# Patient Record
Sex: Female | Born: 1950
Health system: Southern US, Community
[De-identification: ages and names within clinical notes are randomized; demographics above are authoritative.]

## PROBLEM LIST (undated history)

## (undated) DIAGNOSIS — H269 Unspecified cataract: Secondary | ICD-10-CM

## (undated) DIAGNOSIS — F419 Anxiety disorder, unspecified: Secondary | ICD-10-CM

## (undated) DIAGNOSIS — K219 Gastro-esophageal reflux disease without esophagitis: Secondary | ICD-10-CM

## (undated) DIAGNOSIS — F329 Major depressive disorder, single episode, unspecified: Secondary | ICD-10-CM

## (undated) DIAGNOSIS — E079 Disorder of thyroid, unspecified: Secondary | ICD-10-CM

## (undated) DIAGNOSIS — K5790 Diverticulosis of intestine, part unspecified, without perforation or abscess without bleeding: Secondary | ICD-10-CM

## (undated) DIAGNOSIS — H04129 Dry eye syndrome of unspecified lacrimal gland: Secondary | ICD-10-CM

## (undated) DIAGNOSIS — D099 Carcinoma in situ, unspecified: Secondary | ICD-10-CM

## (undated) DIAGNOSIS — E042 Nontoxic multinodular goiter: Secondary | ICD-10-CM

## (undated) DIAGNOSIS — I8390 Asymptomatic varicose veins of unspecified lower extremity: Secondary | ICD-10-CM

## (undated) DIAGNOSIS — E039 Hypothyroidism, unspecified: Secondary | ICD-10-CM

## (undated) DIAGNOSIS — I10 Essential (primary) hypertension: Secondary | ICD-10-CM

## (undated) DIAGNOSIS — M1990 Unspecified osteoarthritis, unspecified site: Secondary | ICD-10-CM

## (undated) DIAGNOSIS — E785 Hyperlipidemia, unspecified: Secondary | ICD-10-CM

## (undated) DIAGNOSIS — Z86010 Personal history of colonic polyps: Secondary | ICD-10-CM

## (undated) DIAGNOSIS — D369 Benign neoplasm, unspecified site: Secondary | ICD-10-CM

## (undated) DIAGNOSIS — Z9289 Personal history of other medical treatment: Secondary | ICD-10-CM

## (undated) DIAGNOSIS — G4733 Obstructive sleep apnea (adult) (pediatric): Secondary | ICD-10-CM

## (undated) DIAGNOSIS — F32A Depression, unspecified: Secondary | ICD-10-CM

## (undated) HISTORY — DX: Hyperlipidemia, unspecified: E78.5

## (undated) HISTORY — PX: BREAST EXCISIONAL BIOPSY: SUR124

## (undated) HISTORY — DX: Asymptomatic varicose veins of unspecified lower extremity: I83.90

## (undated) HISTORY — PX: SEPTOPLASTY: SUR1290

## (undated) HISTORY — DX: Disorder of thyroid, unspecified: E07.9

## (undated) HISTORY — DX: Obstructive sleep apnea (adult) (pediatric): G47.33

## (undated) HISTORY — DX: Carcinoma in situ, unspecified: D09.9

## (undated) HISTORY — DX: Diverticulosis of intestine, part unspecified, without perforation or abscess without bleeding: K57.90

## (undated) HISTORY — DX: Hypothyroidism, unspecified: E03.9

## (undated) HISTORY — DX: Benign neoplasm, unspecified site: D36.9

## (undated) HISTORY — DX: Gastro-esophageal reflux disease without esophagitis: K21.9

## (undated) HISTORY — DX: Dry eye syndrome of unspecified lacrimal gland: H04.129

## (undated) HISTORY — PX: OTHER SURGICAL HISTORY: SHX169

## (undated) HISTORY — DX: Major depressive disorder, single episode, unspecified: F32.9

## (undated) HISTORY — PX: EYE SURGERY: SHX253

## (undated) HISTORY — DX: Depression, unspecified: F32.A

## (undated) HISTORY — DX: Unspecified cataract: H26.9

## (undated) HISTORY — DX: Anxiety disorder, unspecified: F41.9

## (undated) HISTORY — DX: Nontoxic multinodular goiter: E04.2

## (undated) HISTORY — DX: Personal history of colonic polyps: Z86.010

---

## 1999-08-14 ENCOUNTER — Emergency Department (HOSPITAL_COMMUNITY): Admission: EM | Admit: 1999-08-14 | Discharge: 1999-08-15 | Payer: Self-pay | Admitting: Emergency Medicine

## 2000-08-02 ENCOUNTER — Encounter: Payer: Self-pay | Admitting: Obstetrics and Gynecology

## 2000-08-02 ENCOUNTER — Ambulatory Visit (HOSPITAL_COMMUNITY): Admission: RE | Admit: 2000-08-02 | Discharge: 2000-08-02 | Payer: Self-pay | Admitting: Obstetrics and Gynecology

## 2001-10-01 HISTORY — PX: CHOLECYSTECTOMY: SHX55

## 2002-05-07 ENCOUNTER — Ambulatory Visit (HOSPITAL_COMMUNITY): Admission: RE | Admit: 2002-05-07 | Discharge: 2002-05-07 | Payer: Self-pay | Admitting: Obstetrics and Gynecology

## 2002-05-07 ENCOUNTER — Encounter: Payer: Self-pay | Admitting: Obstetrics and Gynecology

## 2002-08-12 ENCOUNTER — Ambulatory Visit (HOSPITAL_COMMUNITY): Admission: RE | Admit: 2002-08-12 | Discharge: 2002-08-12 | Payer: Self-pay | Admitting: Gastroenterology

## 2002-08-12 ENCOUNTER — Encounter (INDEPENDENT_AMBULATORY_CARE_PROVIDER_SITE_OTHER): Payer: Self-pay | Admitting: Specialist

## 2002-09-10 ENCOUNTER — Encounter: Payer: Self-pay | Admitting: Gastroenterology

## 2002-09-10 ENCOUNTER — Ambulatory Visit (HOSPITAL_COMMUNITY): Admission: RE | Admit: 2002-09-10 | Discharge: 2002-09-10 | Payer: Self-pay | Admitting: Gastroenterology

## 2002-10-09 ENCOUNTER — Encounter (INDEPENDENT_AMBULATORY_CARE_PROVIDER_SITE_OTHER): Payer: Self-pay | Admitting: *Deleted

## 2002-10-09 ENCOUNTER — Ambulatory Visit (HOSPITAL_COMMUNITY): Admission: RE | Admit: 2002-10-09 | Discharge: 2002-10-10 | Payer: Self-pay | Admitting: Surgery

## 2002-10-09 ENCOUNTER — Encounter: Payer: Self-pay | Admitting: Surgery

## 2003-04-30 ENCOUNTER — Ambulatory Visit (HOSPITAL_BASED_OUTPATIENT_CLINIC_OR_DEPARTMENT_OTHER): Admission: RE | Admit: 2003-04-30 | Discharge: 2003-04-30 | Payer: Self-pay | Admitting: Family Medicine

## 2003-05-10 ENCOUNTER — Encounter: Payer: Self-pay | Admitting: Obstetrics and Gynecology

## 2003-05-10 ENCOUNTER — Ambulatory Visit (HOSPITAL_COMMUNITY): Admission: RE | Admit: 2003-05-10 | Discharge: 2003-05-10 | Payer: Self-pay | Admitting: Obstetrics and Gynecology

## 2004-11-16 ENCOUNTER — Ambulatory Visit (HOSPITAL_COMMUNITY): Admission: RE | Admit: 2004-11-16 | Discharge: 2004-11-16 | Payer: Self-pay | Admitting: Obstetrics and Gynecology

## 2005-03-01 ENCOUNTER — Ambulatory Visit (HOSPITAL_COMMUNITY): Admission: RE | Admit: 2005-03-01 | Discharge: 2005-03-01 | Payer: Self-pay | Admitting: Family Medicine

## 2005-12-26 ENCOUNTER — Ambulatory Visit (HOSPITAL_COMMUNITY): Admission: RE | Admit: 2005-12-26 | Discharge: 2005-12-26 | Payer: Self-pay | Admitting: Obstetrics and Gynecology

## 2006-02-13 ENCOUNTER — Ambulatory Visit (HOSPITAL_COMMUNITY): Payer: Self-pay | Admitting: *Deleted

## 2006-04-09 ENCOUNTER — Ambulatory Visit (HOSPITAL_COMMUNITY): Payer: Self-pay | Admitting: *Deleted

## 2006-08-20 ENCOUNTER — Ambulatory Visit (HOSPITAL_COMMUNITY): Payer: Self-pay | Admitting: *Deleted

## 2006-11-26 ENCOUNTER — Ambulatory Visit (HOSPITAL_COMMUNITY): Payer: Self-pay | Admitting: Psychiatry

## 2007-01-21 ENCOUNTER — Ambulatory Visit (HOSPITAL_COMMUNITY): Admission: RE | Admit: 2007-01-21 | Discharge: 2007-01-21 | Payer: Self-pay | Admitting: Obstetrics and Gynecology

## 2007-02-25 ENCOUNTER — Ambulatory Visit (HOSPITAL_COMMUNITY): Payer: Self-pay | Admitting: *Deleted

## 2007-12-25 ENCOUNTER — Ambulatory Visit (HOSPITAL_COMMUNITY): Payer: Self-pay | Admitting: *Deleted

## 2008-02-24 ENCOUNTER — Ambulatory Visit (HOSPITAL_COMMUNITY): Payer: Self-pay | Admitting: *Deleted

## 2008-03-23 ENCOUNTER — Ambulatory Visit (HOSPITAL_COMMUNITY): Payer: Self-pay | Admitting: *Deleted

## 2008-06-02 ENCOUNTER — Other Ambulatory Visit: Admission: RE | Admit: 2008-06-02 | Discharge: 2008-06-02 | Payer: Self-pay | Admitting: Family Medicine

## 2008-07-09 ENCOUNTER — Ambulatory Visit (HOSPITAL_COMMUNITY): Admission: RE | Admit: 2008-07-09 | Discharge: 2008-07-09 | Payer: Self-pay | Admitting: Family Medicine

## 2008-07-20 ENCOUNTER — Encounter: Admission: RE | Admit: 2008-07-20 | Discharge: 2008-07-20 | Payer: Self-pay | Admitting: Gastroenterology

## 2008-08-05 ENCOUNTER — Ambulatory Visit (HOSPITAL_COMMUNITY): Payer: Self-pay | Admitting: *Deleted

## 2008-10-01 DIAGNOSIS — Z9289 Personal history of other medical treatment: Secondary | ICD-10-CM

## 2008-10-01 HISTORY — DX: Personal history of other medical treatment: Z92.89

## 2009-08-23 ENCOUNTER — Ambulatory Visit (HOSPITAL_COMMUNITY): Admission: RE | Admit: 2009-08-23 | Discharge: 2009-08-23 | Payer: Self-pay | Admitting: Family Medicine

## 2010-03-14 ENCOUNTER — Encounter: Admission: RE | Admit: 2010-03-14 | Discharge: 2010-03-14 | Payer: Self-pay | Admitting: Family Medicine

## 2010-03-15 ENCOUNTER — Encounter: Admission: RE | Admit: 2010-03-15 | Discharge: 2010-03-15 | Payer: Self-pay | Admitting: Family Medicine

## 2010-03-20 ENCOUNTER — Encounter: Admission: RE | Admit: 2010-03-20 | Discharge: 2010-03-20 | Payer: Self-pay | Admitting: Family Medicine

## 2010-10-22 ENCOUNTER — Encounter: Payer: Self-pay | Admitting: Family Medicine

## 2010-10-23 ENCOUNTER — Encounter: Payer: Self-pay | Admitting: Family Medicine

## 2010-10-30 ENCOUNTER — Ambulatory Visit (HOSPITAL_COMMUNITY)
Admission: RE | Admit: 2010-10-30 | Discharge: 2010-10-30 | Payer: Self-pay | Source: Home / Self Care | Attending: Family Medicine | Admitting: Family Medicine

## 2010-11-24 ENCOUNTER — Other Ambulatory Visit: Payer: Self-pay | Admitting: Internal Medicine

## 2010-11-24 DIAGNOSIS — E042 Nontoxic multinodular goiter: Secondary | ICD-10-CM

## 2010-12-11 ENCOUNTER — Ambulatory Visit
Admission: RE | Admit: 2010-12-11 | Discharge: 2010-12-11 | Disposition: A | Discharge status: Home / Self Care | Payer: Medicare Other | Source: Ambulatory Visit | Attending: Internal Medicine | Admitting: Internal Medicine

## 2010-12-11 DIAGNOSIS — E042 Nontoxic multinodular goiter: Secondary | ICD-10-CM

## 2011-02-16 NOTE — Op Note (Signed)
NAME:  ROLLA, Olivia [name]                          ACCOUNT NO.:  [id number]   MEDICAL RECORD NO.:  [id number]                   PATIENT TYPE:  AMB   LOCATION:  ENDO                                 FACILITY:  [facility]   PHYSICIAN:  Anselmo Rod, M.D.               DATE OF BIRTH:  04-18-1951   DATE OF PROCEDURE:  08/12/2002  DATE OF DISCHARGE:                                 OPERATIVE REPORT   PROCEDURE PERFORMED:  Esophagogastroduodenoscopy with biopsies.   ENDOSCOPIST:  Anselmo Rod, M.D.   INSTRUMENT USED:  Olympus video pan endoscope.   INDICATIONS FOR PROCEDURE:  The patient is a 60 year old white female with a  history of abdominal pain, severe reflux and Guaiac positive stools, rule  out peptic ulcer disease, esophagitis, gastritis.   PREPROCEDURE PREPARATION:  Informed consent was obtained from the patient.  The patient was  fasted for eight hours prior to  the procedure. The pre  procedure physical, the patient had stable vital signs, neck was supple,  chest clear to auscultation, the lungs were clear and the abdomen was soft  with normal bowel sounds.   DESCRIPTION OF PROCEDURE:  The patient was placed in the left lateral  decubitus position and sedated with 80 mg of Demerol and 10 mg  of Versed  intravenously. Once the patient was adequately sedated and  maintained on  low flow oxygen and continuous cardiac monitoring, the Olympus video  panendoscope was advanced through the mouth, teeth, over the tongue,  into  the esophagus, and under direct vision, the entire esophagus appeared normal  with no evidence of rings, sphincters, masses, esophagitis or Barrett's  mucosa.   The scope was then advanced into the stomach. Three gastric polyps were seen  in the proximal portion of the stomach. These were biopsied for pathology.  There was moderate diffuse gastritis noted in the gastric folds and the  proximal half of the stomach. The rest of the stomach, including the mid  body and the antrum appeared normal, and so did the proximal small bowel  including the  duodenal bulb.   IMPRESSION:  1. Normal appearing esophagus and proximal small bowel.  2. Three gastric polyps biopsied for pathology to rule out H. pylori.  3. Diffuse gastritis in the proximal half of the stomach.   RECOMMENDATIONS:  1. Continue double dose Nexium.  2.     Await pathology results.  3. Treat with antibiotics if H. pylori present.  4. Outpatient followup  in the next 7 to 10 days for further     recommendations.                                                Anselmo Rod, M.D.    JNM/MEDQ  D:  08/12/2002  T:  08/12/2002  Job:  [id number]   cc:   Dario Guardian, M.D.

## 2011-02-16 NOTE — Op Note (Signed)
Olivia [name]                          ACCOUNT NO.:  [id number]   MEDICAL RECORD NO.:  [id number]                   PATIENT TYPE:  OIB   LOCATION:  2550                                 FACILITY:  [facility]   PHYSICIAN:  Olivia [name], M.D.              DATE OF BIRTH:  05-17-1951   DATE OF PROCEDURE:  10/09/2002  DATE OF DISCHARGE:                                 OPERATIVE REPORT   PREOPERATIVE DIAGNOSIS:  Biliary dyskinesia.   POSTOPERATIVE DIAGNOSIS:  Biliary dyskinesia.   PROCEDURE:  Laparoscopic cholecystectomy with intraoperative cholangiogram.   SURGEON:  Olivia [name], M.D.   ASSISTANT:  Olivia [name]. Olivia [name], M.D.   ANESTHESIA:  General endotracheal anesthesia.   ESTIMATED BLOOD LOSS:  Minimal.   FINDINGS:  The patient was found to have a normal cholangiogram.   PROCEDURE IN DETAIL:  The patient was brought to the operating room and  identified as Olivia [name].  She was placed supine on the operating table  and general anesthesia was induced.  Her abdomen was then prepped and draped  in the usual sterile fashion.  Using a #15 blade, a small vertical incision  was made below the umbilicus.  The incision was carried down through the  fascia which was then opened with a scalpel.  Hemostat was then used to pass  through the peritoneal cavity.  Next, a 0 Vicryl  pursestring suture was  placed around the fascial opening.  The Hasson port was passed through the  opening and insufflation of the abdomen was begun.  Next, a 12-mm port was  placed in the patient's epigastrium and 2 5-mm ports were placed in the  patient's right flank under direct vision.  The gallbladder was then  identified and retracted above the liver bed.  Dissection was then carried  out in the base of the gallbladder.  The cystic artery was found to be  anterior and was clipped twice proximally and once distally and transected  with scissors.  The cystic artery was then identified and clipped once  distally.  It was then partly transected with scissors.  An angiocatheter  was inserted in the right upper quadrant under direct vision.  The  cholangiocatheter was then inserted through the Angiocath and placed into  the cystic duct.  A cholangiogram was then performed under direct  fluoroscopy.  The entire biliary system and common bile duct appeared normal  as well as the duodenum.  At this point, the cholangiocatheter was removed.  The cystic dict was then clipped 3 times proximally and transected with  scissors.  Another bridging vessel was then clipped in the gallbladder fossa  with surgical clips.  This was felt to be in the posterior branch of the  cystic artery.  The gallbladder was then easily dissected free from the  liver bed with the electrocautery.  Once the gallbladder was freed from the  liver bed, the liver  bed was again examined and hemostasis was achieved.  The gallbladder was then removed through the incision at the umbilicus.  The  0 Vicryl was then tied in place, closing the fascial defect.  All ports were  then removed under direct vision and then the abdomen was deflated.  All  incision sites were infiltrated with 0.25% Marcaine and closed with 4-0  Vicryl subcuticular sutures.  Steri-Strips, gauze, and tape were then  applied.  The patient tolerated the procedure well.   All sponge, needle, and instrument counts were correct at the end of the  procedure.  The patient was then extubated in the operating room and taken  in stable condition to the recovery room.                                               Olivia [name], M.D.    DB/MEDQ  D:  10/09/2002  T:  10/09/2002  Job:  [id number]   cc:   Olivia [name], M.D.  [address]  [place]  [place] [address]  Fax: [phone]

## 2011-07-11 ENCOUNTER — Other Ambulatory Visit: Payer: Self-pay | Admitting: Dermatology

## 2011-10-05 DIAGNOSIS — S239XXA Sprain of unspecified parts of thorax, initial encounter: Secondary | ICD-10-CM | POA: Diagnosis not present

## 2011-10-05 DIAGNOSIS — S335XXA Sprain of ligaments of lumbar spine, initial encounter: Secondary | ICD-10-CM | POA: Diagnosis not present

## 2011-10-05 DIAGNOSIS — M9981 Other biomechanical lesions of cervical region: Secondary | ICD-10-CM | POA: Diagnosis not present

## 2011-10-05 DIAGNOSIS — M999 Biomechanical lesion, unspecified: Secondary | ICD-10-CM | POA: Diagnosis not present

## 2011-10-05 DIAGNOSIS — S139XXA Sprain of joints and ligaments of unspecified parts of neck, initial encounter: Secondary | ICD-10-CM | POA: Diagnosis not present

## 2011-10-08 DIAGNOSIS — S335XXA Sprain of ligaments of lumbar spine, initial encounter: Secondary | ICD-10-CM | POA: Diagnosis not present

## 2011-10-08 DIAGNOSIS — S139XXA Sprain of joints and ligaments of unspecified parts of neck, initial encounter: Secondary | ICD-10-CM | POA: Diagnosis not present

## 2011-10-08 DIAGNOSIS — M999 Biomechanical lesion, unspecified: Secondary | ICD-10-CM | POA: Diagnosis not present

## 2011-10-08 DIAGNOSIS — M9981 Other biomechanical lesions of cervical region: Secondary | ICD-10-CM | POA: Diagnosis not present

## 2011-10-08 DIAGNOSIS — S239XXA Sprain of unspecified parts of thorax, initial encounter: Secondary | ICD-10-CM | POA: Diagnosis not present

## 2011-10-12 DIAGNOSIS — S239XXA Sprain of unspecified parts of thorax, initial encounter: Secondary | ICD-10-CM | POA: Diagnosis not present

## 2011-10-12 DIAGNOSIS — S335XXA Sprain of ligaments of lumbar spine, initial encounter: Secondary | ICD-10-CM | POA: Diagnosis not present

## 2011-10-12 DIAGNOSIS — M999 Biomechanical lesion, unspecified: Secondary | ICD-10-CM | POA: Diagnosis not present

## 2011-10-12 DIAGNOSIS — S139XXA Sprain of joints and ligaments of unspecified parts of neck, initial encounter: Secondary | ICD-10-CM | POA: Diagnosis not present

## 2011-10-12 DIAGNOSIS — M9981 Other biomechanical lesions of cervical region: Secondary | ICD-10-CM | POA: Diagnosis not present

## 2011-10-18 DIAGNOSIS — S239XXA Sprain of unspecified parts of thorax, initial encounter: Secondary | ICD-10-CM | POA: Diagnosis not present

## 2011-10-18 DIAGNOSIS — S139XXA Sprain of joints and ligaments of unspecified parts of neck, initial encounter: Secondary | ICD-10-CM | POA: Diagnosis not present

## 2011-10-18 DIAGNOSIS — M9981 Other biomechanical lesions of cervical region: Secondary | ICD-10-CM | POA: Diagnosis not present

## 2011-10-18 DIAGNOSIS — M999 Biomechanical lesion, unspecified: Secondary | ICD-10-CM | POA: Diagnosis not present

## 2011-10-18 DIAGNOSIS — S335XXA Sprain of ligaments of lumbar spine, initial encounter: Secondary | ICD-10-CM | POA: Diagnosis not present

## 2011-10-19 DIAGNOSIS — F331 Major depressive disorder, recurrent, moderate: Secondary | ICD-10-CM | POA: Diagnosis not present

## 2011-10-25 DIAGNOSIS — S239XXA Sprain of unspecified parts of thorax, initial encounter: Secondary | ICD-10-CM | POA: Diagnosis not present

## 2011-10-25 DIAGNOSIS — M999 Biomechanical lesion, unspecified: Secondary | ICD-10-CM | POA: Diagnosis not present

## 2011-10-25 DIAGNOSIS — S335XXA Sprain of ligaments of lumbar spine, initial encounter: Secondary | ICD-10-CM | POA: Diagnosis not present

## 2011-10-25 DIAGNOSIS — M9981 Other biomechanical lesions of cervical region: Secondary | ICD-10-CM | POA: Diagnosis not present

## 2011-10-25 DIAGNOSIS — S139XXA Sprain of joints and ligaments of unspecified parts of neck, initial encounter: Secondary | ICD-10-CM | POA: Diagnosis not present

## 2011-11-12 ENCOUNTER — Other Ambulatory Visit: Payer: Self-pay | Admitting: Internal Medicine

## 2011-11-12 DIAGNOSIS — E042 Nontoxic multinodular goiter: Secondary | ICD-10-CM

## 2011-11-13 DIAGNOSIS — E039 Hypothyroidism, unspecified: Secondary | ICD-10-CM | POA: Diagnosis not present

## 2011-11-13 DIAGNOSIS — N951 Menopausal and female climacteric states: Secondary | ICD-10-CM | POA: Diagnosis not present

## 2011-11-14 DIAGNOSIS — E039 Hypothyroidism, unspecified: Secondary | ICD-10-CM | POA: Diagnosis not present

## 2011-11-14 DIAGNOSIS — N951 Menopausal and female climacteric states: Secondary | ICD-10-CM | POA: Diagnosis not present

## 2011-11-14 DIAGNOSIS — E042 Nontoxic multinodular goiter: Secondary | ICD-10-CM | POA: Diagnosis not present

## 2011-11-14 DIAGNOSIS — F911 Conduct disorder, childhood-onset type: Secondary | ICD-10-CM | POA: Diagnosis not present

## 2011-11-14 DIAGNOSIS — E669 Obesity, unspecified: Secondary | ICD-10-CM | POA: Diagnosis not present

## 2011-11-21 ENCOUNTER — Ambulatory Visit
Admission: RE | Admit: 2011-11-21 | Discharge: 2011-11-21 | Disposition: A | Discharge status: Home / Self Care | Payer: Medicare Other | Source: Ambulatory Visit | Attending: Internal Medicine | Admitting: Internal Medicine

## 2011-11-21 DIAGNOSIS — E042 Nontoxic multinodular goiter: Secondary | ICD-10-CM

## 2011-11-21 DIAGNOSIS — E039 Hypothyroidism, unspecified: Secondary | ICD-10-CM | POA: Diagnosis not present

## 2011-12-06 ENCOUNTER — Emergency Department (HOSPITAL_COMMUNITY)
Admission: EM | Admit: 2011-12-06 | Discharge: 2011-12-06 | Disposition: A | Discharge status: Home / Self Care | Payer: Medicare Other | Attending: Emergency Medicine | Admitting: Emergency Medicine

## 2011-12-06 ENCOUNTER — Emergency Department (HOSPITAL_COMMUNITY): Payer: Medicare Other

## 2011-12-06 ENCOUNTER — Other Ambulatory Visit: Payer: Self-pay

## 2011-12-06 ENCOUNTER — Encounter (HOSPITAL_COMMUNITY): Payer: Self-pay | Admitting: *Deleted

## 2011-12-06 DIAGNOSIS — E876 Hypokalemia: Secondary | ICD-10-CM | POA: Diagnosis not present

## 2011-12-06 DIAGNOSIS — I10 Essential (primary) hypertension: Secondary | ICD-10-CM | POA: Insufficient documentation

## 2011-12-06 DIAGNOSIS — R079 Chest pain, unspecified: Secondary | ICD-10-CM | POA: Insufficient documentation

## 2011-12-06 DIAGNOSIS — R110 Nausea: Secondary | ICD-10-CM | POA: Insufficient documentation

## 2011-12-06 DIAGNOSIS — R072 Precordial pain: Secondary | ICD-10-CM | POA: Diagnosis not present

## 2011-12-06 HISTORY — DX: Essential (primary) hypertension: I10

## 2011-12-06 LAB — BASIC METABOLIC PANEL
BUN: 10 mg/dL (ref 6–23)
CO2: 29 mEq/L (ref 19–32)
Calcium: 9.7 mg/dL (ref 8.4–10.5)
Chloride: 94 mEq/L — ABNORMAL LOW (ref 96–112)
Creatinine, Ser: 0.58 mg/dL (ref 0.50–1.10)
GFR calc Af Amer: >90 mL/min (ref >90)
GFR calc non Af Amer: >90 mL/min (ref >90)
Glucose, Bld: 88 mg/dL (ref 70–99)
Sodium: 132 mEq/L — ABNORMAL LOW (ref 135–145)

## 2011-12-06 LAB — CARDIAC PANEL(CRET KIN+CKTOT+MB+TROPI)
CK, MB: 2.5 ng/mL (ref 0.3–4.0)
Relative Index: INVALID (ref 0.0–2.5)
Total CK: 65 U/L (ref 7–177)
Troponin I: <0.3 ng/mL (ref <0.30)

## 2011-12-06 LAB — CBC
MCV: 88.5 fL (ref 78.0–100.0)
Platelets: 260 10*3/uL (ref 150–400)
RBC: 4.18 MIL/uL (ref 3.87–5.11)
RDW: 14.1 % (ref 11.5–15.5)

## 2011-12-06 MED ORDER — POTASSIUM CHLORIDE CRYS ER 20 MEQ PO TBCR
20.0000 meq | EXTENDED_RELEASE_TABLET | Freq: Once | ORAL | Status: AC
  Administered 2011-12-06: 20 meq via ORAL
  Filled 2011-12-06: qty 1

## 2011-12-06 NOTE — ED Notes (Signed)
Pt states she is under a lot of stress. 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 Pt started to have chest pain last night. Pt states pain woke up her out of her sleep. Pt states she has been nauseated but not emesis. Pt denies any sob

## 2011-12-06 NOTE — Discharge Instructions (Signed)
Chest Pain (Nonspecific) Chest pain has many causes. Your pain could be caused by something serious, such as a heart attack or a blood clot in the lungs. It could also be caused by something less serious, such as a chest bruise or a virus. Follow up with your doctor. More lab tests or other studies may be needed to find the cause of your pain. Most of the time, nonspecific chest pain will improve within 2 to 3 days of rest and mild pain medicine. HOME CARE  For chest bruises, you may put ice on the sore area for 15 to 20 minutes, 3 to 4 times a day. Do this only if it makes you or your child feel better.   Put ice in a plastic bag.   Place a towel between the skin and the bag.   Rest for the next 2 to 3 days.   Go back to work if the pain improves.   See your doctor if the pain lasts longer than 1 to 2 weeks.   Only take medicine as told by your doctor.   Quit smoking if you smoke.  GET HELP RIGHT AWAY IF:   There is more pain or pain that spreads to the arm, neck, jaw, back, or belly (abdomen).   You or your child has shortness of breath.   You or your child coughs more than usual or coughs up blood.   You or your child has very bad back or belly pain, feels sick to his or her stomach (nauseous), or throws up (vomits).   You or your child has very bad weakness.   You or your child passes out (faints).   You or your child has a temperature by mouth above 102 F (38.9 C), not controlled by medicine.  Any of these problems may be serious and may be an emergency. Do not wait to see if the problems will go away. Get medical help right away. Call your local emergency services 911 in [place] Do not drive yourself to the hospital. MAKE SURE YOU:   Understand these instructions.   Will watch this condition.   Will get help right away if you or your child is not doing well or gets worse.  Document Released: 03/05/2008 Document Revised: 09/06/2011 Document Reviewed:  03/05/2008 [employer] Patient Information 2012 [employer]. 

## 2011-12-06 NOTE — ED Provider Notes (Signed)
History     CSN: [id number]  Arrival date & time 12/06/11  1154   First MD Initiated Contact with Patient 12/06/11 1225      Chief Complaint  Patient presents with  . Chest Pain  Pt with h/o HTN, awoke with CP in middle of the night, approx 4am.  Diffuse tightness, substernal.  Non radiating. Had similar episode "a few weeks ago."  But did not seek Tx at that time.   No h/o CAD.  But has had previous cholecystectomy.   Does admit to increasing social stressors, taking care of 90yo mother w/ dementia.  States, "Im just so very tired."  (Consider location/radiation/quality/duration/timing/severity/associated sxs/prior treatment) Patient is a 61 y.o. female presenting with chest pain. The history is provided by the patient.  Chest Pain The chest pain began 6 - 12 hours ago. At its most intense, the pain is at 7/10. The pain is currently at 2/10. The quality of the pain is described as squeezing. The pain does not radiate. Primary symptoms include nausea. Pertinent negatives for primary symptoms include no fever, no shortness of breath, no palpitations and no vomiting.  Pertinent negatives for associated symptoms include no diaphoresis and no numbness. She tried aspirin for the symptoms. Risk factors include lack of exercise.  Pertinent negatives for past medical history include no CAD, no COPD, no CHF, no diabetes, no DVT and no MI.  Pertinent negatives for family medical history include: no CAD in family and no early MI in family.  Procedure history is negative for cardiac catheterization and exercise treadmill test.     Past Medical History  Diagnosis Date  . Hypertension     Past Surgical History  Procedure Date  . Cholecystectomy     No family history on file.  History  Substance Use Topics  . Smoking status: Never Smoker   . Smokeless tobacco: Not on file  . Alcohol Use: No    OB History    Grav Para Term Preterm Abortions TAB SAB Ect Mult Living                   Review of Systems  Constitutional: Negative for fever and diaphoresis.  Respiratory: Negative for shortness of breath.   Cardiovascular: Positive for chest pain. Negative for palpitations.  Gastrointestinal: Positive for nausea. Negative for vomiting.  Neurological: Negative for numbness.  All other systems reviewed and are negative.    Allergies  Penicillins  Home Medications   Current Outpatient Rx  Name Route Sig Dispense Refill  . DULOXETINE HCL 60 MG PO CPEP Oral Take 60 mg by mouth daily.      BP 146/92  Pulse 83  Temp 98 F (36.7 C)  Resp 20  Ht 5\' 6"  (1.676 m)  Wt 205 lb (92.987 kg)  BMI 33.09 kg/m2  SpO2 92%  Physical Exam  Nursing note and vitals reviewed. Constitutional: She is oriented to person, place, and time. She appears well-developed and well-nourished.  HENT:  Head: Normocephalic and atraumatic.  Eyes: Conjunctivae and EOM are normal. Pupils are equal, round, and reactive to light.  Neck: Neck supple.  Cardiovascular: Normal rate and regular rhythm.  Exam reveals no gallop and no friction rub.   No murmur heard. Pulmonary/Chest: Breath sounds normal. She has no wheezes. She has no rales. She exhibits no tenderness.  Abdominal: Soft. Bowel sounds are normal. She exhibits no distension. There is no tenderness. There is no rebound and no guarding.  Musculoskeletal: Normal range of  motion.  Neurological: She is alert and oriented to person, place, and time. No cranial nerve deficit. Coordination normal.  Skin: Skin is warm and dry. No rash noted.  Psychiatric: She has a normal mood and affect.    ED Course  Procedures (including critical care time)   Labs Reviewed  CARDIAC PANEL(CRET KIN+CKTOT+MB+TROPI)  BASIC METABOLIC PANEL  CBC   No results found.   No diagnosis found.    MDM  Pt is seen and examined;  Initial history and physical completed.  Will follow.    Date: 12/06/2011  Rate: 80  Rhythm: normal sinus rhythm  QRS Axis:  normal  Intervals: normal  ST/T Wave abnormalities: nonspecific ST/T changes  Conduction Disutrbances:Incomplete RBBB  Narrative Interpretation:   Old EKG Reviewed: none available      3:03 PM  Feels much better, slight delay of laboratory studies. Remains chest pain-free, jovial, requesting something to drink. Anticipate discharge home with outpatient cardiology referral. If lab studies are negative  Results for orders placed during the hospital encounter of 12/06/11  CBC      Component Value Range   WBC 5.3  4.0 - 10.5 (K/uL)   RBC 4.18  3.87 - 5.11 (MIL/uL)   Hemoglobin 13.0  12.0 - 15.0 (g/dL)   HCT 20.8  02.2 - 33.6 (%)   MCV 88.5  78.0 - 100.0 (fL)   MCH 31.1  26.0 - 34.0 (pg)   MCHC 35.1  30.0 - 36.0 (g/dL)   RDW 12.2  44.9 - 75.3 (%)   Platelets 260  150 - 400 (K/uL)   Dg Chest 2 View  12/06/2011  *RADIOLOGY REPORT*  Clinical Data: Chest pain  CHEST - 2 VIEW  Comparison: None.  Findings: Normal heart size.  Tortuous aorta.  Clear lungs.  No pneumothorax or pleural effusion.  Minimal T8 compression deformity has a chronic appearance.  IMPRESSION: No active cardiopulmonary disease.  Original Report Authenticated By: Donavan Burnet, M.D.   US Soft Tissue Head/neck  11/21/2011  *RADIOLOGY REPORT*  Clinical Data: Nontoxic multinodular goiter, hypothyroidism, follow- up  THYROID ULTRASOUND  Technique: Ultrasound examination of the thyroid gland and adjacent soft tissues was performed.  Comparison:  Ultrasound of the thyroid of 12/11/2010  Findings:  Right thyroid lobe:  4.3 x 1.5 x 1.2 cm.  (Previously 3.8 x 1.2 x 1.7 cm). Left thyroid lobe:  2.1 x 0.4 x 0.9 cm.  (Previously 1.8 x 0.2 x 0.6 cm). Isthmus:  2 mm in thickness.  Focal nodules:  The echogenicity of the thyroid tissue is slightly inhomogeneous.  Only small nodules are present on the right of no more than 7 mm in diameter.  No enlarging nodule is seen.  Lymphadenopathy:  None visualized.  IMPRESSION: The thyroid gland is  again noted to be small and inhomogeneous with only small nodules of no more than 7 mm in diameter in the right lobe.  Original Report Authenticated By: Juline Patch, M.D.      Results for orders placed during the hospital encounter of 12/06/11  CARDIAC PANEL(CRET KIN+CKTOT+MB+TROPI)      Component Value Range   Total CK 65  7 - 177 (U/L)   CK, MB 2.5  0.3 - 4.0 (ng/mL)   Troponin I <0.30  <0.30 (ng/mL)   Relative Index RELATIVE INDEX IS INVALID  0.0 - 2.5   BASIC METABOLIC PANEL      Component Value Range   Sodium 132 (*) 135 - 145 (mEq/L)   Potassium  3.3 (*) 3.5 - 5.1 (mEq/L)   Chloride 94 (*) 96 - 112 (mEq/L)   CO2 29  19 - 32 (mEq/L)   Glucose, Bld 88  70 - 99 (mg/dL)   BUN 10  6 - 23 (mg/dL)   Creatinine, Ser 1.61  0.50 - 1.10 (mg/dL)   Calcium 9.7  8.4 - 09.6 (mg/dL)   GFR calc non Af Amer >90  >90 (mL/min)   GFR calc Af Amer >90  >90 (mL/min)  CBC      Component Value Range   WBC 5.3  4.0 - 10.5 (K/uL)   RBC 4.18  3.87 - 5.11 (MIL/uL)   Hemoglobin 13.0  12.0 - 15.0 (g/dL)   HCT 04.5  40.9 - 81.1 (%)   MCV 88.5  78.0 - 100.0 (fL)   MCH 31.1  26.0 - 34.0 (pg)   MCHC 35.1  30.0 - 36.0 (g/dL)   RDW 91.4  78.2 - 95.6 (%)   Platelets 260  150 - 400 (K/uL)   Dg Chest 2 View  12/06/2011  *RADIOLOGY REPORT*  Clinical Data: Chest pain  CHEST - 2 VIEW  Comparison: None.  Findings: Normal heart size.  Tortuous aorta.  Clear lungs.  No pneumothorax or pleural effusion.  Minimal T8 compression deformity has a chronic appearance.  IMPRESSION: No active cardiopulmonary disease.  Original Report Authenticated By: Donavan Burnet, M.D.   US Soft Tissue Head/neck  11/21/2011  *RADIOLOGY REPORT*  Clinical Data: Nontoxic multinodular goiter, hypothyroidism, follow- up  THYROID ULTRASOUND  Technique: Ultrasound examination of the thyroid gland and adjacent soft tissues was performed.  Comparison:  Ultrasound of the thyroid of 12/11/2010  Findings:  Right thyroid lobe:  4.3 x 1.5 x 1.2 cm.   (Previously 3.8 x 1.2 x 1.7 cm). Left thyroid lobe:  2.1 x 0.4 x 0.9 cm.  (Previously 1.8 x 0.2 x 0.6 cm). Isthmus:  2 mm in thickness.  Focal nodules:  The echogenicity of the thyroid tissue is slightly inhomogeneous.  Only small nodules are present on the right of no more than 7 mm in diameter.  No enlarging nodule is seen.  Lymphadenopathy:  None visualized.  IMPRESSION: The thyroid gland is again noted to be small and inhomogeneous with only small nodules of no more than 7 mm in diameter in the right lobe.  Original Report Authenticated By: Juline Patch, M.D.     Remains stable. Slightly low potassium, will replete.  Cardiac markers are normal.  Will be stable for further outpatient followup with cardiologist. May need outpatient stress test. Patient is calm, comfortable, cooperative, and appears quite reliable. Will get referrals. Patient was also encouraged to return to the ED at anytime for any concerns or changing symptoms    Clarence Cogswell A. Patrica Duel, MD 12/06/11 1534

## 2011-12-11 DIAGNOSIS — IMO0002 Reserved for concepts with insufficient information to code with codable children: Secondary | ICD-10-CM | POA: Diagnosis not present

## 2011-12-11 DIAGNOSIS — I10 Essential (primary) hypertension: Secondary | ICD-10-CM | POA: Diagnosis not present

## 2011-12-11 DIAGNOSIS — E039 Hypothyroidism, unspecified: Secondary | ICD-10-CM | POA: Diagnosis not present

## 2011-12-11 DIAGNOSIS — R0789 Other chest pain: Secondary | ICD-10-CM | POA: Diagnosis not present

## 2011-12-11 DIAGNOSIS — M25819 Other specified joint disorders, unspecified shoulder: Secondary | ICD-10-CM | POA: Diagnosis not present

## 2011-12-13 DIAGNOSIS — K219 Gastro-esophageal reflux disease without esophagitis: Secondary | ICD-10-CM | POA: Diagnosis not present

## 2011-12-13 DIAGNOSIS — I10 Essential (primary) hypertension: Secondary | ICD-10-CM | POA: Diagnosis not present

## 2011-12-13 DIAGNOSIS — R079 Chest pain, unspecified: Secondary | ICD-10-CM | POA: Diagnosis not present

## 2011-12-17 DIAGNOSIS — I10 Essential (primary) hypertension: Secondary | ICD-10-CM | POA: Diagnosis not present

## 2011-12-17 DIAGNOSIS — R079 Chest pain, unspecified: Secondary | ICD-10-CM | POA: Diagnosis not present

## 2011-12-21 DIAGNOSIS — I10 Essential (primary) hypertension: Secondary | ICD-10-CM | POA: Diagnosis not present

## 2011-12-21 DIAGNOSIS — R079 Chest pain, unspecified: Secondary | ICD-10-CM | POA: Diagnosis not present

## 2012-01-10 ENCOUNTER — Other Ambulatory Visit (HOSPITAL_COMMUNITY): Payer: Self-pay | Admitting: Family Medicine

## 2012-01-10 DIAGNOSIS — Z1231 Encounter for screening mammogram for malignant neoplasm of breast: Secondary | ICD-10-CM

## 2012-01-15 DIAGNOSIS — M25519 Pain in unspecified shoulder: Secondary | ICD-10-CM | POA: Diagnosis not present

## 2012-01-15 DIAGNOSIS — I10 Essential (primary) hypertension: Secondary | ICD-10-CM | POA: Diagnosis not present

## 2012-01-17 DIAGNOSIS — F331 Major depressive disorder, recurrent, moderate: Secondary | ICD-10-CM | POA: Diagnosis not present

## 2012-01-18 DIAGNOSIS — R079 Chest pain, unspecified: Secondary | ICD-10-CM | POA: Diagnosis not present

## 2012-01-18 DIAGNOSIS — I10 Essential (primary) hypertension: Secondary | ICD-10-CM | POA: Diagnosis not present

## 2012-02-01 DIAGNOSIS — L820 Inflamed seborrheic keratosis: Secondary | ICD-10-CM | POA: Diagnosis not present

## 2012-02-06 ENCOUNTER — Ambulatory Visit (HOSPITAL_COMMUNITY)
Admission: RE | Admit: 2012-02-06 | Discharge: 2012-02-06 | Disposition: A | Discharge status: Home / Self Care | Payer: Medicare Other | Source: Ambulatory Visit | Attending: Family Medicine | Admitting: Family Medicine

## 2012-02-06 DIAGNOSIS — Z1231 Encounter for screening mammogram for malignant neoplasm of breast: Secondary | ICD-10-CM | POA: Diagnosis not present

## 2012-02-12 DIAGNOSIS — F331 Major depressive disorder, recurrent, moderate: Secondary | ICD-10-CM | POA: Diagnosis not present

## 2012-03-27 DIAGNOSIS — L821 Other seborrheic keratosis: Secondary | ICD-10-CM | POA: Diagnosis not present

## 2012-03-27 DIAGNOSIS — D239 Other benign neoplasm of skin, unspecified: Secondary | ICD-10-CM | POA: Diagnosis not present

## 2012-03-27 DIAGNOSIS — L570 Actinic keratosis: Secondary | ICD-10-CM | POA: Diagnosis not present

## 2012-03-27 DIAGNOSIS — L820 Inflamed seborrheic keratosis: Secondary | ICD-10-CM | POA: Diagnosis not present

## 2012-04-07 DIAGNOSIS — Z961 Presence of intraocular lens: Secondary | ICD-10-CM | POA: Diagnosis not present

## 2012-05-01 DIAGNOSIS — F331 Major depressive disorder, recurrent, moderate: Secondary | ICD-10-CM | POA: Diagnosis not present

## 2012-07-14 DIAGNOSIS — F331 Major depressive disorder, recurrent, moderate: Secondary | ICD-10-CM | POA: Diagnosis not present

## 2012-07-16 DIAGNOSIS — E039 Hypothyroidism, unspecified: Secondary | ICD-10-CM | POA: Diagnosis not present

## 2012-07-16 DIAGNOSIS — L259 Unspecified contact dermatitis, unspecified cause: Secondary | ICD-10-CM | POA: Diagnosis not present

## 2012-07-16 DIAGNOSIS — F329 Major depressive disorder, single episode, unspecified: Secondary | ICD-10-CM | POA: Diagnosis not present

## 2012-07-16 DIAGNOSIS — I10 Essential (primary) hypertension: Secondary | ICD-10-CM | POA: Diagnosis not present

## 2012-10-02 DIAGNOSIS — F331 Major depressive disorder, recurrent, moderate: Secondary | ICD-10-CM | POA: Diagnosis not present

## 2012-11-06 ENCOUNTER — Other Ambulatory Visit: Payer: Self-pay | Admitting: Internal Medicine

## 2012-11-06 DIAGNOSIS — E049 Nontoxic goiter, unspecified: Secondary | ICD-10-CM

## 2012-11-17 ENCOUNTER — Other Ambulatory Visit: Payer: Medicare Other

## 2012-11-19 ENCOUNTER — Other Ambulatory Visit: Payer: Self-pay | Admitting: Family Medicine

## 2012-11-19 ENCOUNTER — Other Ambulatory Visit (HOSPITAL_COMMUNITY)
Admission: RE | Admit: 2012-11-19 | Discharge: 2012-11-19 | Disposition: A | Discharge status: Home / Self Care | Payer: Medicare Other | Source: Ambulatory Visit | Attending: Family Medicine | Admitting: Family Medicine

## 2012-11-19 DIAGNOSIS — Z124 Encounter for screening for malignant neoplasm of cervix: Secondary | ICD-10-CM | POA: Diagnosis not present

## 2012-11-19 DIAGNOSIS — R5381 Other malaise: Secondary | ICD-10-CM | POA: Diagnosis not present

## 2012-11-19 DIAGNOSIS — R5383 Other fatigue: Secondary | ICD-10-CM | POA: Diagnosis not present

## 2012-11-19 DIAGNOSIS — I10 Essential (primary) hypertension: Secondary | ICD-10-CM | POA: Diagnosis not present

## 2012-11-19 DIAGNOSIS — E538 Deficiency of other specified B group vitamins: Secondary | ICD-10-CM | POA: Diagnosis not present

## 2012-11-19 DIAGNOSIS — F329 Major depressive disorder, single episode, unspecified: Secondary | ICD-10-CM | POA: Diagnosis not present

## 2012-11-19 DIAGNOSIS — Z0000 Encounter for general adult medical examination without abnormal findings: Secondary | ICD-10-CM | POA: Diagnosis not present

## 2012-11-19 DIAGNOSIS — E785 Hyperlipidemia, unspecified: Secondary | ICD-10-CM | POA: Diagnosis not present

## 2012-11-19 DIAGNOSIS — E039 Hypothyroidism, unspecified: Secondary | ICD-10-CM | POA: Diagnosis not present

## 2012-11-19 DIAGNOSIS — Z1331 Encounter for screening for depression: Secondary | ICD-10-CM | POA: Diagnosis not present

## 2012-11-20 ENCOUNTER — Ambulatory Visit
Admission: RE | Admit: 2012-11-20 | Discharge: 2012-11-20 | Disposition: A | Discharge status: Home / Self Care | Payer: Medicare Other | Source: Ambulatory Visit | Attending: Internal Medicine | Admitting: Internal Medicine

## 2012-11-20 DIAGNOSIS — E049 Nontoxic goiter, unspecified: Secondary | ICD-10-CM

## 2012-11-20 DIAGNOSIS — E042 Nontoxic multinodular goiter: Secondary | ICD-10-CM | POA: Diagnosis not present

## 2012-11-21 DIAGNOSIS — F331 Major depressive disorder, recurrent, moderate: Secondary | ICD-10-CM | POA: Diagnosis not present

## 2012-11-28 DIAGNOSIS — R5381 Other malaise: Secondary | ICD-10-CM | POA: Diagnosis not present

## 2012-11-28 DIAGNOSIS — R5383 Other fatigue: Secondary | ICD-10-CM | POA: Diagnosis not present

## 2012-11-28 DIAGNOSIS — E042 Nontoxic multinodular goiter: Secondary | ICD-10-CM | POA: Diagnosis not present

## 2012-11-28 DIAGNOSIS — E039 Hypothyroidism, unspecified: Secondary | ICD-10-CM | POA: Diagnosis not present

## 2012-12-03 ENCOUNTER — Other Ambulatory Visit: Payer: Self-pay | Admitting: Gastroenterology

## 2012-12-03 DIAGNOSIS — Z860100 Personal history of colon polyps, unspecified: Secondary | ICD-10-CM

## 2012-12-03 DIAGNOSIS — D126 Benign neoplasm of colon, unspecified: Secondary | ICD-10-CM | POA: Diagnosis not present

## 2012-12-03 DIAGNOSIS — K6389 Other specified diseases of intestine: Secondary | ICD-10-CM | POA: Diagnosis not present

## 2012-12-03 DIAGNOSIS — K648 Other hemorrhoids: Secondary | ICD-10-CM | POA: Diagnosis not present

## 2012-12-03 DIAGNOSIS — Z85038 Personal history of other malignant neoplasm of large intestine: Secondary | ICD-10-CM | POA: Diagnosis not present

## 2012-12-03 DIAGNOSIS — K6289 Other specified diseases of anus and rectum: Secondary | ICD-10-CM | POA: Diagnosis not present

## 2012-12-03 DIAGNOSIS — Z09 Encounter for follow-up examination after completed treatment for conditions other than malignant neoplasm: Secondary | ICD-10-CM | POA: Diagnosis not present

## 2012-12-03 HISTORY — DX: Personal history of colonic polyps: Z86.010

## 2012-12-03 HISTORY — DX: Personal history of colon polyps, unspecified: Z86.0100

## 2012-12-22 DIAGNOSIS — F331 Major depressive disorder, recurrent, moderate: Secondary | ICD-10-CM | POA: Diagnosis not present

## 2012-12-31 ENCOUNTER — Encounter (INDEPENDENT_AMBULATORY_CARE_PROVIDER_SITE_OTHER): Payer: Self-pay | Admitting: General Surgery

## 2012-12-31 ENCOUNTER — Ambulatory Visit (INDEPENDENT_AMBULATORY_CARE_PROVIDER_SITE_OTHER): Payer: Medicare Other | Admitting: General Surgery

## 2012-12-31 VITALS — BP 106/76 | HR 107 | Temp 97.8°F | Resp 16 | Ht 67.0 in | Wt 215.8 lb

## 2012-12-31 DIAGNOSIS — K6289 Other specified diseases of anus and rectum: Secondary | ICD-10-CM | POA: Diagnosis not present

## 2012-12-31 NOTE — Patient Instructions (Signed)
Fiber Chart  You should 25-30g of fiber per day and drinking 8 glasses of water to help your bowels move regularly.  In the chart below you can look up how much fiber you are getting in an average day.  If you are not getting enough fiber, you should add a fiber supplement to your diet.  Examples of this include Metamucil, FiberCon and Citrucel.  These can be purchased at your local grocery store or pharmacy.      http://www.[employer].edu/offices/health/nutritioncoach/Ato[employer]/handouts/Fiber.pdf  

## 2012-12-31 NOTE — Progress Notes (Signed)
Chief Complaint  Patient presents with  . New Evaluation    eval anal nodule  . Rectal Problems    HISTORY: Olivia [name] is a 62 y.o. female who presents to the office with ana anal mass seen on colonoscopy.  She denies any pain or bleeding.  She has a minor history of constipation.  She denies any hemorrhoidal disease symptoms.      Past Medical History  Diagnosis Date  . Hypertension       Past Surgical History  Procedure Laterality Date  . Cholecystectomy    . Fibroid adenoma  1989 and [id number]        Current Outpatient Prescriptions  Medication Sig Dispense Refill  . busPIRone (BUSPAR) 15 MG tablet       . calcium-vitamin D (OSCAL WITH D) 500-200 MG-UNIT per tablet Take 1 tablet by mouth daily.      . clonazePAM (KLONOPIN) 1 MG tablet Take 1 mg by mouth 3 (three) times daily as needed. anxiety      . DULoxetine (CYMBALTA) 60 MG capsule Take 60 mg by mouth daily.      Marland Kitchen esomeprazole (NEXIUM) 40 MG capsule Take 40 mg by mouth daily before breakfast.      . liothyronine (CYTOMEL) 25 MCG tablet       . lisinopril-hydrochlorothiazide (PRINZIDE,ZESTORETIC) 20-25 MG per tablet       . methocarbamol (ROBAXIN) 500 MG tablet Take 500 mg by mouth 4 (four) times daily as needed. spasms      . mirtazapine (REMERON) 30 MG tablet Take 45 mg by mouth at bedtime.      . Multiple Vitamin (MULITIVITAMIN WITH MINERALS) TABS Take 1 tablet by mouth daily.      . vitamin C (ASCORBIC ACID) 500 MG tablet Take 500 mg by mouth daily.      Marland Kitchen ibuprofen (ADVIL,MOTRIN) 200 MG tablet Take 400 mg by mouth every 6 (six) hours as needed. pain       No current facility-administered medications for this visit.      Allergies  Allergen Reactions  . Penicillins Other (See Comments)    Unknown childhood allergy      Family History  Problem Relation Age of Onset  . Dementia Mother     History   Social History  . Marital Status: Divorced    Spouse Name: N/A    Number of Children: N/A  . Years of  Education: N/A   Social History Main Topics  . Smoking status: Never Smoker   . Smokeless tobacco: None  . Alcohol Use: No  . Drug Use: No  . Sexually Active: No   Other Topics Concern  . None   Social History Narrative  . None      REVIEW OF SYSTEMS - PERTINENT POSITIVES ONLY: Review of Systems - General ROS: negative for - chills, fever or weight loss Hematological and Lymphatic ROS: negative for - bleeding problems, blood clots or bruising Respiratory ROS: no cough, shortness of breath, or wheezing Cardiovascular ROS: no chest pain or dyspnea on exertion Gastrointestinal ROS: no abdominal pain, change in bowel habits, or black or bloody stools Genito-Urinary ROS: no dysuria, trouble voiding, or hematuria  EXAM: Filed Vitals:   12/31/12 1326  BP: 106/76  Pulse: 107  Temp: 97.8 F (36.6 C)  Resp: 16    General appearance: alert and cooperative Resp: clear to auscultation bilaterally Cardio: regular rate and rhythm GI: soft, non-tender; bowel sounds normal; no masses,  no organomegaly  Procedure: Anoscopy Surgeon: Maisie Fus Diagnosis: anal mass  Assistant: Christella Scheuermann After the risks and benefits were explained, verbal consent was obtained for above procedure  Anesthesia: none Findings: anterior canal hypertrophied anal papilla, no fissures, fistula or inflamed hemorrhoids present. Mild internal hemorrhoids    ASSESSMENT AND PLAN: Olivia [name] is a 62 y.o. F who was found to have an anal canal mass on colonoscopy.  On exam this appears to be a hypertrophied anal papilla.  It is not causing her any problems at this time.  We discussed that these are usually due to anal canal irritation.  I recommended that she start a high fiber diet to help prevent any more of these problems in the future.  She will return to me if she develops any anal symptoms, and I will see her back PRN.    Vanita Panda, MD Colon and Rectal Surgery / General Surgery [facility]      Visit Diagnoses: 1. Hypertrophied anal papilla     Primary Care Physician: Allean Found, MD

## 2013-01-08 ENCOUNTER — Encounter (INDEPENDENT_AMBULATORY_CARE_PROVIDER_SITE_OTHER): Payer: Self-pay

## 2013-01-22 DIAGNOSIS — F331 Major depressive disorder, recurrent, moderate: Secondary | ICD-10-CM | POA: Diagnosis not present

## 2013-02-13 DIAGNOSIS — N390 Urinary tract infection, site not specified: Secondary | ICD-10-CM | POA: Diagnosis not present

## 2013-02-26 DIAGNOSIS — F331 Major depressive disorder, recurrent, moderate: Secondary | ICD-10-CM | POA: Diagnosis not present

## 2013-02-27 ENCOUNTER — Other Ambulatory Visit (HOSPITAL_COMMUNITY): Payer: Self-pay | Admitting: Family Medicine

## 2013-02-27 DIAGNOSIS — Z1231 Encounter for screening mammogram for malignant neoplasm of breast: Secondary | ICD-10-CM

## 2013-03-03 DIAGNOSIS — Z961 Presence of intraocular lens: Secondary | ICD-10-CM | POA: Diagnosis not present

## 2013-03-03 DIAGNOSIS — H57059 Tonic pupil, unspecified eye: Secondary | ICD-10-CM | POA: Diagnosis not present

## 2013-03-03 DIAGNOSIS — H04129 Dry eye syndrome of unspecified lacrimal gland: Secondary | ICD-10-CM | POA: Diagnosis not present

## 2013-03-05 ENCOUNTER — Ambulatory Visit (HOSPITAL_COMMUNITY): Payer: Medicare Other

## 2013-03-23 DIAGNOSIS — F331 Major depressive disorder, recurrent, moderate: Secondary | ICD-10-CM | POA: Diagnosis not present

## 2013-04-01 DIAGNOSIS — L821 Other seborrheic keratosis: Secondary | ICD-10-CM | POA: Diagnosis not present

## 2013-04-01 DIAGNOSIS — L819 Disorder of pigmentation, unspecified: Secondary | ICD-10-CM | POA: Diagnosis not present

## 2013-04-01 DIAGNOSIS — D1801 Hemangioma of skin and subcutaneous tissue: Secondary | ICD-10-CM | POA: Diagnosis not present

## 2013-04-01 DIAGNOSIS — I789 Disease of capillaries, unspecified: Secondary | ICD-10-CM | POA: Diagnosis not present

## 2013-04-01 DIAGNOSIS — D239 Other benign neoplasm of skin, unspecified: Secondary | ICD-10-CM | POA: Diagnosis not present

## 2013-04-01 DIAGNOSIS — L820 Inflamed seborrheic keratosis: Secondary | ICD-10-CM | POA: Diagnosis not present

## 2013-04-07 DIAGNOSIS — F331 Major depressive disorder, recurrent, moderate: Secondary | ICD-10-CM | POA: Diagnosis not present

## 2013-04-21 DIAGNOSIS — I10 Essential (primary) hypertension: Secondary | ICD-10-CM | POA: Diagnosis not present

## 2013-05-18 DIAGNOSIS — F331 Major depressive disorder, recurrent, moderate: Secondary | ICD-10-CM | POA: Diagnosis not present

## 2013-06-16 DIAGNOSIS — F331 Major depressive disorder, recurrent, moderate: Secondary | ICD-10-CM | POA: Diagnosis not present

## 2013-06-19 ENCOUNTER — Ambulatory Visit (HOSPITAL_COMMUNITY)
Admission: RE | Admit: 2013-06-19 | Discharge: 2013-06-19 | Disposition: A | Discharge status: Home / Self Care | Payer: Medicare Other | Source: Ambulatory Visit | Attending: Family Medicine | Admitting: Family Medicine

## 2013-06-19 DIAGNOSIS — Z1231 Encounter for screening mammogram for malignant neoplasm of breast: Secondary | ICD-10-CM | POA: Insufficient documentation

## 2013-07-24 DIAGNOSIS — L259 Unspecified contact dermatitis, unspecified cause: Secondary | ICD-10-CM | POA: Diagnosis not present

## 2013-09-08 DIAGNOSIS — F331 Major depressive disorder, recurrent, moderate: Secondary | ICD-10-CM | POA: Diagnosis not present

## 2013-10-01 DIAGNOSIS — D099 Carcinoma in situ, unspecified: Secondary | ICD-10-CM

## 2013-10-01 HISTORY — DX: Carcinoma in situ, unspecified: D09.9

## 2013-10-05 DIAGNOSIS — E042 Nontoxic multinodular goiter: Secondary | ICD-10-CM | POA: Diagnosis not present

## 2013-10-13 DIAGNOSIS — M79609 Pain in unspecified limb: Secondary | ICD-10-CM | POA: Diagnosis not present

## 2013-10-13 DIAGNOSIS — R002 Palpitations: Secondary | ICD-10-CM | POA: Diagnosis not present

## 2013-10-13 DIAGNOSIS — M545 Low back pain, unspecified: Secondary | ICD-10-CM | POA: Diagnosis not present

## 2013-10-13 DIAGNOSIS — I10 Essential (primary) hypertension: Secondary | ICD-10-CM | POA: Diagnosis not present

## 2013-10-14 ENCOUNTER — Ambulatory Visit
Admission: RE | Admit: 2013-10-14 | Discharge: 2013-10-14 | Disposition: A | Discharge status: Home / Self Care | Payer: Medicare Other | Source: Ambulatory Visit | Attending: Family Medicine | Admitting: Family Medicine

## 2013-10-14 ENCOUNTER — Other Ambulatory Visit: Payer: Self-pay | Admitting: Family Medicine

## 2013-10-14 DIAGNOSIS — IMO0002 Reserved for concepts with insufficient information to code with codable children: Secondary | ICD-10-CM | POA: Diagnosis not present

## 2013-10-14 DIAGNOSIS — M79609 Pain in unspecified limb: Secondary | ICD-10-CM

## 2013-10-14 DIAGNOSIS — M1710 Unilateral primary osteoarthritis, unspecified knee: Secondary | ICD-10-CM | POA: Diagnosis not present

## 2013-10-27 DIAGNOSIS — J069 Acute upper respiratory infection, unspecified: Secondary | ICD-10-CM | POA: Diagnosis not present

## 2013-10-27 DIAGNOSIS — H6690 Otitis media, unspecified, unspecified ear: Secondary | ICD-10-CM | POA: Diagnosis not present

## 2013-11-10 DIAGNOSIS — F331 Major depressive disorder, recurrent, moderate: Secondary | ICD-10-CM | POA: Diagnosis not present

## 2013-11-12 DIAGNOSIS — R000 Tachycardia, unspecified: Secondary | ICD-10-CM | POA: Diagnosis not present

## 2013-11-18 DIAGNOSIS — J069 Acute upper respiratory infection, unspecified: Secondary | ICD-10-CM | POA: Diagnosis not present

## 2013-11-18 DIAGNOSIS — K140 Glossitis: Secondary | ICD-10-CM | POA: Diagnosis not present

## 2013-11-18 DIAGNOSIS — R059 Cough, unspecified: Secondary | ICD-10-CM | POA: Diagnosis not present

## 2013-11-18 DIAGNOSIS — R05 Cough: Secondary | ICD-10-CM | POA: Diagnosis not present

## 2014-01-05 DIAGNOSIS — F331 Major depressive disorder, recurrent, moderate: Secondary | ICD-10-CM | POA: Diagnosis not present

## 2014-01-12 DIAGNOSIS — R110 Nausea: Secondary | ICD-10-CM | POA: Diagnosis not present

## 2014-01-12 DIAGNOSIS — I10 Essential (primary) hypertension: Secondary | ICD-10-CM | POA: Diagnosis not present

## 2014-03-03 ENCOUNTER — Other Ambulatory Visit: Payer: Self-pay | Admitting: Internal Medicine

## 2014-03-03 DIAGNOSIS — E042 Nontoxic multinodular goiter: Secondary | ICD-10-CM

## 2014-03-03 DIAGNOSIS — E039 Hypothyroidism, unspecified: Secondary | ICD-10-CM | POA: Diagnosis not present

## 2014-03-05 ENCOUNTER — Other Ambulatory Visit: Payer: Medicare Other

## 2014-03-12 DIAGNOSIS — H04129 Dry eye syndrome of unspecified lacrimal gland: Secondary | ICD-10-CM | POA: Diagnosis not present

## 2014-04-27 DIAGNOSIS — M545 Low back pain, unspecified: Secondary | ICD-10-CM | POA: Diagnosis not present

## 2014-05-03 DIAGNOSIS — F331 Major depressive disorder, recurrent, moderate: Secondary | ICD-10-CM | POA: Diagnosis not present

## 2014-05-05 ENCOUNTER — Other Ambulatory Visit: Payer: Self-pay | Admitting: Dermatology

## 2014-05-05 DIAGNOSIS — D1801 Hemangioma of skin and subcutaneous tissue: Secondary | ICD-10-CM | POA: Diagnosis not present

## 2014-05-05 DIAGNOSIS — L819 Disorder of pigmentation, unspecified: Secondary | ICD-10-CM | POA: Diagnosis not present

## 2014-05-05 DIAGNOSIS — L570 Actinic keratosis: Secondary | ICD-10-CM | POA: Diagnosis not present

## 2014-05-05 DIAGNOSIS — D0439 Carcinoma in situ of skin of other parts of face: Secondary | ICD-10-CM | POA: Diagnosis not present

## 2014-05-05 DIAGNOSIS — L919 Hypertrophic disorder of the skin, unspecified: Secondary | ICD-10-CM | POA: Diagnosis not present

## 2014-05-05 DIAGNOSIS — L821 Other seborrheic keratosis: Secondary | ICD-10-CM | POA: Diagnosis not present

## 2014-05-05 DIAGNOSIS — I781 Nevus, non-neoplastic: Secondary | ICD-10-CM | POA: Diagnosis not present

## 2014-05-05 DIAGNOSIS — D485 Neoplasm of uncertain behavior of skin: Secondary | ICD-10-CM | POA: Diagnosis not present

## 2014-05-05 DIAGNOSIS — D0430 Carcinoma in situ of skin of unspecified part of face: Secondary | ICD-10-CM | POA: Diagnosis not present

## 2014-05-05 DIAGNOSIS — L820 Inflamed seborrheic keratosis: Secondary | ICD-10-CM | POA: Diagnosis not present

## 2014-05-05 DIAGNOSIS — L909 Atrophic disorder of skin, unspecified: Secondary | ICD-10-CM | POA: Diagnosis not present

## 2014-05-10 DIAGNOSIS — I803 Phlebitis and thrombophlebitis of lower extremities, unspecified: Secondary | ICD-10-CM | POA: Diagnosis not present

## 2014-05-21 ENCOUNTER — Other Ambulatory Visit: Payer: Self-pay | Admitting: *Deleted

## 2014-05-21 DIAGNOSIS — I83893 Varicose veins of bilateral lower extremities with other complications: Secondary | ICD-10-CM

## 2014-05-21 DIAGNOSIS — M79661 Pain in right lower leg: Secondary | ICD-10-CM

## 2014-05-26 DIAGNOSIS — D0439 Carcinoma in situ of skin of other parts of face: Secondary | ICD-10-CM | POA: Diagnosis not present

## 2014-05-26 DIAGNOSIS — D0430 Carcinoma in situ of skin of unspecified part of face: Secondary | ICD-10-CM | POA: Diagnosis not present

## 2014-06-15 ENCOUNTER — Encounter: Payer: Self-pay | Admitting: Vascular Surgery

## 2014-06-16 ENCOUNTER — Encounter: Payer: Self-pay | Admitting: Vascular Surgery

## 2014-06-16 ENCOUNTER — Ambulatory Visit (INDEPENDENT_AMBULATORY_CARE_PROVIDER_SITE_OTHER): Payer: Medicare Other | Admitting: Vascular Surgery

## 2014-06-16 ENCOUNTER — Ambulatory Visit (HOSPITAL_COMMUNITY)
Admission: RE | Admit: 2014-06-16 | Discharge: 2014-06-16 | Disposition: A | Discharge status: Home / Self Care | Payer: Medicare Other | Source: Ambulatory Visit | Attending: Vascular Surgery | Admitting: Vascular Surgery

## 2014-06-16 VITALS — BP 112/84 | HR 79 | Resp 16 | Ht 66.5 in | Wt 237.0 lb

## 2014-06-16 DIAGNOSIS — I83893 Varicose veins of bilateral lower extremities with other complications: Secondary | ICD-10-CM | POA: Insufficient documentation

## 2014-06-16 DIAGNOSIS — M7989 Other specified soft tissue disorders: Secondary | ICD-10-CM | POA: Diagnosis not present

## 2014-06-16 DIAGNOSIS — M79661 Pain in right lower leg: Secondary | ICD-10-CM

## 2014-06-16 DIAGNOSIS — M79609 Pain in unspecified limb: Secondary | ICD-10-CM | POA: Insufficient documentation

## 2014-06-16 DIAGNOSIS — M79604 Pain in right leg: Secondary | ICD-10-CM

## 2014-06-16 NOTE — Progress Notes (Signed)
Patient ID: Olivia [name], female   DOB: [birth year], 63 y.o.   MRN: [id number]  Reason for Consult: VARICOSE VEINS   Referred by Reginia Naas, *  Subjective:     HPI:  Olivia [name] is a 63 y.o. female who states that she's had some pain in her right leg for over a year and a half. The pain is aggravated by being on her feet and relieved somewhat with elevation. I do not get any clear-cut history of claudication or rest pain. She is unaware of any history of DVT or phlebitis. She has not worn compression stockings.  I have reviewed her records from Dr. Jossie Ng office. She previously had some phlebitis which was treated with anti-inflammatories and warm compresses.  She does have a history of benign essential hypertension which is been under good control. She also has a thyroid nodule and a history of hyperlipidemia and hypothyroidism.  Past Medical History  Diagnosis Date  . Hypertension   . Thyroid disease     Nodules   Family History  Problem Relation Age of Onset  . Dementia Mother   . Hypertension Father    Past Surgical History  Procedure Laterality Date  . Fibroid adenoma  1989 and [id number]  . Cholecystectomy  2003    Gall Bladder  . Eye surgery Right     Cataract  . Eye surgery Left     Cataract    Short Social History:  History  Substance Use Topics  . Smoking status: Never Smoker   . Smokeless tobacco: Never Used  . Alcohol Use: No    Allergies  Allergen Reactions  . Penicillins Other (See Comments)    Unknown childhood allergy    Current Outpatient Prescriptions  Medication Sig Dispense Refill  . busPIRone (BUSPAR) 15 MG tablet       . calcium-vitamin D (OSCAL WITH D) 500-200 MG-UNIT per tablet Take 1 tablet by mouth daily.      . clonazePAM (KLONOPIN) 1 MG tablet Take 1 mg by mouth 3 (three) times daily as needed. anxiety      . DULoxetine (CYMBALTA) 60 MG capsule Take 60 mg by mouth daily.      Marland Kitchen esomeprazole (NEXIUM) 40 MG capsule  Take 40 mg by mouth daily before breakfast.      . lisinopril-hydrochlorothiazide (PRINZIDE,ZESTORETIC) 20-25 MG per tablet       . methocarbamol (ROBAXIN) 500 MG tablet Take 500 mg by mouth 4 (four) times daily as needed. spasms      . mirtazapine (REMERON) 30 MG tablet Take 45 mg by mouth at bedtime.      . Multiple Vitamin (MULITIVITAMIN WITH MINERALS) TABS Take 1 tablet by mouth daily.      . vitamin C (ASCORBIC ACID) 500 MG tablet Take 500 mg by mouth daily.      Marland Kitchen ibuprofen (ADVIL,MOTRIN) 200 MG tablet Take 400 mg by mouth every 6 (six) hours as needed. pain      . liothyronine (CYTOMEL) 25 MCG tablet        No current facility-administered medications for this visit.   Review of Systems  Constitutional: Negative for chills and fever.  Eyes: Negative for loss of vision.  Respiratory: Negative for cough and wheezing.  Cardiovascular: Positive for leg swelling. Negative for chest pain, chest tightness, claudication, dyspnea with exertion, orthopnea and palpitations.  GI: Negative for blood in stool and vomiting.  GU: Negative for dysuria and hematuria.  Musculoskeletal: Negative for  leg pain, joint pain and myalgias.  Skin: Negative for rash and wound.  Neurological: Negative for dizziness and speech difficulty.  Hematologic: Negative for bruises/bleeds easily. Psychiatric: Negative for depressed mood.       Objective:  Objective  Filed Vitals:   06/16/14 1027  BP: 112/84  Pulse: 79  Resp: 16  Height: 5' 6.5" (1.689 m)  Weight: 237 lb (107.502 kg)  SpO2: 93%   Body mass index is 37.68 kg/(m^2).  Physical Exam  Constitutional: She is oriented to person, place, and time. She appears well-developed and well-nourished.  HENT:  Head: Normocephalic and atraumatic.  Neck: Neck supple. No JVD present. No thyromegaly present.  Cardiovascular: Normal rate, regular rhythm, normal heart sounds and normal pulses.  Exam reveals no friction rub.   No murmur heard. Pulses:       Posterior tibial pulses are 2+ on the right side, and 2+ on the left side.  Pulmonary/Chest: Breath sounds normal. She has no wheezes. She has no rales.  Abdominal: Soft. Bowel sounds are normal. There is no tenderness.  Musculoskeletal: Normal range of motion. She exhibits no edema.  Lymphadenopathy:    She has no cervical adenopathy.  Neurological: She is alert and oriented to person, place, and time. She has normal strength. No sensory deficit.  Skin: No lesion and no rash noted.  She has some small spider veins bilaterally. I do not appreciate any significant phlebitis in the right leg.  Psychiatric: She has a normal mood and affect.   Data: I reviewed her labs from Dr. Jossie Ng office which show a normal creatinine are normal hemoglobin to  I have independently interpreted her venous duplex scan today. There is no evidence of DVT in the right lower extremity. There is no evidence of superficial thrombophlebitis. She has no reflux in the deep veins on the right. She does have reflux in the greater saphenous vein on the right in the thigh. The short saphenous vein could not be visualized.      Assessment/Plan:    Varicose veins of lower extremities with other complications This patient has significant reflux in the right greater saphenous vein. I think that this may likely explain her symptoms in the right leg. We have discussed the importance of intermittent leg elevation the proper positioning for this. I have written her a prescription for a thigh high compression stockings with a gradient of 20-30 mm of mercury. I've encouraged her to exercise as much as possible. I have encouraged her to avoid prolonged sitting and standing. We have discussed the potential use of water aerobics. We will have her return in 3 months. She is continuing to have symptoms, she could be considered for laser ablation of the right greater saphenous vein.      Angelia Mould MD Vascular and Vein  [facility]

## 2014-06-16 NOTE — Assessment & Plan Note (Signed)
This patient has significant reflux in the right greater saphenous vein. I think that this may likely explain her symptoms in the right leg. We have discussed the importance of intermittent leg elevation the proper positioning for this. I have written her a prescription for a thigh high compression stockings with a gradient of 20-30 mm of mercury. I've encouraged her to exercise as much as possible. I have encouraged her to avoid prolonged sitting and standing. We have discussed the potential use of water aerobics. We will have her return in 3 months. She is continuing to have symptoms, she could be considered for laser ablation of the right greater saphenous vein.

## 2014-06-28 ENCOUNTER — Other Ambulatory Visit (HOSPITAL_COMMUNITY): Payer: Self-pay | Admitting: Family Medicine

## 2014-06-28 DIAGNOSIS — Z1231 Encounter for screening mammogram for malignant neoplasm of breast: Secondary | ICD-10-CM

## 2014-07-02 DIAGNOSIS — M5432 Sciatica, left side: Secondary | ICD-10-CM | POA: Diagnosis not present

## 2014-07-02 DIAGNOSIS — M9903 Segmental and somatic dysfunction of lumbar region: Secondary | ICD-10-CM | POA: Diagnosis not present

## 2014-07-02 DIAGNOSIS — M9905 Segmental and somatic dysfunction of pelvic region: Secondary | ICD-10-CM | POA: Diagnosis not present

## 2014-07-02 DIAGNOSIS — M5137 Other intervertebral disc degeneration, lumbosacral region: Secondary | ICD-10-CM | POA: Diagnosis not present

## 2014-07-05 DIAGNOSIS — M9905 Segmental and somatic dysfunction of pelvic region: Secondary | ICD-10-CM | POA: Diagnosis not present

## 2014-07-05 DIAGNOSIS — M5137 Other intervertebral disc degeneration, lumbosacral region: Secondary | ICD-10-CM | POA: Diagnosis not present

## 2014-07-05 DIAGNOSIS — M5432 Sciatica, left side: Secondary | ICD-10-CM | POA: Diagnosis not present

## 2014-07-05 DIAGNOSIS — M9903 Segmental and somatic dysfunction of lumbar region: Secondary | ICD-10-CM | POA: Diagnosis not present

## 2014-07-06 ENCOUNTER — Ambulatory Visit (HOSPITAL_COMMUNITY)
Admission: RE | Admit: 2014-07-06 | Discharge: 2014-07-06 | Disposition: A | Discharge status: Home / Self Care | Payer: Medicare Other | Source: Ambulatory Visit | Attending: Family Medicine | Admitting: Family Medicine

## 2014-07-06 DIAGNOSIS — Z1231 Encounter for screening mammogram for malignant neoplasm of breast: Secondary | ICD-10-CM | POA: Diagnosis not present

## 2014-07-09 DIAGNOSIS — M5137 Other intervertebral disc degeneration, lumbosacral region: Secondary | ICD-10-CM | POA: Diagnosis not present

## 2014-07-09 DIAGNOSIS — M9903 Segmental and somatic dysfunction of lumbar region: Secondary | ICD-10-CM | POA: Diagnosis not present

## 2014-07-09 DIAGNOSIS — M5432 Sciatica, left side: Secondary | ICD-10-CM | POA: Diagnosis not present

## 2014-07-09 DIAGNOSIS — M9905 Segmental and somatic dysfunction of pelvic region: Secondary | ICD-10-CM | POA: Diagnosis not present

## 2014-07-12 DIAGNOSIS — M9905 Segmental and somatic dysfunction of pelvic region: Secondary | ICD-10-CM | POA: Diagnosis not present

## 2014-07-12 DIAGNOSIS — M5137 Other intervertebral disc degeneration, lumbosacral region: Secondary | ICD-10-CM | POA: Diagnosis not present

## 2014-07-12 DIAGNOSIS — M5432 Sciatica, left side: Secondary | ICD-10-CM | POA: Diagnosis not present

## 2014-07-12 DIAGNOSIS — M9903 Segmental and somatic dysfunction of lumbar region: Secondary | ICD-10-CM | POA: Diagnosis not present

## 2014-07-19 DIAGNOSIS — M5432 Sciatica, left side: Secondary | ICD-10-CM | POA: Diagnosis not present

## 2014-07-19 DIAGNOSIS — M5137 Other intervertebral disc degeneration, lumbosacral region: Secondary | ICD-10-CM | POA: Diagnosis not present

## 2014-07-19 DIAGNOSIS — M9903 Segmental and somatic dysfunction of lumbar region: Secondary | ICD-10-CM | POA: Diagnosis not present

## 2014-07-19 DIAGNOSIS — M9905 Segmental and somatic dysfunction of pelvic region: Secondary | ICD-10-CM | POA: Diagnosis not present

## 2014-07-21 DIAGNOSIS — S29012A Strain of muscle and tendon of back wall of thorax, initial encounter: Secondary | ICD-10-CM | POA: Diagnosis not present

## 2014-07-21 DIAGNOSIS — M5432 Sciatica, left side: Secondary | ICD-10-CM | POA: Diagnosis not present

## 2014-07-21 DIAGNOSIS — M47812 Spondylosis without myelopathy or radiculopathy, cervical region: Secondary | ICD-10-CM | POA: Diagnosis not present

## 2014-07-21 DIAGNOSIS — M9903 Segmental and somatic dysfunction of lumbar region: Secondary | ICD-10-CM | POA: Diagnosis not present

## 2014-07-21 DIAGNOSIS — M9901 Segmental and somatic dysfunction of cervical region: Secondary | ICD-10-CM | POA: Diagnosis not present

## 2014-07-21 DIAGNOSIS — M9902 Segmental and somatic dysfunction of thoracic region: Secondary | ICD-10-CM | POA: Diagnosis not present

## 2014-07-23 DIAGNOSIS — M9902 Segmental and somatic dysfunction of thoracic region: Secondary | ICD-10-CM | POA: Diagnosis not present

## 2014-07-23 DIAGNOSIS — M9901 Segmental and somatic dysfunction of cervical region: Secondary | ICD-10-CM | POA: Diagnosis not present

## 2014-07-23 DIAGNOSIS — S29012A Strain of muscle and tendon of back wall of thorax, initial encounter: Secondary | ICD-10-CM | POA: Diagnosis not present

## 2014-07-23 DIAGNOSIS — M47812 Spondylosis without myelopathy or radiculopathy, cervical region: Secondary | ICD-10-CM | POA: Diagnosis not present

## 2014-07-23 DIAGNOSIS — M9903 Segmental and somatic dysfunction of lumbar region: Secondary | ICD-10-CM | POA: Diagnosis not present

## 2014-07-23 DIAGNOSIS — M5432 Sciatica, left side: Secondary | ICD-10-CM | POA: Diagnosis not present

## 2014-07-26 DIAGNOSIS — S29012A Strain of muscle and tendon of back wall of thorax, initial encounter: Secondary | ICD-10-CM | POA: Diagnosis not present

## 2014-07-26 DIAGNOSIS — M47812 Spondylosis without myelopathy or radiculopathy, cervical region: Secondary | ICD-10-CM | POA: Diagnosis not present

## 2014-07-26 DIAGNOSIS — M9901 Segmental and somatic dysfunction of cervical region: Secondary | ICD-10-CM | POA: Diagnosis not present

## 2014-07-26 DIAGNOSIS — F331 Major depressive disorder, recurrent, moderate: Secondary | ICD-10-CM | POA: Diagnosis not present

## 2014-07-26 DIAGNOSIS — M5432 Sciatica, left side: Secondary | ICD-10-CM | POA: Diagnosis not present

## 2014-07-26 DIAGNOSIS — M9903 Segmental and somatic dysfunction of lumbar region: Secondary | ICD-10-CM | POA: Diagnosis not present

## 2014-07-26 DIAGNOSIS — M9902 Segmental and somatic dysfunction of thoracic region: Secondary | ICD-10-CM | POA: Diagnosis not present

## 2014-07-28 DIAGNOSIS — M47812 Spondylosis without myelopathy or radiculopathy, cervical region: Secondary | ICD-10-CM | POA: Diagnosis not present

## 2014-07-28 DIAGNOSIS — M5432 Sciatica, left side: Secondary | ICD-10-CM | POA: Diagnosis not present

## 2014-07-28 DIAGNOSIS — S29012A Strain of muscle and tendon of back wall of thorax, initial encounter: Secondary | ICD-10-CM | POA: Diagnosis not present

## 2014-07-28 DIAGNOSIS — M9903 Segmental and somatic dysfunction of lumbar region: Secondary | ICD-10-CM | POA: Diagnosis not present

## 2014-07-28 DIAGNOSIS — M9902 Segmental and somatic dysfunction of thoracic region: Secondary | ICD-10-CM | POA: Diagnosis not present

## 2014-07-28 DIAGNOSIS — M9901 Segmental and somatic dysfunction of cervical region: Secondary | ICD-10-CM | POA: Diagnosis not present

## 2014-08-03 DIAGNOSIS — S29012A Strain of muscle and tendon of back wall of thorax, initial encounter: Secondary | ICD-10-CM | POA: Diagnosis not present

## 2014-08-03 DIAGNOSIS — M9903 Segmental and somatic dysfunction of lumbar region: Secondary | ICD-10-CM | POA: Diagnosis not present

## 2014-08-03 DIAGNOSIS — M9901 Segmental and somatic dysfunction of cervical region: Secondary | ICD-10-CM | POA: Diagnosis not present

## 2014-08-03 DIAGNOSIS — M47812 Spondylosis without myelopathy or radiculopathy, cervical region: Secondary | ICD-10-CM | POA: Diagnosis not present

## 2014-08-03 DIAGNOSIS — M9902 Segmental and somatic dysfunction of thoracic region: Secondary | ICD-10-CM | POA: Diagnosis not present

## 2014-08-03 DIAGNOSIS — M5432 Sciatica, left side: Secondary | ICD-10-CM | POA: Diagnosis not present

## 2014-08-05 DIAGNOSIS — M9902 Segmental and somatic dysfunction of thoracic region: Secondary | ICD-10-CM | POA: Diagnosis not present

## 2014-08-05 DIAGNOSIS — S29012A Strain of muscle and tendon of back wall of thorax, initial encounter: Secondary | ICD-10-CM | POA: Diagnosis not present

## 2014-08-05 DIAGNOSIS — M5432 Sciatica, left side: Secondary | ICD-10-CM | POA: Diagnosis not present

## 2014-08-05 DIAGNOSIS — M9901 Segmental and somatic dysfunction of cervical region: Secondary | ICD-10-CM | POA: Diagnosis not present

## 2014-08-05 DIAGNOSIS — M9903 Segmental and somatic dysfunction of lumbar region: Secondary | ICD-10-CM | POA: Diagnosis not present

## 2014-08-05 DIAGNOSIS — M47812 Spondylosis without myelopathy or radiculopathy, cervical region: Secondary | ICD-10-CM | POA: Diagnosis not present

## 2014-08-12 DIAGNOSIS — M9901 Segmental and somatic dysfunction of cervical region: Secondary | ICD-10-CM | POA: Diagnosis not present

## 2014-08-12 DIAGNOSIS — S29012A Strain of muscle and tendon of back wall of thorax, initial encounter: Secondary | ICD-10-CM | POA: Diagnosis not present

## 2014-08-12 DIAGNOSIS — M5432 Sciatica, left side: Secondary | ICD-10-CM | POA: Diagnosis not present

## 2014-08-12 DIAGNOSIS — M9903 Segmental and somatic dysfunction of lumbar region: Secondary | ICD-10-CM | POA: Diagnosis not present

## 2014-08-12 DIAGNOSIS — M47812 Spondylosis without myelopathy or radiculopathy, cervical region: Secondary | ICD-10-CM | POA: Diagnosis not present

## 2014-08-12 DIAGNOSIS — M9902 Segmental and somatic dysfunction of thoracic region: Secondary | ICD-10-CM | POA: Diagnosis not present

## 2014-08-17 DIAGNOSIS — S29012A Strain of muscle and tendon of back wall of thorax, initial encounter: Secondary | ICD-10-CM | POA: Diagnosis not present

## 2014-08-17 DIAGNOSIS — M9901 Segmental and somatic dysfunction of cervical region: Secondary | ICD-10-CM | POA: Diagnosis not present

## 2014-08-17 DIAGNOSIS — S39012A Strain of muscle, fascia and tendon of lower back, initial encounter: Secondary | ICD-10-CM | POA: Diagnosis not present

## 2014-08-17 DIAGNOSIS — M9903 Segmental and somatic dysfunction of lumbar region: Secondary | ICD-10-CM | POA: Diagnosis not present

## 2014-08-17 DIAGNOSIS — M47812 Spondylosis without myelopathy or radiculopathy, cervical region: Secondary | ICD-10-CM | POA: Diagnosis not present

## 2014-08-17 DIAGNOSIS — M9902 Segmental and somatic dysfunction of thoracic region: Secondary | ICD-10-CM | POA: Diagnosis not present

## 2014-08-18 DIAGNOSIS — M542 Cervicalgia: Secondary | ICD-10-CM | POA: Diagnosis not present

## 2014-08-23 DIAGNOSIS — M9903 Segmental and somatic dysfunction of lumbar region: Secondary | ICD-10-CM | POA: Diagnosis not present

## 2014-08-23 DIAGNOSIS — M9902 Segmental and somatic dysfunction of thoracic region: Secondary | ICD-10-CM | POA: Diagnosis not present

## 2014-08-23 DIAGNOSIS — M47812 Spondylosis without myelopathy or radiculopathy, cervical region: Secondary | ICD-10-CM | POA: Diagnosis not present

## 2014-08-23 DIAGNOSIS — S39012A Strain of muscle, fascia and tendon of lower back, initial encounter: Secondary | ICD-10-CM | POA: Diagnosis not present

## 2014-08-23 DIAGNOSIS — S29012A Strain of muscle and tendon of back wall of thorax, initial encounter: Secondary | ICD-10-CM | POA: Diagnosis not present

## 2014-08-23 DIAGNOSIS — M9901 Segmental and somatic dysfunction of cervical region: Secondary | ICD-10-CM | POA: Diagnosis not present

## 2014-08-25 DIAGNOSIS — S29012A Strain of muscle and tendon of back wall of thorax, initial encounter: Secondary | ICD-10-CM | POA: Diagnosis not present

## 2014-08-25 DIAGNOSIS — M9901 Segmental and somatic dysfunction of cervical region: Secondary | ICD-10-CM | POA: Diagnosis not present

## 2014-08-25 DIAGNOSIS — S39012A Strain of muscle, fascia and tendon of lower back, initial encounter: Secondary | ICD-10-CM | POA: Diagnosis not present

## 2014-08-25 DIAGNOSIS — M47812 Spondylosis without myelopathy or radiculopathy, cervical region: Secondary | ICD-10-CM | POA: Diagnosis not present

## 2014-08-25 DIAGNOSIS — M9902 Segmental and somatic dysfunction of thoracic region: Secondary | ICD-10-CM | POA: Diagnosis not present

## 2014-08-25 DIAGNOSIS — M9903 Segmental and somatic dysfunction of lumbar region: Secondary | ICD-10-CM | POA: Diagnosis not present

## 2014-08-30 DIAGNOSIS — M9902 Segmental and somatic dysfunction of thoracic region: Secondary | ICD-10-CM | POA: Diagnosis not present

## 2014-08-30 DIAGNOSIS — S39012A Strain of muscle, fascia and tendon of lower back, initial encounter: Secondary | ICD-10-CM | POA: Diagnosis not present

## 2014-08-30 DIAGNOSIS — S29012A Strain of muscle and tendon of back wall of thorax, initial encounter: Secondary | ICD-10-CM | POA: Diagnosis not present

## 2014-08-30 DIAGNOSIS — M47812 Spondylosis without myelopathy or radiculopathy, cervical region: Secondary | ICD-10-CM | POA: Diagnosis not present

## 2014-08-30 DIAGNOSIS — M9901 Segmental and somatic dysfunction of cervical region: Secondary | ICD-10-CM | POA: Diagnosis not present

## 2014-08-30 DIAGNOSIS — M9903 Segmental and somatic dysfunction of lumbar region: Secondary | ICD-10-CM | POA: Diagnosis not present

## 2014-09-03 DIAGNOSIS — S29012A Strain of muscle and tendon of back wall of thorax, initial encounter: Secondary | ICD-10-CM | POA: Diagnosis not present

## 2014-09-03 DIAGNOSIS — M9901 Segmental and somatic dysfunction of cervical region: Secondary | ICD-10-CM | POA: Diagnosis not present

## 2014-09-03 DIAGNOSIS — M47812 Spondylosis without myelopathy or radiculopathy, cervical region: Secondary | ICD-10-CM | POA: Diagnosis not present

## 2014-09-03 DIAGNOSIS — M9903 Segmental and somatic dysfunction of lumbar region: Secondary | ICD-10-CM | POA: Diagnosis not present

## 2014-09-03 DIAGNOSIS — S39012A Strain of muscle, fascia and tendon of lower back, initial encounter: Secondary | ICD-10-CM | POA: Diagnosis not present

## 2014-09-03 DIAGNOSIS — M9902 Segmental and somatic dysfunction of thoracic region: Secondary | ICD-10-CM | POA: Diagnosis not present

## 2014-09-09 DIAGNOSIS — M9902 Segmental and somatic dysfunction of thoracic region: Secondary | ICD-10-CM | POA: Diagnosis not present

## 2014-09-09 DIAGNOSIS — S29012A Strain of muscle and tendon of back wall of thorax, initial encounter: Secondary | ICD-10-CM | POA: Diagnosis not present

## 2014-09-09 DIAGNOSIS — M47812 Spondylosis without myelopathy or radiculopathy, cervical region: Secondary | ICD-10-CM | POA: Diagnosis not present

## 2014-09-09 DIAGNOSIS — M542 Cervicalgia: Secondary | ICD-10-CM | POA: Diagnosis not present

## 2014-09-09 DIAGNOSIS — S39012A Strain of muscle, fascia and tendon of lower back, initial encounter: Secondary | ICD-10-CM | POA: Diagnosis not present

## 2014-09-09 DIAGNOSIS — M9903 Segmental and somatic dysfunction of lumbar region: Secondary | ICD-10-CM | POA: Diagnosis not present

## 2014-09-09 DIAGNOSIS — M9901 Segmental and somatic dysfunction of cervical region: Secondary | ICD-10-CM | POA: Diagnosis not present

## 2014-09-13 DIAGNOSIS — M47812 Spondylosis without myelopathy or radiculopathy, cervical region: Secondary | ICD-10-CM | POA: Diagnosis not present

## 2014-09-13 DIAGNOSIS — M9903 Segmental and somatic dysfunction of lumbar region: Secondary | ICD-10-CM | POA: Diagnosis not present

## 2014-09-13 DIAGNOSIS — S29012A Strain of muscle and tendon of back wall of thorax, initial encounter: Secondary | ICD-10-CM | POA: Diagnosis not present

## 2014-09-13 DIAGNOSIS — M9902 Segmental and somatic dysfunction of thoracic region: Secondary | ICD-10-CM | POA: Diagnosis not present

## 2014-09-13 DIAGNOSIS — S39012A Strain of muscle, fascia and tendon of lower back, initial encounter: Secondary | ICD-10-CM | POA: Diagnosis not present

## 2014-09-13 DIAGNOSIS — M9901 Segmental and somatic dysfunction of cervical region: Secondary | ICD-10-CM | POA: Diagnosis not present

## 2014-09-14 ENCOUNTER — Ambulatory Visit: Payer: Medicare Other | Admitting: Vascular Surgery

## 2014-09-15 DIAGNOSIS — M542 Cervicalgia: Secondary | ICD-10-CM | POA: Diagnosis not present

## 2014-09-16 DIAGNOSIS — M542 Cervicalgia: Secondary | ICD-10-CM | POA: Diagnosis not present

## 2014-09-20 ENCOUNTER — Encounter: Payer: Self-pay | Admitting: Vascular Surgery

## 2014-09-20 DIAGNOSIS — S29012A Strain of muscle and tendon of back wall of thorax, initial encounter: Secondary | ICD-10-CM | POA: Diagnosis not present

## 2014-09-20 DIAGNOSIS — M9902 Segmental and somatic dysfunction of thoracic region: Secondary | ICD-10-CM | POA: Diagnosis not present

## 2014-09-20 DIAGNOSIS — M47812 Spondylosis without myelopathy or radiculopathy, cervical region: Secondary | ICD-10-CM | POA: Diagnosis not present

## 2014-09-20 DIAGNOSIS — S39012A Strain of muscle, fascia and tendon of lower back, initial encounter: Secondary | ICD-10-CM | POA: Diagnosis not present

## 2014-09-20 DIAGNOSIS — M9903 Segmental and somatic dysfunction of lumbar region: Secondary | ICD-10-CM | POA: Diagnosis not present

## 2014-09-20 DIAGNOSIS — M9901 Segmental and somatic dysfunction of cervical region: Secondary | ICD-10-CM | POA: Diagnosis not present

## 2014-09-21 ENCOUNTER — Other Ambulatory Visit: Payer: Self-pay | Admitting: *Deleted

## 2014-09-21 ENCOUNTER — Ambulatory Visit (INDEPENDENT_AMBULATORY_CARE_PROVIDER_SITE_OTHER): Payer: Medicare Other | Admitting: Vascular Surgery

## 2014-09-21 ENCOUNTER — Encounter: Payer: Self-pay | Admitting: Vascular Surgery

## 2014-09-21 VITALS — BP 105/67 | HR 77 | Resp 16 | Ht 66.5 in | Wt 200.0 lb

## 2014-09-21 DIAGNOSIS — I83891 Varicose veins of right lower extremities with other complications: Secondary | ICD-10-CM | POA: Diagnosis not present

## 2014-09-21 DIAGNOSIS — M542 Cervicalgia: Secondary | ICD-10-CM | POA: Diagnosis not present

## 2014-09-21 DIAGNOSIS — F331 Major depressive disorder, recurrent, moderate: Secondary | ICD-10-CM | POA: Diagnosis not present

## 2014-09-21 DIAGNOSIS — I83899 Varicose veins of unspecified lower extremities with other complications: Secondary | ICD-10-CM | POA: Insufficient documentation

## 2014-09-21 NOTE — Progress Notes (Signed)
Subjective:     Patient ID: Olivia [name], female   DOB: 1950/11/30, 63 y.o.   MRN: [id number]  HPI this 63 year old female returns having been evaluated by Dr. Doren Custard 3 months ago for pain and swelling in the right leg. She was found to have gross reflux in the right great saphenous vein. She has tried long-leg elastic compression stockings as well as elevation when she is able to and ibuprofen with no success. She continues to complain of swelling in the lower third of the leg as well as discomfort in the pretibial region below the knee. She has no history of DVT thrombophlebitis or bleeding. Her symptoms are affecting her daily living.  Past Medical History  Diagnosis Date  . Hypertension   . Thyroid disease     Nodules    History  Substance Use Topics  . Smoking status: Never Smoker   . Smokeless tobacco: Never Used  . Alcohol Use: No    Family History  Problem Relation Age of Onset  . Dementia Mother   . Hypertension Father     Allergies  Allergen Reactions  . Penicillins Other (See Comments)    Unknown childhood allergy    Current outpatient prescriptions: busPIRone (BUSPAR) 15 MG tablet, , Disp: , Rfl: ;  calcium-vitamin D (OSCAL WITH D) 500-200 MG-UNIT per tablet, Take 1 tablet by mouth daily., Disp: , Rfl: ;  clonazePAM (KLONOPIN) 1 MG tablet, Take 1 mg by mouth 3 (three) times daily as needed. anxiety, Disp: , Rfl: ;  DULoxetine (CYMBALTA) 60 MG capsule, Take 60 mg by mouth daily., Disp: , Rfl:  esomeprazole (NEXIUM) 40 MG capsule, Take 40 mg by mouth daily before breakfast., Disp: , Rfl: ;  ibuprofen (ADVIL,MOTRIN) 200 MG tablet, Take 400 mg by mouth every 6 (six) hours as needed. pain, Disp: , Rfl: ;  liothyronine (CYTOMEL) 25 MCG tablet, , Disp: , Rfl: ;  lisinopril-hydrochlorothiazide (PRINZIDE,ZESTORETIC) 20-25 MG per tablet, , Disp: , Rfl:  methocarbamol (ROBAXIN) 500 MG tablet, Take 500 mg by mouth 4 (four) times daily as needed. spasms, Disp: , Rfl: ;  mirtazapine  (REMERON) 30 MG tablet, Take 45 mg by mouth at bedtime., Disp: , Rfl: ;  Multiple Vitamin (MULITIVITAMIN WITH MINERALS) TABS, Take 1 tablet by mouth daily., Disp: , Rfl: ;  vitamin C (ASCORBIC ACID) 500 MG tablet, Take 500 mg by mouth daily., Disp: , Rfl:   BP 105/67 mmHg  Pulse 77  Resp 16  Ht 5' 6.5" (1.689 m)  Wt 200 lb (90.719 kg)  BMI 31.80 kg/m2  Body mass index is 31.8 kg/(m^2).           Review of Systems denies chest pain, dyspnea on exertion, PND, orthopnea, hemoptysis.     Objective:   Physical Exam BP 105/67 mmHg  Pulse 77  Resp 16  Ht 5' 6.5" (1.689 m)  Wt 200 lb (90.719 kg)  BMI 31.80 kg/m2  Gen. well-developed well-nourished female in no apparent stress alert and oriented 3 Lungs no rhonchi or wheezing Right leg with 1+ edema and scattered spider veins. No hyperpigmentation or active ulceration is noted. 3+ dorsalis pedis pulse palpable.  Today I reviewed her venous duplex exam which was performed in September 2015 and performed an independent bedside sono site exam. She does have gross reflux which I demonstrated today in the great saphenous vein from the knee proximally.     Assessment:     Right leg pain and edema with gross reflux right  great saphenous vein . Symptoms are affecting patient's daily living and are resistant to conservative measures including long-leg elastic compression stockings 20-30 millimeter gradient, elevation, and ibuprofen    Plan:     Plan laser ablation right great saphenous vein to hopefully improve edema and relieve patient's pain I discussed with her the fact that pain in the leg can have multiple etiologies and did not promise complete relief of pain with this procedure and she understands that

## 2014-09-22 DIAGNOSIS — M542 Cervicalgia: Secondary | ICD-10-CM | POA: Diagnosis not present

## 2014-09-27 ENCOUNTER — Other Ambulatory Visit: Payer: Self-pay | Admitting: *Deleted

## 2014-09-27 DIAGNOSIS — I83891 Varicose veins of right lower extremities with other complications: Secondary | ICD-10-CM

## 2014-10-05 DIAGNOSIS — M542 Cervicalgia: Secondary | ICD-10-CM | POA: Diagnosis not present

## 2014-10-06 DIAGNOSIS — M9903 Segmental and somatic dysfunction of lumbar region: Secondary | ICD-10-CM | POA: Diagnosis not present

## 2014-10-06 DIAGNOSIS — M47812 Spondylosis without myelopathy or radiculopathy, cervical region: Secondary | ICD-10-CM | POA: Diagnosis not present

## 2014-10-06 DIAGNOSIS — M9901 Segmental and somatic dysfunction of cervical region: Secondary | ICD-10-CM | POA: Diagnosis not present

## 2014-10-06 DIAGNOSIS — M9902 Segmental and somatic dysfunction of thoracic region: Secondary | ICD-10-CM | POA: Diagnosis not present

## 2014-10-06 DIAGNOSIS — S39012A Strain of muscle, fascia and tendon of lower back, initial encounter: Secondary | ICD-10-CM | POA: Diagnosis not present

## 2014-10-06 DIAGNOSIS — S29012A Strain of muscle and tendon of back wall of thorax, initial encounter: Secondary | ICD-10-CM | POA: Diagnosis not present

## 2014-10-07 DIAGNOSIS — M542 Cervicalgia: Secondary | ICD-10-CM | POA: Diagnosis not present

## 2014-10-08 DIAGNOSIS — M9901 Segmental and somatic dysfunction of cervical region: Secondary | ICD-10-CM | POA: Diagnosis not present

## 2014-10-08 DIAGNOSIS — S29012A Strain of muscle and tendon of back wall of thorax, initial encounter: Secondary | ICD-10-CM | POA: Diagnosis not present

## 2014-10-08 DIAGNOSIS — M9902 Segmental and somatic dysfunction of thoracic region: Secondary | ICD-10-CM | POA: Diagnosis not present

## 2014-10-08 DIAGNOSIS — S39012A Strain of muscle, fascia and tendon of lower back, initial encounter: Secondary | ICD-10-CM | POA: Diagnosis not present

## 2014-10-08 DIAGNOSIS — M47812 Spondylosis without myelopathy or radiculopathy, cervical region: Secondary | ICD-10-CM | POA: Diagnosis not present

## 2014-10-08 DIAGNOSIS — M9903 Segmental and somatic dysfunction of lumbar region: Secondary | ICD-10-CM | POA: Diagnosis not present

## 2014-10-11 ENCOUNTER — Other Ambulatory Visit: Payer: Medicare Other | Admitting: Vascular Surgery

## 2014-10-11 DIAGNOSIS — S29012A Strain of muscle and tendon of back wall of thorax, initial encounter: Secondary | ICD-10-CM | POA: Diagnosis not present

## 2014-10-11 DIAGNOSIS — S39012A Strain of muscle, fascia and tendon of lower back, initial encounter: Secondary | ICD-10-CM | POA: Diagnosis not present

## 2014-10-11 DIAGNOSIS — M47812 Spondylosis without myelopathy or radiculopathy, cervical region: Secondary | ICD-10-CM | POA: Diagnosis not present

## 2014-10-11 DIAGNOSIS — M9902 Segmental and somatic dysfunction of thoracic region: Secondary | ICD-10-CM | POA: Diagnosis not present

## 2014-10-11 DIAGNOSIS — M9901 Segmental and somatic dysfunction of cervical region: Secondary | ICD-10-CM | POA: Diagnosis not present

## 2014-10-11 DIAGNOSIS — M9903 Segmental and somatic dysfunction of lumbar region: Secondary | ICD-10-CM | POA: Diagnosis not present

## 2014-10-12 DIAGNOSIS — M542 Cervicalgia: Secondary | ICD-10-CM | POA: Diagnosis not present

## 2014-10-18 ENCOUNTER — Encounter (HOSPITAL_COMMUNITY): Payer: Medicare Other

## 2014-10-18 ENCOUNTER — Ambulatory Visit: Payer: Medicare Other | Admitting: Vascular Surgery

## 2014-10-19 ENCOUNTER — Other Ambulatory Visit: Payer: Self-pay | Admitting: Internal Medicine

## 2014-10-19 DIAGNOSIS — E042 Nontoxic multinodular goiter: Secondary | ICD-10-CM

## 2014-10-20 DIAGNOSIS — M542 Cervicalgia: Secondary | ICD-10-CM | POA: Diagnosis not present

## 2014-10-26 DIAGNOSIS — M542 Cervicalgia: Secondary | ICD-10-CM | POA: Diagnosis not present

## 2014-10-26 DIAGNOSIS — S29012A Strain of muscle and tendon of back wall of thorax, initial encounter: Secondary | ICD-10-CM | POA: Diagnosis not present

## 2014-10-26 DIAGNOSIS — M47812 Spondylosis without myelopathy or radiculopathy, cervical region: Secondary | ICD-10-CM | POA: Diagnosis not present

## 2014-10-26 DIAGNOSIS — S39012A Strain of muscle, fascia and tendon of lower back, initial encounter: Secondary | ICD-10-CM | POA: Diagnosis not present

## 2014-10-26 DIAGNOSIS — M9903 Segmental and somatic dysfunction of lumbar region: Secondary | ICD-10-CM | POA: Diagnosis not present

## 2014-10-26 DIAGNOSIS — M9902 Segmental and somatic dysfunction of thoracic region: Secondary | ICD-10-CM | POA: Diagnosis not present

## 2014-10-26 DIAGNOSIS — M9901 Segmental and somatic dysfunction of cervical region: Secondary | ICD-10-CM | POA: Diagnosis not present

## 2014-10-28 DIAGNOSIS — M542 Cervicalgia: Secondary | ICD-10-CM | POA: Diagnosis not present

## 2014-11-01 DIAGNOSIS — S39012A Strain of muscle, fascia and tendon of lower back, initial encounter: Secondary | ICD-10-CM | POA: Diagnosis not present

## 2014-11-01 DIAGNOSIS — M9901 Segmental and somatic dysfunction of cervical region: Secondary | ICD-10-CM | POA: Diagnosis not present

## 2014-11-01 DIAGNOSIS — M47812 Spondylosis without myelopathy or radiculopathy, cervical region: Secondary | ICD-10-CM | POA: Diagnosis not present

## 2014-11-01 DIAGNOSIS — M9902 Segmental and somatic dysfunction of thoracic region: Secondary | ICD-10-CM | POA: Diagnosis not present

## 2014-11-01 DIAGNOSIS — S29012A Strain of muscle and tendon of back wall of thorax, initial encounter: Secondary | ICD-10-CM | POA: Diagnosis not present

## 2014-11-01 DIAGNOSIS — M9903 Segmental and somatic dysfunction of lumbar region: Secondary | ICD-10-CM | POA: Diagnosis not present

## 2014-11-05 ENCOUNTER — Encounter: Payer: Self-pay | Admitting: Vascular Surgery

## 2014-11-08 ENCOUNTER — Ambulatory Visit (INDEPENDENT_AMBULATORY_CARE_PROVIDER_SITE_OTHER): Payer: Medicare Other | Admitting: Vascular Surgery

## 2014-11-08 ENCOUNTER — Encounter: Payer: Self-pay | Admitting: Vascular Surgery

## 2014-11-08 VITALS — BP 103/68 | HR 77 | Resp 16 | Ht 67.0 in | Wt 210.0 lb

## 2014-11-08 DIAGNOSIS — I83891 Varicose veins of right lower extremities with other complications: Secondary | ICD-10-CM

## 2014-11-08 NOTE — Progress Notes (Signed)
Subjective:     Patient ID: Olivia [name], female   DOB: 08-10-1951, 64 y.o.   MRN: [id number]  HPI this 64 year old female had laser ablation of the right great saphenous vein from the distal thigh to near the saphenofemoral junction performed under local tumescent anesthesia or venous hypertension with gross reflux and chronic edema and pain. A total of 1780 J of energy was utilized. She tolerated the procedure well. Review of Systems     Objective:   Physical Exam BP 103/68 mmHg  Pulse 77  Resp 16  Ht 5\' 7"  (1.702 m)  Wt 210 lb (95.255 kg)  BMI 32.88 kg/m2       Assessment:     Well-tolerated laser ablation right great saphenous vein performed under local tumescent anesthesia    Plan:     Return next week for venous duplex exam to confirm closure right great saphenous vein and this will complete patient's treatment regimen

## 2014-11-08 NOTE — Progress Notes (Signed)
     Laser Ablation Procedure    Date: 11/08/2014   Olivia [name] DOB:1951-03-11  Consent signed: Yes    Surgeon:  Dr. Sherren Mocha Early  Procedure: Laser Ablation: right Greater Saphenous Vein  BP 103/68 mmHg  Pulse 77  Resp 16  Ht 5\' 7"  (1.702 m)  Wt 210 lb (95.255 kg)  BMI 32.88 kg/m2  Tumescent Anesthesia: 400 cc 0.9% NaCl with 50 cc Lidocaine HCL with 1% Epi and 15 cc 8.4% NaHCO3  Local Anesthesia: 6 cc Lidocaine HCL and NaHCO3 (ratio 2:1)  Pulsed mode: 15 watts, 500 ms delay, 1.0 duration Total energy: 1783, total pulses: 120, total time: 1:59    Patient tolerated procedure well  Notes:   Description of Procedure:  After marking the course of the secondary varicosities, the patient was placed on the operating table in the supine position, and the right leg was prepped and draped in sterile fashion.   Local anesthetic was administered and under ultrasound guidance the saphenous vein was accessed with a micro needle and guide wire; then the mirco puncture sheath was placed.  A guide wire was inserted saphenofemoral junction , followed by a 5 french sheath.  The position of the sheath and then the laser fiber below the junction was confirmed using the ultrasound.  Tumescent anesthesia was administered along the course of the saphenous vein using ultrasound guidance. The patient was placed in Trendelenburg position and protective laser glasses were placed on patient and staff, and the laser was fired at 15 watts continuous mode advancing 1-34mm/second for a total of 1783 joules.     Steri strips were applied to the stab wounds and ABD pads and thigh high compression stockings were applied.  Ace wrap bandages were applied over the phlebectomy sites and at the top of the saphenofemoral junction. Blood loss was less than 15 cc.  The patient ambulated out of the operating room having tolerated the procedure well.

## 2014-11-09 ENCOUNTER — Telehealth: Payer: Self-pay | Admitting: *Deleted

## 2014-11-09 DIAGNOSIS — M542 Cervicalgia: Secondary | ICD-10-CM | POA: Diagnosis not present

## 2014-11-09 NOTE — Telephone Encounter (Signed)
Pt had an ok night but the stocking is rolling down and bothering her. She will stop by the office later for an new ace wrap so she can re-wrap her leg. Following all instructions.

## 2014-11-11 DIAGNOSIS — M542 Cervicalgia: Secondary | ICD-10-CM | POA: Diagnosis not present

## 2014-11-16 ENCOUNTER — Encounter: Payer: Self-pay | Admitting: Vascular Surgery

## 2014-11-16 DIAGNOSIS — M545 Low back pain: Secondary | ICD-10-CM | POA: Diagnosis not present

## 2014-11-16 DIAGNOSIS — M542 Cervicalgia: Secondary | ICD-10-CM | POA: Diagnosis not present

## 2014-11-17 ENCOUNTER — Encounter: Payer: Self-pay | Admitting: Vascular Surgery

## 2014-11-17 DIAGNOSIS — M9903 Segmental and somatic dysfunction of lumbar region: Secondary | ICD-10-CM | POA: Diagnosis not present

## 2014-11-17 DIAGNOSIS — S29012A Strain of muscle and tendon of back wall of thorax, initial encounter: Secondary | ICD-10-CM | POA: Diagnosis not present

## 2014-11-17 DIAGNOSIS — M9902 Segmental and somatic dysfunction of thoracic region: Secondary | ICD-10-CM | POA: Diagnosis not present

## 2014-11-17 DIAGNOSIS — S39012A Strain of muscle, fascia and tendon of lower back, initial encounter: Secondary | ICD-10-CM | POA: Diagnosis not present

## 2014-11-17 DIAGNOSIS — M47812 Spondylosis without myelopathy or radiculopathy, cervical region: Secondary | ICD-10-CM | POA: Diagnosis not present

## 2014-11-17 DIAGNOSIS — M9901 Segmental and somatic dysfunction of cervical region: Secondary | ICD-10-CM | POA: Diagnosis not present

## 2014-11-18 ENCOUNTER — Encounter: Payer: Self-pay | Admitting: Vascular Surgery

## 2014-11-18 ENCOUNTER — Ambulatory Visit (INDEPENDENT_AMBULATORY_CARE_PROVIDER_SITE_OTHER): Payer: Medicare Other | Admitting: Vascular Surgery

## 2014-11-18 ENCOUNTER — Ambulatory Visit (HOSPITAL_COMMUNITY)
Admission: RE | Admit: 2014-11-18 | Discharge: 2014-11-18 | Disposition: A | Discharge status: Home / Self Care | Payer: Medicare Other | Source: Ambulatory Visit | Attending: Vascular Surgery | Admitting: Vascular Surgery

## 2014-11-18 VITALS — BP 127/84 | HR 79 | Resp 16

## 2014-11-18 DIAGNOSIS — I83891 Varicose veins of right lower extremities with other complications: Secondary | ICD-10-CM | POA: Diagnosis not present

## 2014-11-18 NOTE — Progress Notes (Signed)
Here today for one-week follow-up after laser ablation of her right great saphenous vein with Dr. Kellie Simmering on 11/08/2014. She has done extremely well. She has been compliant with her compression garment. She does report the typical soreness over the ablation site throughout her right thigh.  Past Medical History  Diagnosis Date  . Hypertension   . Thyroid disease     Nodules  . Varicose veins     History  Substance Use Topics  . Smoking status: Never Smoker   . Smokeless tobacco: Never Used  . Alcohol Use: No    Family History  Problem Relation Age of Onset  . Dementia Mother   . Hypertension Father     Allergies  Allergen Reactions  . Penicillins Other (See Comments)    Unknown childhood allergy     Current outpatient prescriptions:  .  busPIRone (BUSPAR) 15 MG tablet, , Disp: , Rfl:  .  calcium-vitamin D (OSCAL WITH D) 500-200 MG-UNIT per tablet, Take 1 tablet by mouth daily., Disp: , Rfl:  .  clonazePAM (KLONOPIN) 1 MG tablet, Take 1 mg by mouth 3 (three) times daily as needed. anxiety, Disp: , Rfl:  .  DULoxetine (CYMBALTA) 60 MG capsule, Take 60 mg by mouth daily., Disp: , Rfl:  .  esomeprazole (NEXIUM) 40 MG capsule, Take 40 mg by mouth daily before breakfast., Disp: , Rfl:  .  ibuprofen (ADVIL,MOTRIN) 200 MG tablet, Take 400 mg by mouth every 6 (six) hours as needed. pain, Disp: , Rfl:  .  liothyronine (CYTOMEL) 25 MCG tablet, , Disp: , Rfl:  .  lisinopril-hydrochlorothiazide (PRINZIDE,ZESTORETIC) 20-25 MG per tablet, , Disp: , Rfl:  .  methocarbamol (ROBAXIN) 500 MG tablet, Take 500 mg by mouth 4 (four) times daily as needed. spasms, Disp: , Rfl:  .  mirtazapine (REMERON) 30 MG tablet, Take 45 mg by mouth at bedtime., Disp: , Rfl:  .  Multiple Vitamin (MULITIVITAMIN WITH MINERALS) TABS, Take 1 tablet by mouth daily., Disp: , Rfl:  .  vitamin C (ASCORBIC ACID) 500 MG tablet, Take 500 mg by mouth daily., Disp: , Rfl:   BP 127/84 mmHg  Pulse 79  Resp 16  There is no  weight on file to calculate BMI.       On physical exam: She does have very mild bruising throughout the medial right thigh. She does have some thickening palpable at the ablation site. No lower extremity swelling.  She underwent venous duplex of her right leg today and I discussed this with patient. This shows successful ablation of her great saphenous vein throughout its treatment course up to 2.5 cm from the saphenofemoral junction. She has no evidence of DVT  Impression and plan: Successful laser ablation of right great saphenous vein. She will wear her compression for total of 2 weeks following the procedure. She will continue her usual activity and will notify should she develop any difficulty in the future

## 2014-11-19 ENCOUNTER — Ambulatory Visit
Admission: RE | Admit: 2014-11-19 | Discharge: 2014-11-19 | Disposition: A | Discharge status: Home / Self Care | Payer: Medicare Other | Source: Ambulatory Visit | Attending: Internal Medicine | Admitting: Internal Medicine

## 2014-11-19 DIAGNOSIS — E042 Nontoxic multinodular goiter: Secondary | ICD-10-CM

## 2014-11-23 DIAGNOSIS — M545 Low back pain: Secondary | ICD-10-CM | POA: Diagnosis not present

## 2014-11-25 DIAGNOSIS — L570 Actinic keratosis: Secondary | ICD-10-CM | POA: Diagnosis not present

## 2014-11-25 DIAGNOSIS — Z85828 Personal history of other malignant neoplasm of skin: Secondary | ICD-10-CM | POA: Diagnosis not present

## 2014-11-25 DIAGNOSIS — L820 Inflamed seborrheic keratosis: Secondary | ICD-10-CM | POA: Diagnosis not present

## 2014-11-26 DIAGNOSIS — M545 Low back pain: Secondary | ICD-10-CM | POA: Diagnosis not present

## 2014-12-02 DIAGNOSIS — M545 Low back pain: Secondary | ICD-10-CM | POA: Diagnosis not present

## 2014-12-02 DIAGNOSIS — M461 Sacroiliitis, not elsewhere classified: Secondary | ICD-10-CM | POA: Diagnosis not present

## 2014-12-07 DIAGNOSIS — S29012A Strain of muscle and tendon of back wall of thorax, initial encounter: Secondary | ICD-10-CM | POA: Diagnosis not present

## 2014-12-07 DIAGNOSIS — M9903 Segmental and somatic dysfunction of lumbar region: Secondary | ICD-10-CM | POA: Diagnosis not present

## 2014-12-07 DIAGNOSIS — M47812 Spondylosis without myelopathy or radiculopathy, cervical region: Secondary | ICD-10-CM | POA: Diagnosis not present

## 2014-12-07 DIAGNOSIS — M9901 Segmental and somatic dysfunction of cervical region: Secondary | ICD-10-CM | POA: Diagnosis not present

## 2014-12-07 DIAGNOSIS — S39012A Strain of muscle, fascia and tendon of lower back, initial encounter: Secondary | ICD-10-CM | POA: Diagnosis not present

## 2014-12-07 DIAGNOSIS — M9902 Segmental and somatic dysfunction of thoracic region: Secondary | ICD-10-CM | POA: Diagnosis not present

## 2014-12-08 DIAGNOSIS — M461 Sacroiliitis, not elsewhere classified: Secondary | ICD-10-CM | POA: Diagnosis not present

## 2014-12-08 DIAGNOSIS — M545 Low back pain: Secondary | ICD-10-CM | POA: Diagnosis not present

## 2014-12-28 DIAGNOSIS — M545 Low back pain: Secondary | ICD-10-CM | POA: Diagnosis not present

## 2015-01-03 DIAGNOSIS — M461 Sacroiliitis, not elsewhere classified: Secondary | ICD-10-CM | POA: Diagnosis not present

## 2015-01-04 DIAGNOSIS — M9902 Segmental and somatic dysfunction of thoracic region: Secondary | ICD-10-CM | POA: Diagnosis not present

## 2015-01-04 DIAGNOSIS — M47812 Spondylosis without myelopathy or radiculopathy, cervical region: Secondary | ICD-10-CM | POA: Diagnosis not present

## 2015-01-04 DIAGNOSIS — M9903 Segmental and somatic dysfunction of lumbar region: Secondary | ICD-10-CM | POA: Diagnosis not present

## 2015-01-04 DIAGNOSIS — S39012A Strain of muscle, fascia and tendon of lower back, initial encounter: Secondary | ICD-10-CM | POA: Diagnosis not present

## 2015-01-04 DIAGNOSIS — M9901 Segmental and somatic dysfunction of cervical region: Secondary | ICD-10-CM | POA: Diagnosis not present

## 2015-01-04 DIAGNOSIS — S29012A Strain of muscle and tendon of back wall of thorax, initial encounter: Secondary | ICD-10-CM | POA: Diagnosis not present

## 2015-01-13 DIAGNOSIS — R300 Dysuria: Secondary | ICD-10-CM | POA: Diagnosis not present

## 2015-01-13 DIAGNOSIS — B029 Zoster without complications: Secondary | ICD-10-CM | POA: Diagnosis not present

## 2015-01-27 DIAGNOSIS — L820 Inflamed seborrheic keratosis: Secondary | ICD-10-CM | POA: Diagnosis not present

## 2015-01-31 DIAGNOSIS — F331 Major depressive disorder, recurrent, moderate: Secondary | ICD-10-CM | POA: Diagnosis not present

## 2015-02-04 ENCOUNTER — Encounter (HOSPITAL_COMMUNITY): Payer: Self-pay | Admitting: Nurse Practitioner

## 2015-02-04 ENCOUNTER — Emergency Department (HOSPITAL_COMMUNITY)
Admission: EM | Admit: 2015-02-04 | Discharge: 2015-02-04 | Disposition: A | Discharge status: Home / Self Care | Payer: Medicare Other | Attending: Emergency Medicine | Admitting: Emergency Medicine

## 2015-02-04 ENCOUNTER — Emergency Department (HOSPITAL_COMMUNITY): Payer: Medicare Other

## 2015-02-04 DIAGNOSIS — Z880 Allergy status to penicillin: Secondary | ICD-10-CM | POA: Insufficient documentation

## 2015-02-04 DIAGNOSIS — R12 Heartburn: Secondary | ICD-10-CM | POA: Insufficient documentation

## 2015-02-04 DIAGNOSIS — Z79899 Other long term (current) drug therapy: Secondary | ICD-10-CM | POA: Insufficient documentation

## 2015-02-04 DIAGNOSIS — E079 Disorder of thyroid, unspecified: Secondary | ICD-10-CM | POA: Insufficient documentation

## 2015-02-04 DIAGNOSIS — R072 Precordial pain: Secondary | ICD-10-CM | POA: Diagnosis not present

## 2015-02-04 DIAGNOSIS — R079 Chest pain, unspecified: Secondary | ICD-10-CM | POA: Diagnosis not present

## 2015-02-04 DIAGNOSIS — F419 Anxiety disorder, unspecified: Secondary | ICD-10-CM | POA: Insufficient documentation

## 2015-02-04 DIAGNOSIS — I10 Essential (primary) hypertension: Secondary | ICD-10-CM | POA: Diagnosis not present

## 2015-02-04 LAB — CBC
HEMATOCRIT: 37.9 % (ref 36.0–46.0)
Hemoglobin: 12.8 g/dL (ref 12.0–15.0)
MCH: 30 pg (ref 26.0–34.0)
MCHC: 33.8 g/dL (ref 30.0–36.0)
MCV: 89 fL (ref 78.0–100.0)
Platelets: 339 10*3/uL (ref 150–400)
RBC: 4.26 MIL/uL (ref 3.87–5.11)
RDW: 14.4 % (ref 11.5–15.5)
WBC: 8.1 10*3/uL (ref 4.0–10.5)

## 2015-02-04 LAB — BASIC METABOLIC PANEL
Anion gap: 12 (ref 5–15)
BUN: 14 mg/dL (ref 6–20)
CALCIUM: 9.6 mg/dL (ref 8.9–10.3)
CO2: 25 mmol/L (ref 22–32)
Chloride: 96 mmol/L — ABNORMAL LOW (ref 101–111)
Creatinine, Ser: 0.79 mg/dL (ref 0.44–1.00)
GFR calc Af Amer: >60 mL/min (ref >60)
Glucose, Bld: 86 mg/dL (ref 70–99)
POTASSIUM: 4 mmol/L (ref 3.5–5.1)
SODIUM: 133 mmol/L — AB (ref 135–145)

## 2015-02-04 LAB — I-STAT TROPONIN, ED: Troponin i, poc: 0 ng/mL (ref 0.00–0.08)

## 2015-02-04 MED ORDER — ASPIRIN 81 MG PO CHEW
324.0000 mg | CHEWABLE_TABLET | Freq: Once | ORAL | Status: AC
  Administered 2015-02-04: 324 mg via ORAL
  Filled 2015-02-04: qty 4

## 2015-02-04 NOTE — Discharge Instructions (Signed)
Your caregiver has diagnosed you as having chest pain that is not specific for one problem, but does not require admission.  You are at low risk for an acute heart condition or other serious illness. Chest pain comes from many different causes.  °SEEK IMMEDIATE MEDICAL ATTENTION IF: °You have severe chest pain, especially if the pain is crushing or pressure-like and spreads to the arms, back, neck, or jaw, or if you have sweating, nausea (feeling sick to your stomach), or shortness of breath. THIS IS AN EMERGENCY. Don't wait to see if the pain will go away. Get medical help at once. Call 911 or 0 (operator). DO NOT drive yourself to the hospital.  °Your chest pain gets worse and does not go away with rest.  °You have an attack of chest pain lasting longer than usual, despite rest and treatment with the medications your caregiver has prescribed.  °You wake from sleep with chest pain or shortness of breath.  °You feel dizzy or faint.  °You have chest pain not typical of your usual pain for which you originally saw your caregiver. °Chest Pain (Nonspecific) °It is often hard to give a specific diagnosis for the cause of chest pain. There is always a chance that your pain could be related to something serious, such as a heart attack or a blood clot in the lungs. You need to follow up with your health care provider for further evaluation. °CAUSES  °· Heartburn. °· Pneumonia or bronchitis. °· Anxiety or stress. °· Inflammation around your heart (pericarditis) or lung (pleuritis or pleurisy). °· A blood clot in the lung. °· A collapsed lung (pneumothorax). It can develop suddenly on its own (spontaneous pneumothorax) or from trauma to the chest. °· Shingles infection (herpes zoster virus). °The chest wall is composed of bones, muscles, and cartilage. Any of these can be the source of the pain. °· The bones can be bruised by injury. °· The muscles or cartilage can be strained by coughing or overwork. °· The cartilage can be  affected by inflammation and become sore (costochondritis). °DIAGNOSIS  °Lab tests or other studies may be needed to find the cause of your pain. Your health care provider may have you take a test called an ambulatory electrocardiogram (ECG). An ECG records your heartbeat patterns over a 24-hour period. You may also have other tests, such as: °· Transthoracic echocardiogram (TTE). During echocardiography, sound waves are used to evaluate how blood flows through your heart. °· Transesophageal echocardiogram (TEE). °· Cardiac monitoring. This allows your health care provider to monitor your heart rate and rhythm in real time. °· Holter monitor. This is a portable device that records your heartbeat and can help diagnose heart arrhythmias. It allows your health care provider to track your heart activity for several days, if needed. °· Stress tests by exercise or by giving medicine that makes the heart beat faster. °TREATMENT  °· Treatment depends on what may be causing your chest pain. Treatment may include: °¨ Acid blockers for heartburn. °¨ Anti-inflammatory medicine. °¨ Pain medicine for inflammatory conditions. °¨ Antibiotics if an infection is present. °· You may be advised to change lifestyle habits. This includes stopping smoking and avoiding alcohol, caffeine, and chocolate. °· You may be advised to keep your head raised (elevated) when sleeping. This reduces the chance of acid going backward from your stomach into your esophagus. °Most of the time, nonspecific chest pain will improve within 2-3 days with rest and mild pain medicine.  °HOME CARE INSTRUCTIONS  °·   If antibiotics were prescribed, take them as directed. Finish them even if you start to feel better. °· For the next few days, avoid physical activities that bring on chest pain. Continue physical activities as directed. °· Do not use any tobacco products, including cigarettes, chewing tobacco, or electronic cigarettes. °· Avoid drinking alcohol. °· Only  take medicine as directed by your health care provider. °· Follow your health care provider's suggestions for further testing if your chest pain does not go away. °· Keep any follow-up appointments you made. If you do not go to an appointment, you could develop lasting (chronic) problems with pain. If there is any problem keeping an appointment, call to reschedule. °SEEK MEDICAL CARE IF:  °· Your chest pain does not go away, even after treatment. °· You have a rash with blisters on your chest. °· You have a fever. °SEEK IMMEDIATE MEDICAL CARE IF:  °· You have increased chest pain or pain that spreads to your arm, neck, jaw, back, or abdomen. °· You have shortness of breath. °· You have an increasing cough, or you cough up blood. °· You have severe back or abdominal pain. °· You feel nauseous or vomit. °· You have severe weakness. °· You faint. °· You have chills. °This is an emergency. Do not wait to see if the pain will go away. Get medical help at once. Call your local emergency services (911 in [place] Do not drive yourself to the hospital. °MAKE SURE YOU:  °· Understand these instructions. °· Will watch your condition. °· Will get help right away if you are not doing well or get worse. °Document Released: 06/27/2005 Document Revised: 09/22/2013 Document Reviewed: 04/22/2008 °[employer]® Patient Information ©2015 [employer]. This information is not intended to replace advice given to you by your health care provider. Make sure you discuss any questions you have with your health care provider. ° °

## 2015-02-04 NOTE — ED Notes (Addendum)
She c/o chest tightness radiating into back while at rest about 1700 today. She took 2 baby aspirin and pain got worse, began to radiate into jaw and so she took 3 more baby aspirin. The pain is better now. She noticed the pain was worse after eating. Denies SOB. She is A&Ox4, resp e/u.

## 2015-02-04 NOTE — ED Provider Notes (Signed)
CSN: [id number]     Arrival date & time 02/04/15  1638 History   First MD Initiated Contact with Patient 02/04/15 1719     Chief Complaint  Patient presents with  . Chest Pain     (Consider location/radiation/quality/duration/timing/severity/associated sxs/prior Treatment) Patient is a 64 y.o. female presenting with chest pain. The history is provided by the patient and medical records.  Chest Pain Pain location:  Substernal area Pain quality: aching   Pain radiates to:  L jaw Pain radiates to the back: no   Pain severity:  Mild Onset quality:  Sudden Duration:  3 minutes Timing:  Intermittent Progression:  Waxing and waning Chronicity:  Recurrent Context: at rest   Context: not breathing, no drug use, not eating, no intercourse, not lifting, no movement, not raising an arm, no stress and no trauma   Context comment:  Usually occurs at night. Relieved by: asa, water. Worsened by:  Nothing tried Associated symptoms: anxiety and heartburn   Associated symptoms: no abdominal pain, no AICD problem, no altered mental status, no anorexia, no back pain, no claudication, no cough, no diaphoresis, no dizziness, no dysphagia, no fatigue, no fever, no headache, no lower extremity edema, no nausea, no near-syncope, no numbness, no orthopnea, no palpitations, no PND, no shortness of breath, no syncope, not vomiting and no weakness   Risk factors: obesity   Risk factors: no coronary artery disease, no diabetes mellitus, no hypertension, no immobilization and no prior DVT/PE     Past Medical History  Diagnosis Date  . Hypertension   . Thyroid disease     Nodules  . Varicose veins    Past Surgical History  Procedure Laterality Date  . Fibroid adenoma  1989 and [id number]  . Cholecystectomy  2003    Gall Bladder  . Eye surgery Right     Cataract  . Eye surgery Left     Cataract   Family History  Problem Relation Age of Onset  . Dementia Mother   . Hypertension Father    History   Substance Use Topics  . Smoking status: Never Smoker   . Smokeless tobacco: Never Used  . Alcohol Use: No   OB History    No data available     Review of Systems  Constitutional: Negative for fever, diaphoresis and fatigue.  HENT: Negative for trouble swallowing.   Respiratory: Negative for cough and shortness of breath.   Cardiovascular: Positive for chest pain. Negative for palpitations, orthopnea, claudication, syncope, PND and near-syncope.  Gastrointestinal: Positive for heartburn. Negative for nausea, vomiting, abdominal pain and anorexia.  Musculoskeletal: Negative for back pain.  Neurological: Negative for dizziness, weakness, numbness and headaches.  Ten systems reviewed and are negative for acute change, except as noted in the HPI.     Allergies  Penicillins  Home Medications   Prior to Admission medications   Medication Sig Start Date End Date Taking? Authorizing Provider  busPIRone (BUSPAR) 15 MG tablet Take 15 mg by mouth 2 (two) times daily.  12/22/12  Yes Historical Provider, MD  calcium-vitamin D (OSCAL WITH D) 500-200 MG-UNIT per tablet Take 1 tablet by mouth daily.   Yes Historical Provider, MD  clonazePAM (KLONOPIN) 1 MG tablet Take 1 mg by mouth 3 (three) times daily as needed. anxiety   Yes Historical Provider, MD  DULoxetine (CYMBALTA) 60 MG capsule Take 60 mg by mouth daily.   Yes Historical Provider, MD  esomeprazole (NEXIUM) 40 MG capsule Take 40 mg by mouth daily  before breakfast.   Yes Historical Provider, MD  ibuprofen (ADVIL,MOTRIN) 200 MG tablet Take 400 mg by mouth every 6 (six) hours as needed. pain   Yes Historical Provider, MD  liothyronine (CYTOMEL) 25 MCG tablet Take 25 mcg by mouth daily.  12/29/12  Yes Historical Provider, MD  lisinopril-hydrochlorothiazide (PRINZIDE,ZESTORETIC) 20-25 MG per tablet Take 1 tablet by mouth daily.  11/24/12  Yes Historical Provider, MD  methocarbamol (ROBAXIN) 500 MG tablet Take 500 mg by mouth 4 (four) times  daily as needed. spasms   Yes Historical Provider, MD  mirtazapine (REMERON) 30 MG tablet Take 45 mg by mouth at bedtime.   Yes Historical Provider, MD  Multiple Vitamin (MULITIVITAMIN WITH MINERALS) TABS Take 1 tablet by mouth daily.   Yes Historical Provider, MD  vitamin C (ASCORBIC ACID) 500 MG tablet Take 500 mg by mouth daily.   Yes Historical Provider, MD   BP 99/62 mmHg  Pulse 72  Temp(Src) 98.3 F (36.8 C) (Oral)  Resp 18  Ht 5\' 7"  (1.702 m)  Wt 230 lb (104.327 kg)  BMI 36.01 kg/m2  SpO2 97% Physical Exam  Constitutional: She is oriented to person, place, and time. She appears well-developed and well-nourished. No distress.  HENT:  Head: Normocephalic and atraumatic.  Eyes: Conjunctivae are normal. No scleral icterus.  Neck: Normal range of motion.  Cardiovascular: Normal rate, regular rhythm and normal heart sounds.  Exam reveals no gallop and no friction rub.   No murmur heard. Pulmonary/Chest: Effort normal and breath sounds normal. No respiratory distress.  Abdominal: Soft. Bowel sounds are normal. She exhibits no distension and no mass. There is no tenderness. There is no guarding.  Neurological: She is alert and oriented to person, place, and time.  Skin: Skin is warm and dry. She is not diaphoretic.  Nursing note and vitals reviewed.   ED Course  Procedures (including critical care time) Labs Review Labs Reviewed  BASIC METABOLIC PANEL - Abnormal; Notable for the following:    Sodium 133 (*)    Chloride 96 (*)    All other components within normal limits  CBC  URINALYSIS, ROUTINE W REFLEX MICROSCOPIC  I-STAT TROPOININ, ED  I-STAT TROPOININ, ED    Imaging Review Dg Chest 2 View  02/04/2015   CLINICAL DATA:  Chest pain with left jaw pain that started last night. Hx of HTN.  EXAM: CHEST  2 VIEW  COMPARISON:  12/06/2011  FINDINGS: Cardiac silhouette is normal in size. Aorta is mildly uncoiled. No mediastinal or hilar masses or evidence of adenopathy.  Lungs are  clear.  No pleural effusion or pneumothorax.  Bony thorax is demineralized but intact.  IMPRESSION: No active cardiopulmonary disease.   Electronically Signed   By: Lajean Manes M.D.   On: 02/04/2015 17:19     EKG Interpretation None      MDM   Final diagnoses:  Chest pain with low risk for cardiac etiology    Patient EKG unchanged form previous.  Her  Chest pain occurs mostly at night while she is lying down. She has a strong history of reflux and is supposed chest pain occurs mostly at night. She has no CP at this time. She is supposed to be taking Nexium BID but has only been taking it in the mornings.  Cardiac RF include age and obesity. Labs are reassuring. Her HEART score is 2.  She had a cardiac stress test in 2013 that was negative. She does not wish to stay for a  delta troponin. I feel she is low risk for MACE.  Patient is to be discharged with recommendation to follow up with PCP in regards to today's hospital visit. Chest pain is not likely of cardiac or pulmonary etiology d/t presentation, Wells low risk , VSS, no tracheal deviation, no JVD or new murmur, RRR, breath sounds equal bilaterally, EKG without acute abnormalities, negative troponin, and negative CXR. Pt has been advised start a PPI and return to the ED is CP becomes exertional, associated with diaphoresis or nausea, radiates to left jaw/arm, worsens or becomes concerning in any way. Pt appears reliable for follow up and is agreeable to discharge.   Case has been discussed with and seen by Dr. Reather Converse who agrees with the above plan to discharge.      Margarita Mail, PA-C 02/04/15 2025  Elnora Morrison, MD 02/05/15 [phone]

## 2015-02-14 DIAGNOSIS — M461 Sacroiliitis, not elsewhere classified: Secondary | ICD-10-CM | POA: Diagnosis not present

## 2015-03-03 DIAGNOSIS — E039 Hypothyroidism, unspecified: Secondary | ICD-10-CM | POA: Diagnosis not present

## 2015-03-16 DIAGNOSIS — M9903 Segmental and somatic dysfunction of lumbar region: Secondary | ICD-10-CM | POA: Diagnosis not present

## 2015-03-16 DIAGNOSIS — S29012A Strain of muscle and tendon of back wall of thorax, initial encounter: Secondary | ICD-10-CM | POA: Diagnosis not present

## 2015-03-16 DIAGNOSIS — M9901 Segmental and somatic dysfunction of cervical region: Secondary | ICD-10-CM | POA: Diagnosis not present

## 2015-03-16 DIAGNOSIS — S39012A Strain of muscle, fascia and tendon of lower back, initial encounter: Secondary | ICD-10-CM | POA: Diagnosis not present

## 2015-03-16 DIAGNOSIS — M47812 Spondylosis without myelopathy or radiculopathy, cervical region: Secondary | ICD-10-CM | POA: Diagnosis not present

## 2015-03-16 DIAGNOSIS — M9902 Segmental and somatic dysfunction of thoracic region: Secondary | ICD-10-CM | POA: Diagnosis not present

## 2015-03-18 DIAGNOSIS — M545 Low back pain: Secondary | ICD-10-CM | POA: Diagnosis not present

## 2015-03-21 DIAGNOSIS — S29012A Strain of muscle and tendon of back wall of thorax, initial encounter: Secondary | ICD-10-CM | POA: Diagnosis not present

## 2015-03-21 DIAGNOSIS — S39012A Strain of muscle, fascia and tendon of lower back, initial encounter: Secondary | ICD-10-CM | POA: Diagnosis not present

## 2015-03-21 DIAGNOSIS — M47812 Spondylosis without myelopathy or radiculopathy, cervical region: Secondary | ICD-10-CM | POA: Diagnosis not present

## 2015-03-21 DIAGNOSIS — M9902 Segmental and somatic dysfunction of thoracic region: Secondary | ICD-10-CM | POA: Diagnosis not present

## 2015-03-21 DIAGNOSIS — M9903 Segmental and somatic dysfunction of lumbar region: Secondary | ICD-10-CM | POA: Diagnosis not present

## 2015-03-21 DIAGNOSIS — M9901 Segmental and somatic dysfunction of cervical region: Secondary | ICD-10-CM | POA: Diagnosis not present

## 2015-03-24 DIAGNOSIS — S39012A Strain of muscle, fascia and tendon of lower back, initial encounter: Secondary | ICD-10-CM | POA: Diagnosis not present

## 2015-03-24 DIAGNOSIS — M47812 Spondylosis without myelopathy or radiculopathy, cervical region: Secondary | ICD-10-CM | POA: Diagnosis not present

## 2015-03-24 DIAGNOSIS — M9902 Segmental and somatic dysfunction of thoracic region: Secondary | ICD-10-CM | POA: Diagnosis not present

## 2015-03-24 DIAGNOSIS — M9901 Segmental and somatic dysfunction of cervical region: Secondary | ICD-10-CM | POA: Diagnosis not present

## 2015-03-24 DIAGNOSIS — M9903 Segmental and somatic dysfunction of lumbar region: Secondary | ICD-10-CM | POA: Diagnosis not present

## 2015-03-24 DIAGNOSIS — S29012A Strain of muscle and tendon of back wall of thorax, initial encounter: Secondary | ICD-10-CM | POA: Diagnosis not present

## 2015-04-01 DIAGNOSIS — M545 Low back pain: Secondary | ICD-10-CM | POA: Diagnosis not present

## 2015-04-08 DIAGNOSIS — M545 Low back pain: Secondary | ICD-10-CM | POA: Diagnosis not present

## 2015-05-03 DIAGNOSIS — M9903 Segmental and somatic dysfunction of lumbar region: Secondary | ICD-10-CM | POA: Diagnosis not present

## 2015-05-03 DIAGNOSIS — M9901 Segmental and somatic dysfunction of cervical region: Secondary | ICD-10-CM | POA: Diagnosis not present

## 2015-05-03 DIAGNOSIS — S39012A Strain of muscle, fascia and tendon of lower back, initial encounter: Secondary | ICD-10-CM | POA: Diagnosis not present

## 2015-05-03 DIAGNOSIS — M9902 Segmental and somatic dysfunction of thoracic region: Secondary | ICD-10-CM | POA: Diagnosis not present

## 2015-05-03 DIAGNOSIS — M47812 Spondylosis without myelopathy or radiculopathy, cervical region: Secondary | ICD-10-CM | POA: Diagnosis not present

## 2015-05-03 DIAGNOSIS — S29012A Strain of muscle and tendon of back wall of thorax, initial encounter: Secondary | ICD-10-CM | POA: Diagnosis not present

## 2015-05-05 DIAGNOSIS — M9901 Segmental and somatic dysfunction of cervical region: Secondary | ICD-10-CM | POA: Diagnosis not present

## 2015-05-05 DIAGNOSIS — M9902 Segmental and somatic dysfunction of thoracic region: Secondary | ICD-10-CM | POA: Diagnosis not present

## 2015-05-05 DIAGNOSIS — M47812 Spondylosis without myelopathy or radiculopathy, cervical region: Secondary | ICD-10-CM | POA: Diagnosis not present

## 2015-05-05 DIAGNOSIS — F331 Major depressive disorder, recurrent, moderate: Secondary | ICD-10-CM | POA: Diagnosis not present

## 2015-05-05 DIAGNOSIS — S29012A Strain of muscle and tendon of back wall of thorax, initial encounter: Secondary | ICD-10-CM | POA: Diagnosis not present

## 2015-05-05 DIAGNOSIS — M9903 Segmental and somatic dysfunction of lumbar region: Secondary | ICD-10-CM | POA: Diagnosis not present

## 2015-05-05 DIAGNOSIS — S39012A Strain of muscle, fascia and tendon of lower back, initial encounter: Secondary | ICD-10-CM | POA: Diagnosis not present

## 2015-05-10 DIAGNOSIS — M9901 Segmental and somatic dysfunction of cervical region: Secondary | ICD-10-CM | POA: Diagnosis not present

## 2015-05-10 DIAGNOSIS — S39012A Strain of muscle, fascia and tendon of lower back, initial encounter: Secondary | ICD-10-CM | POA: Diagnosis not present

## 2015-05-10 DIAGNOSIS — M9902 Segmental and somatic dysfunction of thoracic region: Secondary | ICD-10-CM | POA: Diagnosis not present

## 2015-05-10 DIAGNOSIS — M9903 Segmental and somatic dysfunction of lumbar region: Secondary | ICD-10-CM | POA: Diagnosis not present

## 2015-05-10 DIAGNOSIS — M47812 Spondylosis without myelopathy or radiculopathy, cervical region: Secondary | ICD-10-CM | POA: Diagnosis not present

## 2015-05-10 DIAGNOSIS — S29012A Strain of muscle and tendon of back wall of thorax, initial encounter: Secondary | ICD-10-CM | POA: Diagnosis not present

## 2015-05-12 DIAGNOSIS — M47812 Spondylosis without myelopathy or radiculopathy, cervical region: Secondary | ICD-10-CM | POA: Diagnosis not present

## 2015-05-12 DIAGNOSIS — S29012A Strain of muscle and tendon of back wall of thorax, initial encounter: Secondary | ICD-10-CM | POA: Diagnosis not present

## 2015-05-12 DIAGNOSIS — S39012A Strain of muscle, fascia and tendon of lower back, initial encounter: Secondary | ICD-10-CM | POA: Diagnosis not present

## 2015-05-12 DIAGNOSIS — M9902 Segmental and somatic dysfunction of thoracic region: Secondary | ICD-10-CM | POA: Diagnosis not present

## 2015-05-12 DIAGNOSIS — M9901 Segmental and somatic dysfunction of cervical region: Secondary | ICD-10-CM | POA: Diagnosis not present

## 2015-05-12 DIAGNOSIS — M9903 Segmental and somatic dysfunction of lumbar region: Secondary | ICD-10-CM | POA: Diagnosis not present

## 2015-05-18 DIAGNOSIS — S39012A Strain of muscle, fascia and tendon of lower back, initial encounter: Secondary | ICD-10-CM | POA: Diagnosis not present

## 2015-05-18 DIAGNOSIS — M9903 Segmental and somatic dysfunction of lumbar region: Secondary | ICD-10-CM | POA: Diagnosis not present

## 2015-05-18 DIAGNOSIS — S29012A Strain of muscle and tendon of back wall of thorax, initial encounter: Secondary | ICD-10-CM | POA: Diagnosis not present

## 2015-05-18 DIAGNOSIS — M9902 Segmental and somatic dysfunction of thoracic region: Secondary | ICD-10-CM | POA: Diagnosis not present

## 2015-05-18 DIAGNOSIS — M9901 Segmental and somatic dysfunction of cervical region: Secondary | ICD-10-CM | POA: Diagnosis not present

## 2015-05-18 DIAGNOSIS — M47812 Spondylosis without myelopathy or radiculopathy, cervical region: Secondary | ICD-10-CM | POA: Diagnosis not present

## 2015-05-31 DIAGNOSIS — I10 Essential (primary) hypertension: Secondary | ICD-10-CM | POA: Diagnosis not present

## 2015-05-31 DIAGNOSIS — R5383 Other fatigue: Secondary | ICD-10-CM | POA: Diagnosis not present

## 2015-05-31 DIAGNOSIS — E042 Nontoxic multinodular goiter: Secondary | ICD-10-CM | POA: Diagnosis not present

## 2015-05-31 DIAGNOSIS — K219 Gastro-esophageal reflux disease without esophagitis: Secondary | ICD-10-CM | POA: Diagnosis not present

## 2015-05-31 DIAGNOSIS — B372 Candidiasis of skin and nail: Secondary | ICD-10-CM | POA: Diagnosis not present

## 2015-05-31 DIAGNOSIS — F329 Major depressive disorder, single episode, unspecified: Secondary | ICD-10-CM | POA: Diagnosis not present

## 2015-05-31 DIAGNOSIS — F419 Anxiety disorder, unspecified: Secondary | ICD-10-CM | POA: Diagnosis not present

## 2015-05-31 DIAGNOSIS — E039 Hypothyroidism, unspecified: Secondary | ICD-10-CM | POA: Diagnosis not present

## 2015-06-02 DIAGNOSIS — S39012A Strain of muscle, fascia and tendon of lower back, initial encounter: Secondary | ICD-10-CM | POA: Diagnosis not present

## 2015-06-02 DIAGNOSIS — M9902 Segmental and somatic dysfunction of thoracic region: Secondary | ICD-10-CM | POA: Diagnosis not present

## 2015-06-02 DIAGNOSIS — S29012A Strain of muscle and tendon of back wall of thorax, initial encounter: Secondary | ICD-10-CM | POA: Diagnosis not present

## 2015-06-02 DIAGNOSIS — M9901 Segmental and somatic dysfunction of cervical region: Secondary | ICD-10-CM | POA: Diagnosis not present

## 2015-06-02 DIAGNOSIS — M47812 Spondylosis without myelopathy or radiculopathy, cervical region: Secondary | ICD-10-CM | POA: Diagnosis not present

## 2015-06-02 DIAGNOSIS — M9903 Segmental and somatic dysfunction of lumbar region: Secondary | ICD-10-CM | POA: Diagnosis not present

## 2015-06-07 DIAGNOSIS — R110 Nausea: Secondary | ICD-10-CM | POA: Diagnosis not present

## 2015-06-07 DIAGNOSIS — S39012A Strain of muscle, fascia and tendon of lower back, initial encounter: Secondary | ICD-10-CM | POA: Diagnosis not present

## 2015-06-07 DIAGNOSIS — M47812 Spondylosis without myelopathy or radiculopathy, cervical region: Secondary | ICD-10-CM | POA: Diagnosis not present

## 2015-06-07 DIAGNOSIS — S29012A Strain of muscle and tendon of back wall of thorax, initial encounter: Secondary | ICD-10-CM | POA: Diagnosis not present

## 2015-06-07 DIAGNOSIS — K219 Gastro-esophageal reflux disease without esophagitis: Secondary | ICD-10-CM | POA: Diagnosis not present

## 2015-06-07 DIAGNOSIS — M9903 Segmental and somatic dysfunction of lumbar region: Secondary | ICD-10-CM | POA: Diagnosis not present

## 2015-06-07 DIAGNOSIS — R1013 Epigastric pain: Secondary | ICD-10-CM | POA: Diagnosis not present

## 2015-06-07 DIAGNOSIS — M9902 Segmental and somatic dysfunction of thoracic region: Secondary | ICD-10-CM | POA: Diagnosis not present

## 2015-06-07 DIAGNOSIS — K5900 Constipation, unspecified: Secondary | ICD-10-CM | POA: Diagnosis not present

## 2015-06-07 DIAGNOSIS — M9901 Segmental and somatic dysfunction of cervical region: Secondary | ICD-10-CM | POA: Diagnosis not present

## 2015-06-23 DIAGNOSIS — S39012A Strain of muscle, fascia and tendon of lower back, initial encounter: Secondary | ICD-10-CM | POA: Diagnosis not present

## 2015-06-23 DIAGNOSIS — M9903 Segmental and somatic dysfunction of lumbar region: Secondary | ICD-10-CM | POA: Diagnosis not present

## 2015-06-23 DIAGNOSIS — M47812 Spondylosis without myelopathy or radiculopathy, cervical region: Secondary | ICD-10-CM | POA: Diagnosis not present

## 2015-06-23 DIAGNOSIS — M9901 Segmental and somatic dysfunction of cervical region: Secondary | ICD-10-CM | POA: Diagnosis not present

## 2015-06-23 DIAGNOSIS — M9902 Segmental and somatic dysfunction of thoracic region: Secondary | ICD-10-CM | POA: Diagnosis not present

## 2015-06-23 DIAGNOSIS — S29012A Strain of muscle and tendon of back wall of thorax, initial encounter: Secondary | ICD-10-CM | POA: Diagnosis not present

## 2015-06-30 DIAGNOSIS — S29012A Strain of muscle and tendon of back wall of thorax, initial encounter: Secondary | ICD-10-CM | POA: Diagnosis not present

## 2015-06-30 DIAGNOSIS — M47812 Spondylosis without myelopathy or radiculopathy, cervical region: Secondary | ICD-10-CM | POA: Diagnosis not present

## 2015-06-30 DIAGNOSIS — M9902 Segmental and somatic dysfunction of thoracic region: Secondary | ICD-10-CM | POA: Diagnosis not present

## 2015-06-30 DIAGNOSIS — M9903 Segmental and somatic dysfunction of lumbar region: Secondary | ICD-10-CM | POA: Diagnosis not present

## 2015-06-30 DIAGNOSIS — S39012A Strain of muscle, fascia and tendon of lower back, initial encounter: Secondary | ICD-10-CM | POA: Diagnosis not present

## 2015-06-30 DIAGNOSIS — M9901 Segmental and somatic dysfunction of cervical region: Secondary | ICD-10-CM | POA: Diagnosis not present

## 2015-07-06 ENCOUNTER — Other Ambulatory Visit: Payer: Self-pay | Admitting: Gastroenterology

## 2015-07-06 DIAGNOSIS — K2900 Acute gastritis without bleeding: Secondary | ICD-10-CM | POA: Diagnosis not present

## 2015-07-06 DIAGNOSIS — R1110 Vomiting, unspecified: Secondary | ICD-10-CM | POA: Diagnosis not present

## 2015-07-06 DIAGNOSIS — K3189 Other diseases of stomach and duodenum: Secondary | ICD-10-CM | POA: Diagnosis not present

## 2015-07-06 DIAGNOSIS — R12 Heartburn: Secondary | ICD-10-CM | POA: Diagnosis not present

## 2015-07-06 DIAGNOSIS — K219 Gastro-esophageal reflux disease without esophagitis: Secondary | ICD-10-CM | POA: Diagnosis not present

## 2015-07-14 DIAGNOSIS — S29012A Strain of muscle and tendon of back wall of thorax, initial encounter: Secondary | ICD-10-CM | POA: Diagnosis not present

## 2015-07-14 DIAGNOSIS — M9903 Segmental and somatic dysfunction of lumbar region: Secondary | ICD-10-CM | POA: Diagnosis not present

## 2015-07-14 DIAGNOSIS — S39012A Strain of muscle, fascia and tendon of lower back, initial encounter: Secondary | ICD-10-CM | POA: Diagnosis not present

## 2015-07-14 DIAGNOSIS — M9902 Segmental and somatic dysfunction of thoracic region: Secondary | ICD-10-CM | POA: Diagnosis not present

## 2015-07-14 DIAGNOSIS — M47812 Spondylosis without myelopathy or radiculopathy, cervical region: Secondary | ICD-10-CM | POA: Diagnosis not present

## 2015-07-14 DIAGNOSIS — M9901 Segmental and somatic dysfunction of cervical region: Secondary | ICD-10-CM | POA: Diagnosis not present

## 2015-07-20 DIAGNOSIS — M9902 Segmental and somatic dysfunction of thoracic region: Secondary | ICD-10-CM | POA: Diagnosis not present

## 2015-07-20 DIAGNOSIS — S29012A Strain of muscle and tendon of back wall of thorax, initial encounter: Secondary | ICD-10-CM | POA: Diagnosis not present

## 2015-07-20 DIAGNOSIS — M47812 Spondylosis without myelopathy or radiculopathy, cervical region: Secondary | ICD-10-CM | POA: Diagnosis not present

## 2015-07-20 DIAGNOSIS — S39012A Strain of muscle, fascia and tendon of lower back, initial encounter: Secondary | ICD-10-CM | POA: Diagnosis not present

## 2015-07-20 DIAGNOSIS — M9903 Segmental and somatic dysfunction of lumbar region: Secondary | ICD-10-CM | POA: Diagnosis not present

## 2015-07-20 DIAGNOSIS — M9901 Segmental and somatic dysfunction of cervical region: Secondary | ICD-10-CM | POA: Diagnosis not present

## 2015-07-27 DIAGNOSIS — M9902 Segmental and somatic dysfunction of thoracic region: Secondary | ICD-10-CM | POA: Diagnosis not present

## 2015-07-27 DIAGNOSIS — S39012A Strain of muscle, fascia and tendon of lower back, initial encounter: Secondary | ICD-10-CM | POA: Diagnosis not present

## 2015-07-27 DIAGNOSIS — M9903 Segmental and somatic dysfunction of lumbar region: Secondary | ICD-10-CM | POA: Diagnosis not present

## 2015-07-27 DIAGNOSIS — M47812 Spondylosis without myelopathy or radiculopathy, cervical region: Secondary | ICD-10-CM | POA: Diagnosis not present

## 2015-07-27 DIAGNOSIS — M9901 Segmental and somatic dysfunction of cervical region: Secondary | ICD-10-CM | POA: Diagnosis not present

## 2015-07-27 DIAGNOSIS — S29012A Strain of muscle and tendon of back wall of thorax, initial encounter: Secondary | ICD-10-CM | POA: Diagnosis not present

## 2015-08-05 DIAGNOSIS — S29012A Strain of muscle and tendon of back wall of thorax, initial encounter: Secondary | ICD-10-CM | POA: Diagnosis not present

## 2015-08-05 DIAGNOSIS — M9903 Segmental and somatic dysfunction of lumbar region: Secondary | ICD-10-CM | POA: Diagnosis not present

## 2015-08-05 DIAGNOSIS — S39012A Strain of muscle, fascia and tendon of lower back, initial encounter: Secondary | ICD-10-CM | POA: Diagnosis not present

## 2015-08-05 DIAGNOSIS — M9902 Segmental and somatic dysfunction of thoracic region: Secondary | ICD-10-CM | POA: Diagnosis not present

## 2015-08-05 DIAGNOSIS — M9901 Segmental and somatic dysfunction of cervical region: Secondary | ICD-10-CM | POA: Diagnosis not present

## 2015-08-05 DIAGNOSIS — M47812 Spondylosis without myelopathy or radiculopathy, cervical region: Secondary | ICD-10-CM | POA: Diagnosis not present

## 2015-08-10 DIAGNOSIS — L918 Other hypertrophic disorders of the skin: Secondary | ICD-10-CM | POA: Diagnosis not present

## 2015-08-10 DIAGNOSIS — Z85828 Personal history of other malignant neoplasm of skin: Secondary | ICD-10-CM | POA: Diagnosis not present

## 2015-08-10 DIAGNOSIS — L821 Other seborrheic keratosis: Secondary | ICD-10-CM | POA: Diagnosis not present

## 2015-08-10 DIAGNOSIS — L820 Inflamed seborrheic keratosis: Secondary | ICD-10-CM | POA: Diagnosis not present

## 2015-08-10 DIAGNOSIS — L720 Epidermal cyst: Secondary | ICD-10-CM | POA: Diagnosis not present

## 2015-08-10 DIAGNOSIS — L814 Other melanin hyperpigmentation: Secondary | ICD-10-CM | POA: Diagnosis not present

## 2015-08-10 DIAGNOSIS — D1801 Hemangioma of skin and subcutaneous tissue: Secondary | ICD-10-CM | POA: Diagnosis not present

## 2015-08-10 DIAGNOSIS — L570 Actinic keratosis: Secondary | ICD-10-CM | POA: Diagnosis not present

## 2015-08-10 DIAGNOSIS — L812 Freckles: Secondary | ICD-10-CM | POA: Diagnosis not present

## 2015-08-17 DIAGNOSIS — E039 Hypothyroidism, unspecified: Secondary | ICD-10-CM | POA: Diagnosis not present

## 2015-08-17 DIAGNOSIS — I10 Essential (primary) hypertension: Secondary | ICD-10-CM | POA: Diagnosis not present

## 2015-08-18 DIAGNOSIS — F331 Major depressive disorder, recurrent, moderate: Secondary | ICD-10-CM | POA: Diagnosis not present

## 2015-08-19 DIAGNOSIS — S29012A Strain of muscle and tendon of back wall of thorax, initial encounter: Secondary | ICD-10-CM | POA: Diagnosis not present

## 2015-08-19 DIAGNOSIS — M9903 Segmental and somatic dysfunction of lumbar region: Secondary | ICD-10-CM | POA: Diagnosis not present

## 2015-08-19 DIAGNOSIS — M9901 Segmental and somatic dysfunction of cervical region: Secondary | ICD-10-CM | POA: Diagnosis not present

## 2015-08-19 DIAGNOSIS — M9902 Segmental and somatic dysfunction of thoracic region: Secondary | ICD-10-CM | POA: Diagnosis not present

## 2015-08-19 DIAGNOSIS — S39012A Strain of muscle, fascia and tendon of lower back, initial encounter: Secondary | ICD-10-CM | POA: Diagnosis not present

## 2015-08-19 DIAGNOSIS — M47812 Spondylosis without myelopathy or radiculopathy, cervical region: Secondary | ICD-10-CM | POA: Diagnosis not present

## 2015-08-22 DIAGNOSIS — R0683 Snoring: Secondary | ICD-10-CM | POA: Diagnosis not present

## 2015-08-22 DIAGNOSIS — R400 Somnolence: Secondary | ICD-10-CM | POA: Diagnosis not present

## 2015-09-01 DIAGNOSIS — M9903 Segmental and somatic dysfunction of lumbar region: Secondary | ICD-10-CM | POA: Diagnosis not present

## 2015-09-01 DIAGNOSIS — M9902 Segmental and somatic dysfunction of thoracic region: Secondary | ICD-10-CM | POA: Diagnosis not present

## 2015-09-01 DIAGNOSIS — M9901 Segmental and somatic dysfunction of cervical region: Secondary | ICD-10-CM | POA: Diagnosis not present

## 2015-09-01 DIAGNOSIS — M47812 Spondylosis without myelopathy or radiculopathy, cervical region: Secondary | ICD-10-CM | POA: Diagnosis not present

## 2015-09-01 DIAGNOSIS — S29012A Strain of muscle and tendon of back wall of thorax, initial encounter: Secondary | ICD-10-CM | POA: Diagnosis not present

## 2015-09-01 DIAGNOSIS — S39012A Strain of muscle, fascia and tendon of lower back, initial encounter: Secondary | ICD-10-CM | POA: Diagnosis not present

## 2015-09-06 DIAGNOSIS — H04123 Dry eye syndrome of bilateral lacrimal glands: Secondary | ICD-10-CM | POA: Diagnosis not present

## 2015-09-14 DIAGNOSIS — S29012A Strain of muscle and tendon of back wall of thorax, initial encounter: Secondary | ICD-10-CM | POA: Diagnosis not present

## 2015-09-14 DIAGNOSIS — M9902 Segmental and somatic dysfunction of thoracic region: Secondary | ICD-10-CM | POA: Diagnosis not present

## 2015-09-14 DIAGNOSIS — M47812 Spondylosis without myelopathy or radiculopathy, cervical region: Secondary | ICD-10-CM | POA: Diagnosis not present

## 2015-09-14 DIAGNOSIS — M9903 Segmental and somatic dysfunction of lumbar region: Secondary | ICD-10-CM | POA: Diagnosis not present

## 2015-09-14 DIAGNOSIS — S39012A Strain of muscle, fascia and tendon of lower back, initial encounter: Secondary | ICD-10-CM | POA: Diagnosis not present

## 2015-09-14 DIAGNOSIS — M9901 Segmental and somatic dysfunction of cervical region: Secondary | ICD-10-CM | POA: Diagnosis not present

## 2015-09-22 DIAGNOSIS — M9901 Segmental and somatic dysfunction of cervical region: Secondary | ICD-10-CM | POA: Diagnosis not present

## 2015-09-22 DIAGNOSIS — S39012A Strain of muscle, fascia and tendon of lower back, initial encounter: Secondary | ICD-10-CM | POA: Diagnosis not present

## 2015-09-22 DIAGNOSIS — S29012A Strain of muscle and tendon of back wall of thorax, initial encounter: Secondary | ICD-10-CM | POA: Diagnosis not present

## 2015-09-22 DIAGNOSIS — M9902 Segmental and somatic dysfunction of thoracic region: Secondary | ICD-10-CM | POA: Diagnosis not present

## 2015-09-22 DIAGNOSIS — M47812 Spondylosis without myelopathy or radiculopathy, cervical region: Secondary | ICD-10-CM | POA: Diagnosis not present

## 2015-09-22 DIAGNOSIS — M9903 Segmental and somatic dysfunction of lumbar region: Secondary | ICD-10-CM | POA: Diagnosis not present

## 2015-10-02 ENCOUNTER — Ambulatory Visit (HOSPITAL_BASED_OUTPATIENT_CLINIC_OR_DEPARTMENT_OTHER): Payer: Medicare Other

## 2015-10-06 ENCOUNTER — Other Ambulatory Visit: Payer: Self-pay

## 2015-10-06 DIAGNOSIS — Z1231 Encounter for screening mammogram for malignant neoplasm of breast: Secondary | ICD-10-CM

## 2015-10-19 DIAGNOSIS — S29012A Strain of muscle and tendon of back wall of thorax, initial encounter: Secondary | ICD-10-CM | POA: Diagnosis not present

## 2015-10-19 DIAGNOSIS — M9902 Segmental and somatic dysfunction of thoracic region: Secondary | ICD-10-CM | POA: Diagnosis not present

## 2015-10-19 DIAGNOSIS — M9901 Segmental and somatic dysfunction of cervical region: Secondary | ICD-10-CM | POA: Diagnosis not present

## 2015-10-19 DIAGNOSIS — M47812 Spondylosis without myelopathy or radiculopathy, cervical region: Secondary | ICD-10-CM | POA: Diagnosis not present

## 2015-10-19 DIAGNOSIS — M9903 Segmental and somatic dysfunction of lumbar region: Secondary | ICD-10-CM | POA: Diagnosis not present

## 2015-10-19 DIAGNOSIS — S39012A Strain of muscle, fascia and tendon of lower back, initial encounter: Secondary | ICD-10-CM | POA: Diagnosis not present

## 2015-10-25 ENCOUNTER — Encounter (HOSPITAL_BASED_OUTPATIENT_CLINIC_OR_DEPARTMENT_OTHER): Payer: Medicare Other

## 2015-10-25 DIAGNOSIS — H04123 Dry eye syndrome of bilateral lacrimal glands: Secondary | ICD-10-CM | POA: Diagnosis not present

## 2015-10-26 DIAGNOSIS — H04123 Dry eye syndrome of bilateral lacrimal glands: Secondary | ICD-10-CM | POA: Diagnosis not present

## 2015-10-27 DIAGNOSIS — M9901 Segmental and somatic dysfunction of cervical region: Secondary | ICD-10-CM | POA: Diagnosis not present

## 2015-10-27 DIAGNOSIS — M9902 Segmental and somatic dysfunction of thoracic region: Secondary | ICD-10-CM | POA: Diagnosis not present

## 2015-10-27 DIAGNOSIS — M9903 Segmental and somatic dysfunction of lumbar region: Secondary | ICD-10-CM | POA: Diagnosis not present

## 2015-10-27 DIAGNOSIS — M47812 Spondylosis without myelopathy or radiculopathy, cervical region: Secondary | ICD-10-CM | POA: Diagnosis not present

## 2015-10-27 DIAGNOSIS — S39012A Strain of muscle, fascia and tendon of lower back, initial encounter: Secondary | ICD-10-CM | POA: Diagnosis not present

## 2015-10-27 DIAGNOSIS — S29012A Strain of muscle and tendon of back wall of thorax, initial encounter: Secondary | ICD-10-CM | POA: Diagnosis not present

## 2015-11-02 ENCOUNTER — Ambulatory Visit: Payer: Medicare Other

## 2015-11-03 DIAGNOSIS — M9901 Segmental and somatic dysfunction of cervical region: Secondary | ICD-10-CM | POA: Diagnosis not present

## 2015-11-03 DIAGNOSIS — S29012A Strain of muscle and tendon of back wall of thorax, initial encounter: Secondary | ICD-10-CM | POA: Diagnosis not present

## 2015-11-03 DIAGNOSIS — M9903 Segmental and somatic dysfunction of lumbar region: Secondary | ICD-10-CM | POA: Diagnosis not present

## 2015-11-03 DIAGNOSIS — M9902 Segmental and somatic dysfunction of thoracic region: Secondary | ICD-10-CM | POA: Diagnosis not present

## 2015-11-03 DIAGNOSIS — S39012A Strain of muscle, fascia and tendon of lower back, initial encounter: Secondary | ICD-10-CM | POA: Diagnosis not present

## 2015-11-03 DIAGNOSIS — M47812 Spondylosis without myelopathy or radiculopathy, cervical region: Secondary | ICD-10-CM | POA: Diagnosis not present

## 2015-11-15 ENCOUNTER — Other Ambulatory Visit: Payer: Self-pay | Admitting: Family Medicine

## 2015-11-15 ENCOUNTER — Other Ambulatory Visit (HOSPITAL_COMMUNITY)
Admission: RE | Admit: 2015-11-15 | Discharge: 2015-11-15 | Disposition: A | Discharge status: Home / Self Care | Payer: Medicare Other | Source: Ambulatory Visit | Attending: Family Medicine | Admitting: Family Medicine

## 2015-11-15 DIAGNOSIS — E7800 Pure hypercholesterolemia, unspecified: Secondary | ICD-10-CM | POA: Diagnosis not present

## 2015-11-15 DIAGNOSIS — Z86010 Personal history of colonic polyps: Secondary | ICD-10-CM | POA: Diagnosis not present

## 2015-11-15 DIAGNOSIS — Z1389 Encounter for screening for other disorder: Secondary | ICD-10-CM | POA: Diagnosis not present

## 2015-11-15 DIAGNOSIS — Z0000 Encounter for general adult medical examination without abnormal findings: Secondary | ICD-10-CM | POA: Diagnosis not present

## 2015-11-15 DIAGNOSIS — F329 Major depressive disorder, single episode, unspecified: Secondary | ICD-10-CM | POA: Diagnosis not present

## 2015-11-15 DIAGNOSIS — F419 Anxiety disorder, unspecified: Secondary | ICD-10-CM | POA: Diagnosis not present

## 2015-11-15 DIAGNOSIS — Z124 Encounter for screening for malignant neoplasm of cervix: Secondary | ICD-10-CM | POA: Diagnosis not present

## 2015-11-15 DIAGNOSIS — E039 Hypothyroidism, unspecified: Secondary | ICD-10-CM | POA: Diagnosis not present

## 2015-11-15 DIAGNOSIS — Z13820 Encounter for screening for osteoporosis: Secondary | ICD-10-CM | POA: Diagnosis not present

## 2015-11-15 DIAGNOSIS — K219 Gastro-esophageal reflux disease without esophagitis: Secondary | ICD-10-CM | POA: Diagnosis not present

## 2015-11-15 DIAGNOSIS — I10 Essential (primary) hypertension: Secondary | ICD-10-CM | POA: Diagnosis not present

## 2015-11-17 DIAGNOSIS — M9902 Segmental and somatic dysfunction of thoracic region: Secondary | ICD-10-CM | POA: Diagnosis not present

## 2015-11-17 DIAGNOSIS — M9901 Segmental and somatic dysfunction of cervical region: Secondary | ICD-10-CM | POA: Diagnosis not present

## 2015-11-17 DIAGNOSIS — M9903 Segmental and somatic dysfunction of lumbar region: Secondary | ICD-10-CM | POA: Diagnosis not present

## 2015-11-17 DIAGNOSIS — M47812 Spondylosis without myelopathy or radiculopathy, cervical region: Secondary | ICD-10-CM | POA: Diagnosis not present

## 2015-11-17 DIAGNOSIS — S29012A Strain of muscle and tendon of back wall of thorax, initial encounter: Secondary | ICD-10-CM | POA: Diagnosis not present

## 2015-11-17 DIAGNOSIS — S39012A Strain of muscle, fascia and tendon of lower back, initial encounter: Secondary | ICD-10-CM | POA: Diagnosis not present

## 2015-11-17 LAB — CYTOLOGY - PAP

## 2015-11-30 DIAGNOSIS — S39012A Strain of muscle, fascia and tendon of lower back, initial encounter: Secondary | ICD-10-CM | POA: Diagnosis not present

## 2015-11-30 DIAGNOSIS — M47812 Spondylosis without myelopathy or radiculopathy, cervical region: Secondary | ICD-10-CM | POA: Diagnosis not present

## 2015-11-30 DIAGNOSIS — S29012A Strain of muscle and tendon of back wall of thorax, initial encounter: Secondary | ICD-10-CM | POA: Diagnosis not present

## 2015-11-30 DIAGNOSIS — M9901 Segmental and somatic dysfunction of cervical region: Secondary | ICD-10-CM | POA: Diagnosis not present

## 2015-11-30 DIAGNOSIS — M9902 Segmental and somatic dysfunction of thoracic region: Secondary | ICD-10-CM | POA: Diagnosis not present

## 2015-11-30 DIAGNOSIS — M9903 Segmental and somatic dysfunction of lumbar region: Secondary | ICD-10-CM | POA: Diagnosis not present

## 2015-12-02 DIAGNOSIS — F331 Major depressive disorder, recurrent, moderate: Secondary | ICD-10-CM | POA: Diagnosis not present

## 2015-12-05 ENCOUNTER — Ambulatory Visit
Admission: RE | Admit: 2015-12-05 | Discharge: 2015-12-05 | Disposition: A | Discharge status: Home / Self Care | Payer: Medicare Other | Source: Ambulatory Visit

## 2015-12-05 DIAGNOSIS — Z1231 Encounter for screening mammogram for malignant neoplasm of breast: Secondary | ICD-10-CM

## 2015-12-12 ENCOUNTER — Ambulatory Visit (HOSPITAL_BASED_OUTPATIENT_CLINIC_OR_DEPARTMENT_OTHER): Payer: Medicare Other | Attending: Internal Medicine | Admitting: Radiology

## 2015-12-12 VITALS — Ht 67.0 in | Wt 228.0 lb

## 2015-12-12 DIAGNOSIS — R0683 Snoring: Secondary | ICD-10-CM | POA: Diagnosis not present

## 2015-12-12 DIAGNOSIS — R5383 Other fatigue: Secondary | ICD-10-CM

## 2015-12-12 DIAGNOSIS — G4733 Obstructive sleep apnea (adult) (pediatric): Secondary | ICD-10-CM | POA: Insufficient documentation

## 2015-12-12 DIAGNOSIS — G4710 Hypersomnia, unspecified: Secondary | ICD-10-CM | POA: Diagnosis not present

## 2015-12-12 DIAGNOSIS — Z79899 Other long term (current) drug therapy: Secondary | ICD-10-CM | POA: Diagnosis not present

## 2015-12-12 DIAGNOSIS — G4736 Sleep related hypoventilation in conditions classified elsewhere: Secondary | ICD-10-CM | POA: Insufficient documentation

## 2015-12-18 DIAGNOSIS — G4736 Sleep related hypoventilation in conditions classified elsewhere: Secondary | ICD-10-CM | POA: Diagnosis not present

## 2015-12-18 DIAGNOSIS — G4733 Obstructive sleep apnea (adult) (pediatric): Secondary | ICD-10-CM | POA: Diagnosis not present

## 2015-12-18 NOTE — Progress Notes (Signed)
   Patient Name: Olivia, [name] Date: 12/12/2015 Gender: Female D.O.B: [birth year] Age (years): 65 Referring Provider: Carlena Sax Height (inches): 80 Interpreting Physician: Baird Lyons MD, ABSM Weight (lbs): 228 RPSGT: Laren Everts BMI: 36 MRN: [id number] Neck Size: 14.00 CLINICAL INFORMATION Sleep Study Type: NPSG   Indication for sleep study: Daytime Fatigue, Snoring   Epworth Sleepiness Score:  2/24   SLEEP STUDY TECHNIQUE As per the AASM Manual for the Scoring of Sleep and Associated Events v2.3 (April 2016) with a hypopnea requiring 4% desaturations. The channels recorded and monitored were frontal, central and occipital EEG, electrooculogram (EOG), submentalis EMG (chin), nasal and oral airflow, thoracic and abdominal wall motion, anterior tibialis EMG, snore microphone, electrocardiogram, and pulse oximetry.  MEDICATIONS Patient's medications include: charted for review Medications self-administered by patient during sleep study : CLONAZEPAM, NEXIUM, MIRTAZAPINE.  SLEEP ARCHITECTURE The study was initiated at 10:15:32 PM and ended at 5:11:19 AM. Sleep onset time was 20.2 minutes and the sleep efficiency was 91.0%. The total sleep time was 378.5 minutes. Stage REM latency was 328.0 minutes. The patient spent 19.82% of the night in stage N1 sleep, 75.96% in stage N2 sleep, 0.00% in stage N3 and 4.23% in REM. Alpha intrusion was absent. Supine sleep was 28.82%. Wake after sleep onset 17 minutes  RESPIRATORY PARAMETERS The overall apnea/hypopnea index (AHI) was 42.0 per hour. There were 8 total apneas, including 8 obstructive, 0 central and 0 mixed apneas. There were 257 hypopneas and 19 RERAs. The AHI during Stage REM sleep was 30.0 per hour. AHI while supine was 63.2 per hour. The mean oxygen saturation was 91.02%. The minimum SpO2 during sleep was 80.00%. Moderate snoring was noted during this study.  CARDIAC DATA The 2 lead EKG demonstrated sinus  rhythm. The mean heart rate was 72.92 beats per minute. Other EKG findings include: None.  LEG MOVEMENT DATA The total PLMS were 1 with a resulting PLMS index of 0.16. Associated arousal with leg movement index was 0.0 .  IMPRESSIONS - Severe obstructive sleep apnea occurred during this study (AHI = 42.0/h). - No significant central sleep apnea occurred during this study (CAI = 0.0/h). - Moderate oxygen desaturation was noted during this study (Min O2 = 80.00%). - The patient snored with Moderate snoring volume. - No cardiac abnormalities were noted during this study. - Clinically significant periodic limb movements did not occur during sleep. No significant associated arousals.  DIAGNOSIS - Obstructive Sleep Apnea (327.23 [G47.33 ICD-10]) - Nocturnal Hypoxemia (327.26 [G47.36 ICD-10])  RECOMMENDATIONS - Therapeutic CPAP titration to determine optimal pressure required to alleviate sleep disordered breathing. - Positional therapy avoiding supine position during sleep. - Avoid alcohol, sedatives and other CNS depressants that may worsen sleep apnea and disrupt normal sleep architecture. - Sleep hygiene should be reviewed to assess factors that may improve sleep quality. - Weight management and regular exercise should be initiated or continued if appropriate.  Deneise Lever Diplomate, American Board of Sleep Medicine  ELECTRONICALLY SIGNED ON:  12/18/2015, 2:28 PM [place] PH: [phone]   FX: [phone] [place]

## 2016-01-10 DIAGNOSIS — S29012A Strain of muscle and tendon of back wall of thorax, initial encounter: Secondary | ICD-10-CM | POA: Diagnosis not present

## 2016-01-10 DIAGNOSIS — M9903 Segmental and somatic dysfunction of lumbar region: Secondary | ICD-10-CM | POA: Diagnosis not present

## 2016-01-10 DIAGNOSIS — M9902 Segmental and somatic dysfunction of thoracic region: Secondary | ICD-10-CM | POA: Diagnosis not present

## 2016-01-10 DIAGNOSIS — S39012A Strain of muscle, fascia and tendon of lower back, initial encounter: Secondary | ICD-10-CM | POA: Diagnosis not present

## 2016-01-10 DIAGNOSIS — M9901 Segmental and somatic dysfunction of cervical region: Secondary | ICD-10-CM | POA: Diagnosis not present

## 2016-01-10 DIAGNOSIS — M47812 Spondylosis without myelopathy or radiculopathy, cervical region: Secondary | ICD-10-CM | POA: Diagnosis not present

## 2016-01-17 DIAGNOSIS — M9901 Segmental and somatic dysfunction of cervical region: Secondary | ICD-10-CM | POA: Diagnosis not present

## 2016-01-17 DIAGNOSIS — M9902 Segmental and somatic dysfunction of thoracic region: Secondary | ICD-10-CM | POA: Diagnosis not present

## 2016-01-17 DIAGNOSIS — S39012A Strain of muscle, fascia and tendon of lower back, initial encounter: Secondary | ICD-10-CM | POA: Diagnosis not present

## 2016-01-17 DIAGNOSIS — M47812 Spondylosis without myelopathy or radiculopathy, cervical region: Secondary | ICD-10-CM | POA: Diagnosis not present

## 2016-01-17 DIAGNOSIS — S29012A Strain of muscle and tendon of back wall of thorax, initial encounter: Secondary | ICD-10-CM | POA: Diagnosis not present

## 2016-01-17 DIAGNOSIS — M9903 Segmental and somatic dysfunction of lumbar region: Secondary | ICD-10-CM | POA: Diagnosis not present

## 2016-01-26 DIAGNOSIS — F331 Major depressive disorder, recurrent, moderate: Secondary | ICD-10-CM | POA: Diagnosis not present

## 2016-01-31 DIAGNOSIS — M9903 Segmental and somatic dysfunction of lumbar region: Secondary | ICD-10-CM | POA: Diagnosis not present

## 2016-01-31 DIAGNOSIS — S39012A Strain of muscle, fascia and tendon of lower back, initial encounter: Secondary | ICD-10-CM | POA: Diagnosis not present

## 2016-01-31 DIAGNOSIS — M9901 Segmental and somatic dysfunction of cervical region: Secondary | ICD-10-CM | POA: Diagnosis not present

## 2016-01-31 DIAGNOSIS — S29012A Strain of muscle and tendon of back wall of thorax, initial encounter: Secondary | ICD-10-CM | POA: Diagnosis not present

## 2016-01-31 DIAGNOSIS — M47812 Spondylosis without myelopathy or radiculopathy, cervical region: Secondary | ICD-10-CM | POA: Diagnosis not present

## 2016-01-31 DIAGNOSIS — M9902 Segmental and somatic dysfunction of thoracic region: Secondary | ICD-10-CM | POA: Diagnosis not present

## 2016-02-10 DIAGNOSIS — H04123 Dry eye syndrome of bilateral lacrimal glands: Secondary | ICD-10-CM | POA: Diagnosis not present

## 2016-02-15 DIAGNOSIS — S29012A Strain of muscle and tendon of back wall of thorax, initial encounter: Secondary | ICD-10-CM | POA: Diagnosis not present

## 2016-02-15 DIAGNOSIS — M9902 Segmental and somatic dysfunction of thoracic region: Secondary | ICD-10-CM | POA: Diagnosis not present

## 2016-02-15 DIAGNOSIS — M47812 Spondylosis without myelopathy or radiculopathy, cervical region: Secondary | ICD-10-CM | POA: Diagnosis not present

## 2016-02-15 DIAGNOSIS — M9903 Segmental and somatic dysfunction of lumbar region: Secondary | ICD-10-CM | POA: Diagnosis not present

## 2016-02-15 DIAGNOSIS — M9901 Segmental and somatic dysfunction of cervical region: Secondary | ICD-10-CM | POA: Diagnosis not present

## 2016-02-15 DIAGNOSIS — S39012A Strain of muscle, fascia and tendon of lower back, initial encounter: Secondary | ICD-10-CM | POA: Diagnosis not present

## 2016-02-23 DIAGNOSIS — S29012A Strain of muscle and tendon of back wall of thorax, initial encounter: Secondary | ICD-10-CM | POA: Diagnosis not present

## 2016-02-23 DIAGNOSIS — M47812 Spondylosis without myelopathy or radiculopathy, cervical region: Secondary | ICD-10-CM | POA: Diagnosis not present

## 2016-02-23 DIAGNOSIS — M9903 Segmental and somatic dysfunction of lumbar region: Secondary | ICD-10-CM | POA: Diagnosis not present

## 2016-02-23 DIAGNOSIS — S39012A Strain of muscle, fascia and tendon of lower back, initial encounter: Secondary | ICD-10-CM | POA: Diagnosis not present

## 2016-02-23 DIAGNOSIS — M9901 Segmental and somatic dysfunction of cervical region: Secondary | ICD-10-CM | POA: Diagnosis not present

## 2016-02-23 DIAGNOSIS — M9902 Segmental and somatic dysfunction of thoracic region: Secondary | ICD-10-CM | POA: Diagnosis not present

## 2016-03-07 DIAGNOSIS — M9903 Segmental and somatic dysfunction of lumbar region: Secondary | ICD-10-CM | POA: Diagnosis not present

## 2016-03-07 DIAGNOSIS — M47812 Spondylosis without myelopathy or radiculopathy, cervical region: Secondary | ICD-10-CM | POA: Diagnosis not present

## 2016-03-07 DIAGNOSIS — M9901 Segmental and somatic dysfunction of cervical region: Secondary | ICD-10-CM | POA: Diagnosis not present

## 2016-03-07 DIAGNOSIS — M9902 Segmental and somatic dysfunction of thoracic region: Secondary | ICD-10-CM | POA: Diagnosis not present

## 2016-03-07 DIAGNOSIS — S39012A Strain of muscle, fascia and tendon of lower back, initial encounter: Secondary | ICD-10-CM | POA: Diagnosis not present

## 2016-03-07 DIAGNOSIS — S29012A Strain of muscle and tendon of back wall of thorax, initial encounter: Secondary | ICD-10-CM | POA: Diagnosis not present

## 2016-03-08 DIAGNOSIS — G4733 Obstructive sleep apnea (adult) (pediatric): Secondary | ICD-10-CM | POA: Diagnosis not present

## 2016-03-15 DIAGNOSIS — S39012A Strain of muscle, fascia and tendon of lower back, initial encounter: Secondary | ICD-10-CM | POA: Diagnosis not present

## 2016-03-15 DIAGNOSIS — M9902 Segmental and somatic dysfunction of thoracic region: Secondary | ICD-10-CM | POA: Diagnosis not present

## 2016-03-15 DIAGNOSIS — M47812 Spondylosis without myelopathy or radiculopathy, cervical region: Secondary | ICD-10-CM | POA: Diagnosis not present

## 2016-03-15 DIAGNOSIS — S29012A Strain of muscle and tendon of back wall of thorax, initial encounter: Secondary | ICD-10-CM | POA: Diagnosis not present

## 2016-03-15 DIAGNOSIS — M9903 Segmental and somatic dysfunction of lumbar region: Secondary | ICD-10-CM | POA: Diagnosis not present

## 2016-03-15 DIAGNOSIS — M9901 Segmental and somatic dysfunction of cervical region: Secondary | ICD-10-CM | POA: Diagnosis not present

## 2016-03-27 DIAGNOSIS — M545 Low back pain: Secondary | ICD-10-CM | POA: Diagnosis not present

## 2016-03-27 DIAGNOSIS — M461 Sacroiliitis, not elsewhere classified: Secondary | ICD-10-CM | POA: Diagnosis not present

## 2016-04-06 DIAGNOSIS — M461 Sacroiliitis, not elsewhere classified: Secondary | ICD-10-CM | POA: Diagnosis not present

## 2016-04-11 DIAGNOSIS — S29012A Strain of muscle and tendon of back wall of thorax, initial encounter: Secondary | ICD-10-CM | POA: Diagnosis not present

## 2016-04-11 DIAGNOSIS — M9901 Segmental and somatic dysfunction of cervical region: Secondary | ICD-10-CM | POA: Diagnosis not present

## 2016-04-11 DIAGNOSIS — M9903 Segmental and somatic dysfunction of lumbar region: Secondary | ICD-10-CM | POA: Diagnosis not present

## 2016-04-11 DIAGNOSIS — S39012A Strain of muscle, fascia and tendon of lower back, initial encounter: Secondary | ICD-10-CM | POA: Diagnosis not present

## 2016-04-11 DIAGNOSIS — M47812 Spondylosis without myelopathy or radiculopathy, cervical region: Secondary | ICD-10-CM | POA: Diagnosis not present

## 2016-04-11 DIAGNOSIS — M9902 Segmental and somatic dysfunction of thoracic region: Secondary | ICD-10-CM | POA: Diagnosis not present

## 2016-04-25 DIAGNOSIS — M9903 Segmental and somatic dysfunction of lumbar region: Secondary | ICD-10-CM | POA: Diagnosis not present

## 2016-04-25 DIAGNOSIS — S29012A Strain of muscle and tendon of back wall of thorax, initial encounter: Secondary | ICD-10-CM | POA: Diagnosis not present

## 2016-04-25 DIAGNOSIS — M9902 Segmental and somatic dysfunction of thoracic region: Secondary | ICD-10-CM | POA: Diagnosis not present

## 2016-04-25 DIAGNOSIS — S39012A Strain of muscle, fascia and tendon of lower back, initial encounter: Secondary | ICD-10-CM | POA: Diagnosis not present

## 2016-04-25 DIAGNOSIS — M9901 Segmental and somatic dysfunction of cervical region: Secondary | ICD-10-CM | POA: Diagnosis not present

## 2016-04-25 DIAGNOSIS — M47812 Spondylosis without myelopathy or radiculopathy, cervical region: Secondary | ICD-10-CM | POA: Diagnosis not present

## 2016-05-08 DIAGNOSIS — F331 Major depressive disorder, recurrent, moderate: Secondary | ICD-10-CM | POA: Diagnosis not present

## 2016-05-16 DIAGNOSIS — S29012A Strain of muscle and tendon of back wall of thorax, initial encounter: Secondary | ICD-10-CM | POA: Diagnosis not present

## 2016-05-16 DIAGNOSIS — M9903 Segmental and somatic dysfunction of lumbar region: Secondary | ICD-10-CM | POA: Diagnosis not present

## 2016-05-16 DIAGNOSIS — M9902 Segmental and somatic dysfunction of thoracic region: Secondary | ICD-10-CM | POA: Diagnosis not present

## 2016-05-16 DIAGNOSIS — M47812 Spondylosis without myelopathy or radiculopathy, cervical region: Secondary | ICD-10-CM | POA: Diagnosis not present

## 2016-05-16 DIAGNOSIS — M9901 Segmental and somatic dysfunction of cervical region: Secondary | ICD-10-CM | POA: Diagnosis not present

## 2016-05-16 DIAGNOSIS — S39012A Strain of muscle, fascia and tendon of lower back, initial encounter: Secondary | ICD-10-CM | POA: Diagnosis not present

## 2016-06-08 ENCOUNTER — Other Ambulatory Visit: Payer: Self-pay | Admitting: Internal Medicine

## 2016-06-08 DIAGNOSIS — E042 Nontoxic multinodular goiter: Secondary | ICD-10-CM

## 2016-06-08 DIAGNOSIS — E039 Hypothyroidism, unspecified: Secondary | ICD-10-CM | POA: Diagnosis not present

## 2016-06-13 DIAGNOSIS — S29012A Strain of muscle and tendon of back wall of thorax, initial encounter: Secondary | ICD-10-CM | POA: Diagnosis not present

## 2016-06-13 DIAGNOSIS — M9902 Segmental and somatic dysfunction of thoracic region: Secondary | ICD-10-CM | POA: Diagnosis not present

## 2016-06-13 DIAGNOSIS — M9901 Segmental and somatic dysfunction of cervical region: Secondary | ICD-10-CM | POA: Diagnosis not present

## 2016-06-13 DIAGNOSIS — S39012A Strain of muscle, fascia and tendon of lower back, initial encounter: Secondary | ICD-10-CM | POA: Diagnosis not present

## 2016-06-13 DIAGNOSIS — M9903 Segmental and somatic dysfunction of lumbar region: Secondary | ICD-10-CM | POA: Diagnosis not present

## 2016-06-13 DIAGNOSIS — M47812 Spondylosis without myelopathy or radiculopathy, cervical region: Secondary | ICD-10-CM | POA: Diagnosis not present

## 2016-06-19 ENCOUNTER — Other Ambulatory Visit: Payer: Medicare Other

## 2016-06-20 DIAGNOSIS — F331 Major depressive disorder, recurrent, moderate: Secondary | ICD-10-CM | POA: Diagnosis not present

## 2016-06-21 DIAGNOSIS — G4733 Obstructive sleep apnea (adult) (pediatric): Secondary | ICD-10-CM | POA: Diagnosis not present

## 2016-07-03 ENCOUNTER — Other Ambulatory Visit: Payer: Medicare Other

## 2016-07-04 ENCOUNTER — Ambulatory Visit: Payer: Medicare Other | Admitting: Podiatry

## 2016-07-12 DIAGNOSIS — M47812 Spondylosis without myelopathy or radiculopathy, cervical region: Secondary | ICD-10-CM | POA: Diagnosis not present

## 2016-07-12 DIAGNOSIS — S39012A Strain of muscle, fascia and tendon of lower back, initial encounter: Secondary | ICD-10-CM | POA: Diagnosis not present

## 2016-07-12 DIAGNOSIS — M9902 Segmental and somatic dysfunction of thoracic region: Secondary | ICD-10-CM | POA: Diagnosis not present

## 2016-07-12 DIAGNOSIS — M9901 Segmental and somatic dysfunction of cervical region: Secondary | ICD-10-CM | POA: Diagnosis not present

## 2016-07-12 DIAGNOSIS — S29012A Strain of muscle and tendon of back wall of thorax, initial encounter: Secondary | ICD-10-CM | POA: Diagnosis not present

## 2016-07-12 DIAGNOSIS — M9903 Segmental and somatic dysfunction of lumbar region: Secondary | ICD-10-CM | POA: Diagnosis not present

## 2016-07-13 DIAGNOSIS — L570 Actinic keratosis: Secondary | ICD-10-CM | POA: Diagnosis not present

## 2016-07-13 DIAGNOSIS — L720 Epidermal cyst: Secondary | ICD-10-CM | POA: Diagnosis not present

## 2016-07-13 DIAGNOSIS — L821 Other seborrheic keratosis: Secondary | ICD-10-CM | POA: Diagnosis not present

## 2016-07-13 DIAGNOSIS — L820 Inflamed seborrheic keratosis: Secondary | ICD-10-CM | POA: Diagnosis not present

## 2016-07-23 ENCOUNTER — Other Ambulatory Visit: Payer: Self-pay | Admitting: *Deleted

## 2016-07-23 ENCOUNTER — Ambulatory Visit (INDEPENDENT_AMBULATORY_CARE_PROVIDER_SITE_OTHER): Payer: Medicare Other

## 2016-07-23 ENCOUNTER — Encounter: Payer: Self-pay | Admitting: Podiatry

## 2016-07-23 ENCOUNTER — Ambulatory Visit (INDEPENDENT_AMBULATORY_CARE_PROVIDER_SITE_OTHER): Payer: Medicare Other | Admitting: Podiatry

## 2016-07-23 VITALS — BP 132/85 | HR 72 | Resp 16

## 2016-07-23 DIAGNOSIS — G609 Hereditary and idiopathic neuropathy, unspecified: Secondary | ICD-10-CM | POA: Diagnosis not present

## 2016-07-23 DIAGNOSIS — M79672 Pain in left foot: Secondary | ICD-10-CM | POA: Diagnosis not present

## 2016-07-23 DIAGNOSIS — M5416 Radiculopathy, lumbar region: Secondary | ICD-10-CM

## 2016-07-23 DIAGNOSIS — M79671 Pain in right foot: Secondary | ICD-10-CM | POA: Diagnosis not present

## 2016-07-23 NOTE — Progress Notes (Signed)
   Subjective:    Patient ID: Olivia [name], female    DOB: 20-Feb-1951, 65 y.o.   MRN: [id number]  HPI    Review of Systems  Neurological: Positive for numbness.  All other systems reviewed and are negative.      Objective:   Physical Exam        Assessment & Plan:

## 2016-07-23 NOTE — Progress Notes (Signed)
Patient ID: Olivia [name], female   DOB: [birth year], 65 y.o.   MRN: [id number] Subjective:  Patient presents today with numbness and tingling to her bilateral feet which is been going on for several years now. Patient does relate a history of lumbar spine problems as well as the sacroiliac joint. Patient presents today for further treatment and evaluation    Objective/Physical Exam General: The patient is alert and oriented x3 in no acute distress.  Dermatology: Skin is warm, dry and supple bilateral lower extremities. Negative for open lesions or macerations.  Vascular: Palpable pedal pulses bilaterally. No edema or erythema noted. Capillary refill within normal limits.  Neurological: Negative Tinel sign. Diffuse numbness and tingling to the bilateral feet. Epicritic and protective threshold grossly intact bilaterally.   Musculoskeletal Exam: Range of motion within normal limits to all pedal and ankle joints bilateral. Muscle strength 5/5 in all groups bilateral.   Radiographic Exam:  Normal osseous mineralization. Joint spaces preserved. No fracture/dislocation/boney destruction.    Assessment: #1 lumbar radiculopathy #2 peripheral neuropathy-idiopathic #3 paresthesia bilateral feet   Plan of Care:  #1 Patient was evaluated. #2 today recommend MRI of lumbosacral spine. #3 orders placed for neurosurgeon spine specialist referral #4 patient is to return to clinic in 6 weeks   Dr. Edrick Kins, [place]

## 2016-07-24 ENCOUNTER — Telehealth: Payer: Self-pay | Admitting: *Deleted

## 2016-07-24 DIAGNOSIS — E039 Hypothyroidism, unspecified: Secondary | ICD-10-CM | POA: Diagnosis not present

## 2016-07-24 DIAGNOSIS — Z6836 Body mass index (BMI) 36.0-36.9, adult: Secondary | ICD-10-CM | POA: Diagnosis not present

## 2016-07-24 DIAGNOSIS — F329 Major depressive disorder, single episode, unspecified: Secondary | ICD-10-CM | POA: Diagnosis not present

## 2016-07-24 DIAGNOSIS — K219 Gastro-esophageal reflux disease without esophagitis: Secondary | ICD-10-CM | POA: Diagnosis not present

## 2016-07-24 DIAGNOSIS — I10 Essential (primary) hypertension: Secondary | ICD-10-CM | POA: Diagnosis not present

## 2016-07-24 DIAGNOSIS — G609 Hereditary and idiopathic neuropathy, unspecified: Secondary | ICD-10-CM

## 2016-07-24 DIAGNOSIS — M5416 Radiculopathy, lumbar region: Secondary | ICD-10-CM

## 2016-07-24 NOTE — Telephone Encounter (Addendum)
-----   Message from Edrick Kins, DPM sent at 07/23/2016  9:20 PM EDT ----- Regarding: MRI and Neurospine consult Please order MRI lumbosacral for patient.  Dx: lumbar radiculopathy. History of disc herniation  Please refer to Neurospine specialist.  Dx: peripheral neuropathy. Lumbar radiculopathy. H/o sacroiliac pain. H/o disc herniation. 07/24/2016-Faxed MRI orders to [place], and required referral form, pt clinicals and demographics to [facility]. 09/03/2016-Pt called 08/31/2016 but there was interference in the voicemail, sounds like a electric razor I got part of the birthdate and just [place] and part of the phone. I pieced together the birthday and then the phone and left message for pt to call and I would help set up for neurology appt. Pt called again left message stating she has an appt today with Dr. Amalia Hailey and will discuss with him and thanked me for my call.

## 2016-07-30 ENCOUNTER — Other Ambulatory Visit: Payer: Medicare Other

## 2016-08-01 DIAGNOSIS — F331 Major depressive disorder, recurrent, moderate: Secondary | ICD-10-CM | POA: Diagnosis not present

## 2016-08-04 ENCOUNTER — Ambulatory Visit
Admission: RE | Admit: 2016-08-04 | Discharge: 2016-08-04 | Disposition: A | Discharge status: Home / Self Care | Payer: Medicare Other | Source: Ambulatory Visit | Attending: Podiatry | Admitting: Podiatry

## 2016-08-04 DIAGNOSIS — M5126 Other intervertebral disc displacement, lumbar region: Secondary | ICD-10-CM | POA: Diagnosis not present

## 2016-08-04 DIAGNOSIS — M5137 Other intervertebral disc degeneration, lumbosacral region: Secondary | ICD-10-CM | POA: Diagnosis not present

## 2016-08-16 DIAGNOSIS — M9901 Segmental and somatic dysfunction of cervical region: Secondary | ICD-10-CM | POA: Diagnosis not present

## 2016-08-16 DIAGNOSIS — S29012A Strain of muscle and tendon of back wall of thorax, initial encounter: Secondary | ICD-10-CM | POA: Diagnosis not present

## 2016-08-16 DIAGNOSIS — S39012A Strain of muscle, fascia and tendon of lower back, initial encounter: Secondary | ICD-10-CM | POA: Diagnosis not present

## 2016-08-16 DIAGNOSIS — M9902 Segmental and somatic dysfunction of thoracic region: Secondary | ICD-10-CM | POA: Diagnosis not present

## 2016-08-16 DIAGNOSIS — M47812 Spondylosis without myelopathy or radiculopathy, cervical region: Secondary | ICD-10-CM | POA: Diagnosis not present

## 2016-08-16 DIAGNOSIS — M9903 Segmental and somatic dysfunction of lumbar region: Secondary | ICD-10-CM | POA: Diagnosis not present

## 2016-08-20 DIAGNOSIS — S29012A Strain of muscle and tendon of back wall of thorax, initial encounter: Secondary | ICD-10-CM | POA: Diagnosis not present

## 2016-08-20 DIAGNOSIS — M9901 Segmental and somatic dysfunction of cervical region: Secondary | ICD-10-CM | POA: Diagnosis not present

## 2016-08-20 DIAGNOSIS — M9903 Segmental and somatic dysfunction of lumbar region: Secondary | ICD-10-CM | POA: Diagnosis not present

## 2016-08-20 DIAGNOSIS — S39012A Strain of muscle, fascia and tendon of lower back, initial encounter: Secondary | ICD-10-CM | POA: Diagnosis not present

## 2016-08-20 DIAGNOSIS — M9902 Segmental and somatic dysfunction of thoracic region: Secondary | ICD-10-CM | POA: Diagnosis not present

## 2016-08-20 DIAGNOSIS — M47812 Spondylosis without myelopathy or radiculopathy, cervical region: Secondary | ICD-10-CM | POA: Diagnosis not present

## 2016-08-22 DIAGNOSIS — M47812 Spondylosis without myelopathy or radiculopathy, cervical region: Secondary | ICD-10-CM | POA: Diagnosis not present

## 2016-08-22 DIAGNOSIS — S29012A Strain of muscle and tendon of back wall of thorax, initial encounter: Secondary | ICD-10-CM | POA: Diagnosis not present

## 2016-08-22 DIAGNOSIS — S39012A Strain of muscle, fascia and tendon of lower back, initial encounter: Secondary | ICD-10-CM | POA: Diagnosis not present

## 2016-08-22 DIAGNOSIS — M9901 Segmental and somatic dysfunction of cervical region: Secondary | ICD-10-CM | POA: Diagnosis not present

## 2016-08-22 DIAGNOSIS — M9903 Segmental and somatic dysfunction of lumbar region: Secondary | ICD-10-CM | POA: Diagnosis not present

## 2016-08-22 DIAGNOSIS — M9902 Segmental and somatic dysfunction of thoracic region: Secondary | ICD-10-CM | POA: Diagnosis not present

## 2016-08-27 DIAGNOSIS — S39012A Strain of muscle, fascia and tendon of lower back, initial encounter: Secondary | ICD-10-CM | POA: Diagnosis not present

## 2016-08-27 DIAGNOSIS — S29012A Strain of muscle and tendon of back wall of thorax, initial encounter: Secondary | ICD-10-CM | POA: Diagnosis not present

## 2016-08-27 DIAGNOSIS — M9902 Segmental and somatic dysfunction of thoracic region: Secondary | ICD-10-CM | POA: Diagnosis not present

## 2016-08-27 DIAGNOSIS — M9901 Segmental and somatic dysfunction of cervical region: Secondary | ICD-10-CM | POA: Diagnosis not present

## 2016-08-27 DIAGNOSIS — M47812 Spondylosis without myelopathy or radiculopathy, cervical region: Secondary | ICD-10-CM | POA: Diagnosis not present

## 2016-08-27 DIAGNOSIS — M9903 Segmental and somatic dysfunction of lumbar region: Secondary | ICD-10-CM | POA: Diagnosis not present

## 2016-08-28 DIAGNOSIS — R202 Paresthesia of skin: Secondary | ICD-10-CM | POA: Diagnosis not present

## 2016-08-28 DIAGNOSIS — M545 Low back pain: Secondary | ICD-10-CM | POA: Diagnosis not present

## 2016-08-28 DIAGNOSIS — R200 Anesthesia of skin: Secondary | ICD-10-CM | POA: Diagnosis not present

## 2016-08-28 DIAGNOSIS — Z6836 Body mass index (BMI) 36.0-36.9, adult: Secondary | ICD-10-CM | POA: Diagnosis not present

## 2016-08-29 DIAGNOSIS — M9903 Segmental and somatic dysfunction of lumbar region: Secondary | ICD-10-CM | POA: Diagnosis not present

## 2016-08-29 DIAGNOSIS — M47812 Spondylosis without myelopathy or radiculopathy, cervical region: Secondary | ICD-10-CM | POA: Diagnosis not present

## 2016-08-29 DIAGNOSIS — M9902 Segmental and somatic dysfunction of thoracic region: Secondary | ICD-10-CM | POA: Diagnosis not present

## 2016-08-29 DIAGNOSIS — S29012A Strain of muscle and tendon of back wall of thorax, initial encounter: Secondary | ICD-10-CM | POA: Diagnosis not present

## 2016-08-29 DIAGNOSIS — M9901 Segmental and somatic dysfunction of cervical region: Secondary | ICD-10-CM | POA: Diagnosis not present

## 2016-08-29 DIAGNOSIS — S39012A Strain of muscle, fascia and tendon of lower back, initial encounter: Secondary | ICD-10-CM | POA: Diagnosis not present

## 2016-09-03 ENCOUNTER — Ambulatory Visit (INDEPENDENT_AMBULATORY_CARE_PROVIDER_SITE_OTHER): Payer: Medicare Other | Admitting: Podiatry

## 2016-09-03 DIAGNOSIS — M79672 Pain in left foot: Secondary | ICD-10-CM

## 2016-09-03 DIAGNOSIS — M5416 Radiculopathy, lumbar region: Secondary | ICD-10-CM | POA: Diagnosis not present

## 2016-09-03 DIAGNOSIS — M79671 Pain in right foot: Secondary | ICD-10-CM

## 2016-09-03 DIAGNOSIS — G609 Hereditary and idiopathic neuropathy, unspecified: Secondary | ICD-10-CM | POA: Diagnosis not present

## 2016-09-03 DIAGNOSIS — M7741 Metatarsalgia, right foot: Secondary | ICD-10-CM | POA: Diagnosis not present

## 2016-09-03 DIAGNOSIS — R202 Paresthesia of skin: Secondary | ICD-10-CM | POA: Diagnosis not present

## 2016-09-03 DIAGNOSIS — M7742 Metatarsalgia, left foot: Secondary | ICD-10-CM

## 2016-09-03 NOTE — Progress Notes (Signed)
Subjective:  Patient presents today for follow-up evaluation of lumbar radiculopathy as well as peripheral neuropathy to the bilateral lower extremities. Patient has a history of back problems. On last visit MRI of the lumbar spine was ordered. MRI negative for significant findings. Patient states that she didn't go and see Dr. Arnoldo Morale, neuro spine specialist. However, she felt that he was disinterested if she did not require surgery. Patient presents today for follow-up treatment and evaluation    Objective/Physical Exam General: The patient is alert and oriented x3 in no acute distress.  Dermatology: Skin is warm, dry and supple bilateral lower extremities. Negative for open lesions or macerations.  Vascular: Palpable pedal pulses bilaterally. No edema or erythema noted. Capillary refill within normal limits.  Neurological: Epicritic and protective threshold grossly intact bilaterally.   Musculoskeletal Exam: Range of motion within normal limits to all pedal and ankle joints bilateral. Muscle strength 5/5 in all groups bilateral.   Radiographic Exam:  Normal osseous mineralization. Joint spaces preserved. No fracture/dislocation/boney destruction.    Assessment: #1 lumbar radiculopathy #2 peripheral neuropathy secondary to lumbar radiculopathy #3 paresthesia bilateral feet-pans, needles, numbness #4 metatarsalgia bilateral feet  Plan of Care:  #1 Patient was evaluated. #2 today a great deal of time was utilized discussing all possible treatment modalities including chiropractic, acupuncture, physical therapy, oral medication #3 today the patient is going to opt for physical therapy. Prescription for physical therapy placed #4 offloading metatarsal pads dispensed to offload pain in the ball of the feet bilateral #5 return to clinic in 8 weeks   Dr. Edrick Kins, [place]

## 2016-09-04 DIAGNOSIS — S29012A Strain of muscle and tendon of back wall of thorax, initial encounter: Secondary | ICD-10-CM | POA: Diagnosis not present

## 2016-09-04 DIAGNOSIS — M9902 Segmental and somatic dysfunction of thoracic region: Secondary | ICD-10-CM | POA: Diagnosis not present

## 2016-09-04 DIAGNOSIS — M9901 Segmental and somatic dysfunction of cervical region: Secondary | ICD-10-CM | POA: Diagnosis not present

## 2016-09-04 DIAGNOSIS — S39012A Strain of muscle, fascia and tendon of lower back, initial encounter: Secondary | ICD-10-CM | POA: Diagnosis not present

## 2016-09-04 DIAGNOSIS — M47812 Spondylosis without myelopathy or radiculopathy, cervical region: Secondary | ICD-10-CM | POA: Diagnosis not present

## 2016-09-04 DIAGNOSIS — M9903 Segmental and somatic dysfunction of lumbar region: Secondary | ICD-10-CM | POA: Diagnosis not present

## 2016-09-05 ENCOUNTER — Telehealth: Payer: Self-pay | Admitting: *Deleted

## 2016-09-05 DIAGNOSIS — M5416 Radiculopathy, lumbar region: Secondary | ICD-10-CM

## 2016-09-05 DIAGNOSIS — R202 Paresthesia of skin: Secondary | ICD-10-CM

## 2016-09-05 NOTE — Telephone Encounter (Addendum)
-----   Message from Edrick Kins, DPM sent at 09/03/2016 10:41 PM EST ----- Regarding: Please arrange physical therapy - [facility] Please arrange physical therapy for patient 3x/week x 4 weeks.  Dx: lumbar radiculopathy, paresthesia bilateral feet  Thank you, Dr. Amalia Hailey. 09/04/2016-Orders to [facility] with LOV and demographics.

## 2016-09-17 DIAGNOSIS — S39012A Strain of muscle, fascia and tendon of lower back, initial encounter: Secondary | ICD-10-CM | POA: Diagnosis not present

## 2016-09-17 DIAGNOSIS — S29012A Strain of muscle and tendon of back wall of thorax, initial encounter: Secondary | ICD-10-CM | POA: Diagnosis not present

## 2016-09-17 DIAGNOSIS — M9901 Segmental and somatic dysfunction of cervical region: Secondary | ICD-10-CM | POA: Diagnosis not present

## 2016-09-17 DIAGNOSIS — M9903 Segmental and somatic dysfunction of lumbar region: Secondary | ICD-10-CM | POA: Diagnosis not present

## 2016-09-17 DIAGNOSIS — M9902 Segmental and somatic dysfunction of thoracic region: Secondary | ICD-10-CM | POA: Diagnosis not present

## 2016-09-17 DIAGNOSIS — M47812 Spondylosis without myelopathy or radiculopathy, cervical region: Secondary | ICD-10-CM | POA: Diagnosis not present

## 2016-09-28 DIAGNOSIS — R293 Abnormal posture: Secondary | ICD-10-CM | POA: Diagnosis not present

## 2016-09-28 DIAGNOSIS — M545 Low back pain: Secondary | ICD-10-CM | POA: Diagnosis not present

## 2016-09-28 DIAGNOSIS — M6249 Contracture of muscle, multiple sites: Secondary | ICD-10-CM | POA: Diagnosis not present

## 2016-09-28 DIAGNOSIS — M6281 Muscle weakness (generalized): Secondary | ICD-10-CM | POA: Diagnosis not present

## 2016-10-02 DIAGNOSIS — M9902 Segmental and somatic dysfunction of thoracic region: Secondary | ICD-10-CM | POA: Diagnosis not present

## 2016-10-02 DIAGNOSIS — M47812 Spondylosis without myelopathy or radiculopathy, cervical region: Secondary | ICD-10-CM | POA: Diagnosis not present

## 2016-10-02 DIAGNOSIS — S39012A Strain of muscle, fascia and tendon of lower back, initial encounter: Secondary | ICD-10-CM | POA: Diagnosis not present

## 2016-10-02 DIAGNOSIS — S29012A Strain of muscle and tendon of back wall of thorax, initial encounter: Secondary | ICD-10-CM | POA: Diagnosis not present

## 2016-10-02 DIAGNOSIS — M9903 Segmental and somatic dysfunction of lumbar region: Secondary | ICD-10-CM | POA: Diagnosis not present

## 2016-10-02 DIAGNOSIS — M9901 Segmental and somatic dysfunction of cervical region: Secondary | ICD-10-CM | POA: Diagnosis not present

## 2016-10-04 DIAGNOSIS — M9901 Segmental and somatic dysfunction of cervical region: Secondary | ICD-10-CM | POA: Diagnosis not present

## 2016-10-04 DIAGNOSIS — M9903 Segmental and somatic dysfunction of lumbar region: Secondary | ICD-10-CM | POA: Diagnosis not present

## 2016-10-04 DIAGNOSIS — M47812 Spondylosis without myelopathy or radiculopathy, cervical region: Secondary | ICD-10-CM | POA: Diagnosis not present

## 2016-10-04 DIAGNOSIS — S29012A Strain of muscle and tendon of back wall of thorax, initial encounter: Secondary | ICD-10-CM | POA: Diagnosis not present

## 2016-10-04 DIAGNOSIS — M9902 Segmental and somatic dysfunction of thoracic region: Secondary | ICD-10-CM | POA: Diagnosis not present

## 2016-10-04 DIAGNOSIS — S39012A Strain of muscle, fascia and tendon of lower back, initial encounter: Secondary | ICD-10-CM | POA: Diagnosis not present

## 2016-10-05 DIAGNOSIS — M6249 Contracture of muscle, multiple sites: Secondary | ICD-10-CM | POA: Diagnosis not present

## 2016-10-05 DIAGNOSIS — M6281 Muscle weakness (generalized): Secondary | ICD-10-CM | POA: Diagnosis not present

## 2016-10-05 DIAGNOSIS — R293 Abnormal posture: Secondary | ICD-10-CM | POA: Diagnosis not present

## 2016-10-05 DIAGNOSIS — M545 Low back pain: Secondary | ICD-10-CM | POA: Diagnosis not present

## 2016-10-09 DIAGNOSIS — M47812 Spondylosis without myelopathy or radiculopathy, cervical region: Secondary | ICD-10-CM | POA: Diagnosis not present

## 2016-10-09 DIAGNOSIS — M9903 Segmental and somatic dysfunction of lumbar region: Secondary | ICD-10-CM | POA: Diagnosis not present

## 2016-10-09 DIAGNOSIS — M5136 Other intervertebral disc degeneration, lumbar region: Secondary | ICD-10-CM | POA: Diagnosis not present

## 2016-10-09 DIAGNOSIS — M9901 Segmental and somatic dysfunction of cervical region: Secondary | ICD-10-CM | POA: Diagnosis not present

## 2016-10-09 DIAGNOSIS — S29012A Strain of muscle and tendon of back wall of thorax, initial encounter: Secondary | ICD-10-CM | POA: Diagnosis not present

## 2016-10-09 DIAGNOSIS — M9902 Segmental and somatic dysfunction of thoracic region: Secondary | ICD-10-CM | POA: Diagnosis not present

## 2016-10-16 DIAGNOSIS — M9902 Segmental and somatic dysfunction of thoracic region: Secondary | ICD-10-CM | POA: Diagnosis not present

## 2016-10-16 DIAGNOSIS — M9901 Segmental and somatic dysfunction of cervical region: Secondary | ICD-10-CM | POA: Diagnosis not present

## 2016-10-16 DIAGNOSIS — S29012A Strain of muscle and tendon of back wall of thorax, initial encounter: Secondary | ICD-10-CM | POA: Diagnosis not present

## 2016-10-16 DIAGNOSIS — M5136 Other intervertebral disc degeneration, lumbar region: Secondary | ICD-10-CM | POA: Diagnosis not present

## 2016-10-16 DIAGNOSIS — M47812 Spondylosis without myelopathy or radiculopathy, cervical region: Secondary | ICD-10-CM | POA: Diagnosis not present

## 2016-10-16 DIAGNOSIS — M9903 Segmental and somatic dysfunction of lumbar region: Secondary | ICD-10-CM | POA: Diagnosis not present

## 2016-10-22 DIAGNOSIS — M9901 Segmental and somatic dysfunction of cervical region: Secondary | ICD-10-CM | POA: Diagnosis not present

## 2016-10-22 DIAGNOSIS — M47812 Spondylosis without myelopathy or radiculopathy, cervical region: Secondary | ICD-10-CM | POA: Diagnosis not present

## 2016-10-22 DIAGNOSIS — S29012A Strain of muscle and tendon of back wall of thorax, initial encounter: Secondary | ICD-10-CM | POA: Diagnosis not present

## 2016-10-22 DIAGNOSIS — M9902 Segmental and somatic dysfunction of thoracic region: Secondary | ICD-10-CM | POA: Diagnosis not present

## 2016-10-22 DIAGNOSIS — M9903 Segmental and somatic dysfunction of lumbar region: Secondary | ICD-10-CM | POA: Diagnosis not present

## 2016-10-22 DIAGNOSIS — M5136 Other intervertebral disc degeneration, lumbar region: Secondary | ICD-10-CM | POA: Diagnosis not present

## 2016-10-23 DIAGNOSIS — M545 Low back pain: Secondary | ICD-10-CM | POA: Diagnosis not present

## 2016-10-23 DIAGNOSIS — M48061 Spinal stenosis, lumbar region without neurogenic claudication: Secondary | ICD-10-CM | POA: Diagnosis not present

## 2016-10-24 DIAGNOSIS — M47812 Spondylosis without myelopathy or radiculopathy, cervical region: Secondary | ICD-10-CM | POA: Diagnosis not present

## 2016-10-24 DIAGNOSIS — M9903 Segmental and somatic dysfunction of lumbar region: Secondary | ICD-10-CM | POA: Diagnosis not present

## 2016-10-24 DIAGNOSIS — M9902 Segmental and somatic dysfunction of thoracic region: Secondary | ICD-10-CM | POA: Diagnosis not present

## 2016-10-24 DIAGNOSIS — M5136 Other intervertebral disc degeneration, lumbar region: Secondary | ICD-10-CM | POA: Diagnosis not present

## 2016-10-24 DIAGNOSIS — M9901 Segmental and somatic dysfunction of cervical region: Secondary | ICD-10-CM | POA: Diagnosis not present

## 2016-10-24 DIAGNOSIS — S29012A Strain of muscle and tendon of back wall of thorax, initial encounter: Secondary | ICD-10-CM | POA: Diagnosis not present

## 2016-11-01 DIAGNOSIS — M9901 Segmental and somatic dysfunction of cervical region: Secondary | ICD-10-CM | POA: Diagnosis not present

## 2016-11-01 DIAGNOSIS — M9903 Segmental and somatic dysfunction of lumbar region: Secondary | ICD-10-CM | POA: Diagnosis not present

## 2016-11-01 DIAGNOSIS — M9902 Segmental and somatic dysfunction of thoracic region: Secondary | ICD-10-CM | POA: Diagnosis not present

## 2016-11-01 DIAGNOSIS — M47812 Spondylosis without myelopathy or radiculopathy, cervical region: Secondary | ICD-10-CM | POA: Diagnosis not present

## 2016-11-01 DIAGNOSIS — M5136 Other intervertebral disc degeneration, lumbar region: Secondary | ICD-10-CM | POA: Diagnosis not present

## 2016-11-01 DIAGNOSIS — S29012A Strain of muscle and tendon of back wall of thorax, initial encounter: Secondary | ICD-10-CM | POA: Diagnosis not present

## 2016-11-14 DIAGNOSIS — M545 Low back pain: Secondary | ICD-10-CM | POA: Diagnosis not present

## 2016-11-14 DIAGNOSIS — M48061 Spinal stenosis, lumbar region without neurogenic claudication: Secondary | ICD-10-CM | POA: Diagnosis not present

## 2016-12-03 ENCOUNTER — Ambulatory Visit: Payer: Medicare Other | Admitting: Podiatry

## 2016-12-12 DIAGNOSIS — S29012A Strain of muscle and tendon of back wall of thorax, initial encounter: Secondary | ICD-10-CM | POA: Diagnosis not present

## 2016-12-12 DIAGNOSIS — M5136 Other intervertebral disc degeneration, lumbar region: Secondary | ICD-10-CM | POA: Diagnosis not present

## 2016-12-12 DIAGNOSIS — M9902 Segmental and somatic dysfunction of thoracic region: Secondary | ICD-10-CM | POA: Diagnosis not present

## 2016-12-12 DIAGNOSIS — M9903 Segmental and somatic dysfunction of lumbar region: Secondary | ICD-10-CM | POA: Diagnosis not present

## 2016-12-12 DIAGNOSIS — M47812 Spondylosis without myelopathy or radiculopathy, cervical region: Secondary | ICD-10-CM | POA: Diagnosis not present

## 2016-12-12 DIAGNOSIS — M9901 Segmental and somatic dysfunction of cervical region: Secondary | ICD-10-CM | POA: Diagnosis not present

## 2016-12-24 DIAGNOSIS — M9901 Segmental and somatic dysfunction of cervical region: Secondary | ICD-10-CM | POA: Diagnosis not present

## 2016-12-24 DIAGNOSIS — M9903 Segmental and somatic dysfunction of lumbar region: Secondary | ICD-10-CM | POA: Diagnosis not present

## 2016-12-24 DIAGNOSIS — M9902 Segmental and somatic dysfunction of thoracic region: Secondary | ICD-10-CM | POA: Diagnosis not present

## 2016-12-24 DIAGNOSIS — M5136 Other intervertebral disc degeneration, lumbar region: Secondary | ICD-10-CM | POA: Diagnosis not present

## 2016-12-24 DIAGNOSIS — M47812 Spondylosis without myelopathy or radiculopathy, cervical region: Secondary | ICD-10-CM | POA: Diagnosis not present

## 2016-12-24 DIAGNOSIS — S29012A Strain of muscle and tendon of back wall of thorax, initial encounter: Secondary | ICD-10-CM | POA: Diagnosis not present

## 2016-12-25 DIAGNOSIS — M545 Low back pain: Secondary | ICD-10-CM | POA: Diagnosis not present

## 2016-12-25 DIAGNOSIS — M47896 Other spondylosis, lumbar region: Secondary | ICD-10-CM | POA: Diagnosis not present

## 2016-12-26 DIAGNOSIS — M461 Sacroiliitis, not elsewhere classified: Secondary | ICD-10-CM | POA: Diagnosis not present

## 2016-12-26 DIAGNOSIS — M25562 Pain in left knee: Secondary | ICD-10-CM | POA: Diagnosis not present

## 2016-12-26 DIAGNOSIS — M545 Low back pain: Secondary | ICD-10-CM | POA: Diagnosis not present

## 2017-01-03 DIAGNOSIS — M9901 Segmental and somatic dysfunction of cervical region: Secondary | ICD-10-CM | POA: Diagnosis not present

## 2017-01-03 DIAGNOSIS — M9903 Segmental and somatic dysfunction of lumbar region: Secondary | ICD-10-CM | POA: Diagnosis not present

## 2017-01-03 DIAGNOSIS — S29012A Strain of muscle and tendon of back wall of thorax, initial encounter: Secondary | ICD-10-CM | POA: Diagnosis not present

## 2017-01-03 DIAGNOSIS — M9902 Segmental and somatic dysfunction of thoracic region: Secondary | ICD-10-CM | POA: Diagnosis not present

## 2017-01-03 DIAGNOSIS — M47812 Spondylosis without myelopathy or radiculopathy, cervical region: Secondary | ICD-10-CM | POA: Diagnosis not present

## 2017-01-03 DIAGNOSIS — M5136 Other intervertebral disc degeneration, lumbar region: Secondary | ICD-10-CM | POA: Diagnosis not present

## 2017-01-04 DIAGNOSIS — M47896 Other spondylosis, lumbar region: Secondary | ICD-10-CM | POA: Diagnosis not present

## 2017-01-04 DIAGNOSIS — M545 Low back pain: Secondary | ICD-10-CM | POA: Diagnosis not present

## 2017-01-08 DIAGNOSIS — M545 Low back pain: Secondary | ICD-10-CM | POA: Diagnosis not present

## 2017-01-08 DIAGNOSIS — M47896 Other spondylosis, lumbar region: Secondary | ICD-10-CM | POA: Diagnosis not present

## 2017-01-09 DIAGNOSIS — M9901 Segmental and somatic dysfunction of cervical region: Secondary | ICD-10-CM | POA: Diagnosis not present

## 2017-01-09 DIAGNOSIS — M5136 Other intervertebral disc degeneration, lumbar region: Secondary | ICD-10-CM | POA: Diagnosis not present

## 2017-01-09 DIAGNOSIS — S29012A Strain of muscle and tendon of back wall of thorax, initial encounter: Secondary | ICD-10-CM | POA: Diagnosis not present

## 2017-01-09 DIAGNOSIS — M9903 Segmental and somatic dysfunction of lumbar region: Secondary | ICD-10-CM | POA: Diagnosis not present

## 2017-01-09 DIAGNOSIS — M9902 Segmental and somatic dysfunction of thoracic region: Secondary | ICD-10-CM | POA: Diagnosis not present

## 2017-01-09 DIAGNOSIS — M47812 Spondylosis without myelopathy or radiculopathy, cervical region: Secondary | ICD-10-CM | POA: Diagnosis not present

## 2017-01-11 DIAGNOSIS — M47896 Other spondylosis, lumbar region: Secondary | ICD-10-CM | POA: Diagnosis not present

## 2017-01-11 DIAGNOSIS — M545 Low back pain: Secondary | ICD-10-CM | POA: Diagnosis not present

## 2017-01-14 DIAGNOSIS — M47896 Other spondylosis, lumbar region: Secondary | ICD-10-CM | POA: Diagnosis not present

## 2017-01-14 DIAGNOSIS — M545 Low back pain: Secondary | ICD-10-CM | POA: Diagnosis not present

## 2017-01-15 DIAGNOSIS — M9902 Segmental and somatic dysfunction of thoracic region: Secondary | ICD-10-CM | POA: Diagnosis not present

## 2017-01-15 DIAGNOSIS — M47812 Spondylosis without myelopathy or radiculopathy, cervical region: Secondary | ICD-10-CM | POA: Diagnosis not present

## 2017-01-15 DIAGNOSIS — M9901 Segmental and somatic dysfunction of cervical region: Secondary | ICD-10-CM | POA: Diagnosis not present

## 2017-01-15 DIAGNOSIS — M9903 Segmental and somatic dysfunction of lumbar region: Secondary | ICD-10-CM | POA: Diagnosis not present

## 2017-01-15 DIAGNOSIS — M5136 Other intervertebral disc degeneration, lumbar region: Secondary | ICD-10-CM | POA: Diagnosis not present

## 2017-01-15 DIAGNOSIS — S29012A Strain of muscle and tendon of back wall of thorax, initial encounter: Secondary | ICD-10-CM | POA: Diagnosis not present

## 2017-01-17 DIAGNOSIS — M545 Low back pain: Secondary | ICD-10-CM | POA: Diagnosis not present

## 2017-01-17 DIAGNOSIS — M47896 Other spondylosis, lumbar region: Secondary | ICD-10-CM | POA: Diagnosis not present

## 2017-01-29 DIAGNOSIS — M25562 Pain in left knee: Secondary | ICD-10-CM | POA: Diagnosis not present

## 2017-01-29 DIAGNOSIS — M47896 Other spondylosis, lumbar region: Secondary | ICD-10-CM | POA: Diagnosis not present

## 2017-01-29 DIAGNOSIS — M545 Low back pain: Secondary | ICD-10-CM | POA: Diagnosis not present

## 2017-01-31 DIAGNOSIS — M9901 Segmental and somatic dysfunction of cervical region: Secondary | ICD-10-CM | POA: Diagnosis not present

## 2017-01-31 DIAGNOSIS — S29012A Strain of muscle and tendon of back wall of thorax, initial encounter: Secondary | ICD-10-CM | POA: Diagnosis not present

## 2017-01-31 DIAGNOSIS — M9903 Segmental and somatic dysfunction of lumbar region: Secondary | ICD-10-CM | POA: Diagnosis not present

## 2017-01-31 DIAGNOSIS — M9902 Segmental and somatic dysfunction of thoracic region: Secondary | ICD-10-CM | POA: Diagnosis not present

## 2017-01-31 DIAGNOSIS — M5136 Other intervertebral disc degeneration, lumbar region: Secondary | ICD-10-CM | POA: Diagnosis not present

## 2017-01-31 DIAGNOSIS — M47812 Spondylosis without myelopathy or radiculopathy, cervical region: Secondary | ICD-10-CM | POA: Diagnosis not present

## 2017-02-01 DIAGNOSIS — M25562 Pain in left knee: Secondary | ICD-10-CM | POA: Diagnosis not present

## 2017-02-01 DIAGNOSIS — M47896 Other spondylosis, lumbar region: Secondary | ICD-10-CM | POA: Diagnosis not present

## 2017-02-01 DIAGNOSIS — M545 Low back pain: Secondary | ICD-10-CM | POA: Diagnosis not present

## 2017-02-04 DIAGNOSIS — M25562 Pain in left knee: Secondary | ICD-10-CM | POA: Diagnosis not present

## 2017-02-04 DIAGNOSIS — M545 Low back pain: Secondary | ICD-10-CM | POA: Diagnosis not present

## 2017-02-04 DIAGNOSIS — M47896 Other spondylosis, lumbar region: Secondary | ICD-10-CM | POA: Diagnosis not present

## 2017-02-07 DIAGNOSIS — M545 Low back pain: Secondary | ICD-10-CM | POA: Diagnosis not present

## 2017-02-07 DIAGNOSIS — M25562 Pain in left knee: Secondary | ICD-10-CM | POA: Diagnosis not present

## 2017-02-07 DIAGNOSIS — M47896 Other spondylosis, lumbar region: Secondary | ICD-10-CM | POA: Diagnosis not present

## 2017-02-12 DIAGNOSIS — M545 Low back pain: Secondary | ICD-10-CM | POA: Diagnosis not present

## 2017-02-12 DIAGNOSIS — M48061 Spinal stenosis, lumbar region without neurogenic claudication: Secondary | ICD-10-CM | POA: Diagnosis not present

## 2017-02-12 DIAGNOSIS — M25562 Pain in left knee: Secondary | ICD-10-CM | POA: Diagnosis not present

## 2017-02-13 DIAGNOSIS — M545 Low back pain: Secondary | ICD-10-CM | POA: Diagnosis not present

## 2017-02-13 DIAGNOSIS — M25562 Pain in left knee: Secondary | ICD-10-CM | POA: Diagnosis not present

## 2017-02-13 DIAGNOSIS — M47896 Other spondylosis, lumbar region: Secondary | ICD-10-CM | POA: Diagnosis not present

## 2017-02-14 DIAGNOSIS — S29012A Strain of muscle and tendon of back wall of thorax, initial encounter: Secondary | ICD-10-CM | POA: Diagnosis not present

## 2017-02-14 DIAGNOSIS — M9903 Segmental and somatic dysfunction of lumbar region: Secondary | ICD-10-CM | POA: Diagnosis not present

## 2017-02-14 DIAGNOSIS — M9901 Segmental and somatic dysfunction of cervical region: Secondary | ICD-10-CM | POA: Diagnosis not present

## 2017-02-14 DIAGNOSIS — M47812 Spondylosis without myelopathy or radiculopathy, cervical region: Secondary | ICD-10-CM | POA: Diagnosis not present

## 2017-02-14 DIAGNOSIS — M5136 Other intervertebral disc degeneration, lumbar region: Secondary | ICD-10-CM | POA: Diagnosis not present

## 2017-02-14 DIAGNOSIS — M9902 Segmental and somatic dysfunction of thoracic region: Secondary | ICD-10-CM | POA: Diagnosis not present

## 2017-02-19 DIAGNOSIS — M545 Low back pain: Secondary | ICD-10-CM | POA: Diagnosis not present

## 2017-02-19 DIAGNOSIS — M25562 Pain in left knee: Secondary | ICD-10-CM | POA: Diagnosis not present

## 2017-02-19 DIAGNOSIS — M47896 Other spondylosis, lumbar region: Secondary | ICD-10-CM | POA: Diagnosis not present

## 2017-02-20 ENCOUNTER — Encounter: Payer: Self-pay | Admitting: Neurology

## 2017-02-21 DIAGNOSIS — M47896 Other spondylosis, lumbar region: Secondary | ICD-10-CM | POA: Diagnosis not present

## 2017-02-21 DIAGNOSIS — M25562 Pain in left knee: Secondary | ICD-10-CM | POA: Diagnosis not present

## 2017-02-21 DIAGNOSIS — M545 Low back pain: Secondary | ICD-10-CM | POA: Diagnosis not present

## 2017-02-26 ENCOUNTER — Encounter: Payer: Self-pay | Admitting: Neurology

## 2017-02-26 ENCOUNTER — Ambulatory Visit (INDEPENDENT_AMBULATORY_CARE_PROVIDER_SITE_OTHER): Payer: Medicare Other | Admitting: Neurology

## 2017-02-26 VITALS — BP 120/80 | HR 68 | Ht 67.0 in | Wt 245.1 lb

## 2017-02-26 DIAGNOSIS — G629 Polyneuropathy, unspecified: Secondary | ICD-10-CM

## 2017-02-26 MED ORDER — PREGABALIN 100 MG PO CAPS: 100.0000 mg | ORAL_CAPSULE | Freq: Two times a day (BID) | ORAL | 11 refills | Status: DC

## 2017-02-26 NOTE — Patient Instructions (Addendum)
1.  NCS/EMG of the legs 2.  Check labs 3.  Start Lyrica 75mg  twice daily x 1 week, then increase to 100mg  twice daily  Return to clinic in 4 months

## 2017-02-26 NOTE — Progress Notes (Signed)
[facility] Clinic Note - Initial Visit   Date: 02/26/17  Olivia [name] MRN: [id number] DOB: [birth year]   Dear Dr. Ron Agee:  Thank you for your kind referral of RHEN DOSSANTOS for consultation of bilateral feet pain. Although her history is well known to you, please allow [name] to reiterate it for the purpose of our medical record. The patient was accompanied to the clinic by self.    History of Present Illness: Olivia [name] is a 66 y.o. right-handed Caucasian female with hypertension, hypothyroidism, chronic low back pain, and anxiety/depression presenting for evaluation of bilateral feet pain.    Starting around 2015, she started developing numbness of the toes and occasionally has stabbing pain. She denies any radicular leg pain.  Pain is constant and worse at night time when she is laying down.  She had some relief with wearing metatarsal pads.  She went to see a podiatrist who suggested her pain may be stemming from her low back so underwent bilateral L5-S1 transforaminal injection which helped her low back pain, but did not provide any relief for her feet discomfort.  She was offered a trial of gabapentin and did not notice any improvement.  She has mild improvement with Lyrica 29m twice daily.  She denies any weakness or imbalance.  She also going to physical therapy for her low back pain.    She has no history of diabetes, alcoholism, or family history of neuropathy.   Out-side paper records, electronic medical record, and images have been reviewed where available and summarized as:  MRI lumbar spine wo contrast 08/05/2016: 1. Progressive disc degeneration at L4-5 with mild bilateral lateral recess stenosis and mild right and mild-to-moderate left neural foraminal stenosis. 2. Regressed L5-S1 disc protrusion without residual lateral recess stenosis. 3. Mild interval disc degeneration at L1-2 and L2-3 without stenosis.   Past Medical History:  Diagnosis Date   . Hypertension   . Thyroid disease    Nodules  . Varicose veins     Past Surgical History:  Procedure Laterality Date  . CHOLECYSTECTOMY  2003   Gall Bladder  . EYE SURGERY Right    Cataract  . EYE SURGERY Left    Cataract  . fibroid adenoma  1989 and 19991     Medications:  Outpatient Encounter Prescriptions as of 02/26/2017  Medication Sig Note  . atenolol (TENORMIN) 25 MG tablet  07/23/2016: Received from: External Pharmacy  . busPIRone (BUSPAR) 15 MG tablet Take 15 mg by mouth 2 (two) times daily.    . calcium-vitamin D (OSCAL WITH D) 500-200 MG-UNIT per tablet Take 1 tablet by mouth daily.   . clonazePAM (KLONOPIN) 1 MG tablet Take 1 mg by mouth 3 (three) times daily as needed. anxiety   . DULoxetine (CYMBALTA) 60 MG capsule Take 60 mg by mouth daily.   .Marland Kitchenesomeprazole (NEXIUM) 40 MG capsule Take 40 mg by mouth daily before breakfast.   . HYDROcodone-acetaminophen (NORCO/VICODIN) 5-325 MG tablet  07/23/2016: Received from: External Pharmacy  . ibuprofen (ADVIL,MOTRIN) 200 MG tablet Take 400 mg by mouth every 6 (six) hours as needed. pain   . lisinopril-hydrochlorothiazide (PRINZIDE,ZESTORETIC) 20-25 MG per tablet Take 1 tablet by mouth daily.    . methocarbamol (ROBAXIN) 500 MG tablet Take 500 mg by mouth 4 (four) times daily as needed. spasms   . mirtazapine (REMERON) 30 MG tablet Take 45 mg by mouth at bedtime. 12/06/2011: Takes one and one-half tablet  . Multiple Vitamin (MULITIVITAMIN WITH MINERALS)  TABS Take 1 tablet by mouth daily.   Marland Kitchen SYNTHROID 125 MCG tablet  07/23/2016: Received from: External Pharmacy  . vitamin C (ASCORBIC ACID) 500 MG tablet Take 500 mg by mouth daily.    No facility-administered encounter medications on file as of 02/26/2017.      Allergies:  Allergies  Allergen Reactions  . Penicillins Other (See Comments)    Unknown childhood allergy    Family History: Family History  Problem Relation Age of Onset  . Dementia Mother   . Hypertension  Father     Social History: Social History  Substance Use Topics  . Smoking status: Never Smoker  . Smokeless tobacco: Never Used  . Alcohol use No   Social History   Social History Narrative   Patient lives with son in a 3 story townhouse.  Has 2 sons.  On disability.  Education: college.    Review of Systems:  CONSTITUTIONAL: No fevers, chills, night sweats, or weight loss.   EYES: No visual changes or eye pain ENT: No hearing changes.  No history of nose bleeds.   RESPIRATORY: No cough, wheezing and shortness of breath.   CARDIOVASCULAR: Negative for chest pain, and palpitations.   GI: Negative for abdominal discomfort, blood in stools or black stools.  No recent change in bowel habits.   GU:  No history of incontinence.   MUSCLOSKELETAL: +history of joint pain or swelling.  No myalgias.   SKIN: Negative for lesions, rash, and itching.   HEMATOLOGY/ONCOLOGY: Negative for prolonged bleeding, bruising easily, and swollen nodes.  No history of cancer.   ENDOCRINE: Negative for cold or heat intolerance, polydipsia or goiter.   PSYCH:  +depression +anxiety symptoms.   NEURO: As Above.   Vital Signs:  BP 120/80   Pulse 68   Ht '5\' 7"'  (1.702 m)   Wt 245 lb 2 oz (111.2 kg)   SpO2 98%   BMI 38.39 kg/m    General Medical Exam:   General:  Well appearing, comfortable.   Eyes/ENT: see cranial nerve examination.   Neck: No masses appreciated.  Full range of motion without tenderness.  No carotid bruits. Respiratory:  Clear to auscultation, good air entry bilaterally.   Cardiac:  Regular rate and rhythm, no murmur.   Extremities:  No deformities, edema, or skin discoloration.  Skin:  No rashes or lesions.  Neurological Exam: MENTAL STATUS including orientation to time, place, person, recent and remote memory, attention span and concentration, language, and fund of knowledge is normal.  Speech is not dysarthric.  CRANIAL NERVES: II:  No visual field defects.  Unremarkable  fundi.   III-IV-VI: Pupils equal round and reactive to light.  Normal conjugate, extra-ocular eye movements in all directions of gaze.  No nystagmus.  No ptosis.   V:  Normal facial sensation.     VII:  Normal facial symmetry and movements.  No pathologic facial reflexes.  VIII:  Normal hearing and vestibular function.   IX-X:  Normal palatal movement.   XI:  Normal shoulder shrug and head rotation.   XII:  Normal tongue strength and range of motion, no deviation or fasciculation.  MOTOR:  No atrophy, fasciculations or abnormal movements.  No pronator drift.  Tone is normal.    Right Upper Extremity:    Left Upper Extremity:    Deltoid  5/5   Deltoid  5/5   Biceps  5/5   Biceps  5/5   Triceps  5/5   Triceps  5/5  Wrist extensors  5/5   Wrist extensors  5/5   Wrist flexors  5/5   Wrist flexors  5/5   Finger extensors  5/5   Finger extensors  5/5   Finger flexors  5/5   Finger flexors  5/5   Dorsal interossei  5/5   Dorsal interossei  5/5   Abductor pollicis  5/5   Abductor pollicis  5/5   Tone (Ashworth scale)  0  Tone (Ashworth scale)  0   Right Lower Extremity:    Left Lower Extremity:    Hip flexors  5/5   Hip flexors  5/5   Hip extensors  5/5   Hip extensors  5/5   Knee flexors  5/5   Knee flexors  5/5   Knee extensors  5/5   Knee extensors  5/5   Dorsiflexors  5/5   Dorsiflexors  5/5   Plantarflexors  5/5   Plantarflexors  5/5   Toe extensors  5/5   Toe extensors  5/5   Toe flexors  5/5   Toe flexors  5/5   Tone (Ashworth scale)  0  Tone (Ashworth scale)  0   MSRs:  Right                                                                 Left brachioradialis 2+  brachioradialis 2+  biceps 2+  biceps 2+  triceps 2+  triceps 2+  patellar 2+  patellar 2+  ankle jerk 2+  ankle jerk 2+  Hoffman no  Hoffman no  plantar response down  plantar response down   SENSORY:  Vibration is reduced to 50% at the great toe bilaterally; pin prick and temperature is reduced distally over  the plantar aspect of the feet.  Light touch and proprioception.  Romberg's sign absent.   COORDINATION/GAIT: Normal finger-to- nose-finger.  Intact rapid alternating movements bilaterally. Gait wide-based and stable, unassisted. She is unsteady with tandem and stressed gait.    IMPRESSION: Ms. Whitehead is a 66 year-old female referred for evaluation of bilateral feet paresthesias.  Her neurological examination shows a distal predominant small and large peripheral neuropathy. I had extensive discussion with the patient regarding the pathogenesis, etiology, management, and natural course of neuropathy. Neuropathy tends to be slowly progressive, especially if a treatable etiology is not identified.  I would like to test for treatable causes of neuropathy. I discussed that in the vast majority of cases, despite checking for reversible causes, we are unable to find the underlying etiology and management is symptomatic.    PLAN/RECOMMENDATIONS:  1.  NCS/EMG of the legs 2.  Check ESR, copper, vitamin B12, TSH, SPEP with IFE 3.  Increase Lyrica to 35m twice daily x 1 week (samples provided), then 1066mtwice daily  Return to clinic in 4 months.   The duration of this appointment visit was 35 minutes of face-to-face time with the patient.  Greater than 50% of this time was spent in counseling, explanation of diagnosis, planning of further management, and coordination of care.   Thank you for allowing me to participate in patient's care.  If I can answer any additional questions, I would be pleased to do so.    Sincerely,    Maudry Zeidan K. PaPosey ProntoDO

## 2017-02-27 DIAGNOSIS — S29012A Strain of muscle and tendon of back wall of thorax, initial encounter: Secondary | ICD-10-CM | POA: Diagnosis not present

## 2017-02-27 DIAGNOSIS — M5136 Other intervertebral disc degeneration, lumbar region: Secondary | ICD-10-CM | POA: Diagnosis not present

## 2017-02-27 DIAGNOSIS — M9903 Segmental and somatic dysfunction of lumbar region: Secondary | ICD-10-CM | POA: Diagnosis not present

## 2017-02-27 DIAGNOSIS — M47812 Spondylosis without myelopathy or radiculopathy, cervical region: Secondary | ICD-10-CM | POA: Diagnosis not present

## 2017-02-27 DIAGNOSIS — M9902 Segmental and somatic dysfunction of thoracic region: Secondary | ICD-10-CM | POA: Diagnosis not present

## 2017-02-27 DIAGNOSIS — M9901 Segmental and somatic dysfunction of cervical region: Secondary | ICD-10-CM | POA: Diagnosis not present

## 2017-02-28 DIAGNOSIS — M47896 Other spondylosis, lumbar region: Secondary | ICD-10-CM | POA: Diagnosis not present

## 2017-02-28 DIAGNOSIS — M545 Low back pain: Secondary | ICD-10-CM | POA: Diagnosis not present

## 2017-02-28 DIAGNOSIS — M25562 Pain in left knee: Secondary | ICD-10-CM | POA: Diagnosis not present

## 2017-03-04 ENCOUNTER — Telehealth: Payer: Self-pay | Admitting: Neurology

## 2017-03-04 ENCOUNTER — Other Ambulatory Visit: Payer: Self-pay | Admitting: *Deleted

## 2017-03-04 DIAGNOSIS — F331 Major depressive disorder, recurrent, moderate: Secondary | ICD-10-CM | POA: Diagnosis not present

## 2017-03-04 MED ORDER — PREGABALIN 100 MG PO CAPS: 100.0000 mg | ORAL_CAPSULE | Freq: Two times a day (BID) | ORAL | 11 refills | Status: DC

## 2017-03-04 NOTE — Telephone Encounter (Signed)
Will re-fax Rx.

## 2017-03-04 NOTE — Telephone Encounter (Signed)
PT called and said her prescription for Olivia [name] has not been called into the pharmacy

## 2017-03-08 DIAGNOSIS — M47896 Other spondylosis, lumbar region: Secondary | ICD-10-CM | POA: Diagnosis not present

## 2017-03-08 DIAGNOSIS — M545 Low back pain: Secondary | ICD-10-CM | POA: Diagnosis not present

## 2017-03-08 DIAGNOSIS — M25562 Pain in left knee: Secondary | ICD-10-CM | POA: Diagnosis not present

## 2017-03-11 ENCOUNTER — Telehealth: Payer: Self-pay | Admitting: Neurology

## 2017-03-11 NOTE — Telephone Encounter (Signed)
Called patient back and she just had a couple of questions about taking meds before EMG.

## 2017-03-11 NOTE — Telephone Encounter (Signed)
Left message for patient to call me back if she still has questions for me.

## 2017-03-11 NOTE — Telephone Encounter (Signed)
PT called and said she has some questions about her EMG tomorrow

## 2017-03-11 NOTE — Telephone Encounter (Signed)
Patient would like you to call her back.  She has some more questions.

## 2017-03-12 ENCOUNTER — Ambulatory Visit (INDEPENDENT_AMBULATORY_CARE_PROVIDER_SITE_OTHER): Payer: Medicare Other | Admitting: Neurology

## 2017-03-12 ENCOUNTER — Other Ambulatory Visit (INDEPENDENT_AMBULATORY_CARE_PROVIDER_SITE_OTHER): Payer: Medicare Other

## 2017-03-12 DIAGNOSIS — G629 Polyneuropathy, unspecified: Secondary | ICD-10-CM

## 2017-03-12 DIAGNOSIS — M5417 Radiculopathy, lumbosacral region: Secondary | ICD-10-CM

## 2017-03-12 LAB — SEDIMENTATION RATE: SED RATE: 32 mm/h — AB (ref 0–30)

## 2017-03-12 LAB — TSH: TSH: 1.87 u[IU]/mL (ref 0.35–4.50)

## 2017-03-12 LAB — VITAMIN B12: Vitamin B-12: 407 pg/mL (ref 211–911)

## 2017-03-12 NOTE — Procedures (Signed)
[facility]  [address]  [place], [place] [address] Tel: [phone] Fax:  [phone] Test Date:  03/12/2017  Patient: Olivia [name] DOB: 1950/11/15 Physician: Narda Amber, DO  Sex: Female Height: 5\' 7"  Ref Phys: Narda Amber, DO  ID#: [id number] Temp: 33.6C Technician:    Patient Complaints: This is a 66 year-old female referred for evaluation of feet paresthesias, worse on the right.  NCV & EMG Findings: Extensive electrodiagnostic testing of the right lower extremity and additional studies of the left shows:  1. Bilateral sural and superficial peroneal sensory responses are within normal limits. 2. Bilateral peroneal and tibial motor responses are within normal limits. 3. Bilateral tibial H reflex studies are mildly prolonged. 4. Chronic motor axon loss changes are seen affecting L4-S1 myotomes bilaterally, without accompanied active denervation.  Impression: 1. Chronic L4-L5 radiculopathy affecting bilateral lower extremities, moderate in degree electrically. 2. Chronic S1 radiculopathy affecting bilateral lower extremities, mild in degree electrically. 3. There is no evidence of a generalized sensorimotor polyneuropathy affecting the lower extremities.   ___________________________ Narda Amber, DO    Nerve Conduction Studies Anti Sensory Summary Table   Site NR Peak (ms) Norm Peak (ms) P-T Amp (V) Norm P-T Amp  Left Sup Peroneal Anti Sensory (Ant Lat Mall)  12 cm    3.1 <4.6 4.2 >3  Right Sup Peroneal Anti Sensory (Ant Lat Mall)  12 cm    2.3 <4.6 5.2 >3  Left Sural Anti Sensory (Lat Mall)  Calf    3.6 <4.6 6.2 >3  Right Sural Anti Sensory (Lat Mall)  Calf    3.7 <4.6 9.9 >3   Motor Summary Table   Site NR Onset (ms) Norm Onset (ms) O-P Amp (mV) Norm O-P Amp Site1 Site2 Delta-0 (ms) Dist (cm) Vel (m/s) Norm Vel (m/s)  Left Peroneal Motor (Ext Dig Brev)  Ankle    3.6 <6.0 4.1 >2.5 B Fib Ankle 7.8 36.0 46 >40  B Fib    11.4  4.1  Poplt  B Fib 2.0 10.0 50 >40  Poplt    13.4  3.9         Right Peroneal Motor (Ext Dig Brev)  Ankle    3.1 <6.0 4.7 >2.5 B Fib Ankle 7.2 37.0 51 >40  B Fib    10.3  4.1  Poplt B Fib 1.3 10.0 77 >40  Poplt    11.6  4.1         Left Tibial Motor (Abd Hall Brev)  Ankle    5.6 <6.0 8.5 >4 Knee Ankle 7.3 39.0 53 >40  Knee    12.9  5.5         Right Tibial Motor (Abd Hall Brev)  Ankle    3.2 <6.0 8.0 >4 Knee Ankle 9.1 38.0 42 >40  Knee    12.3  4.4          H Reflex Studies   NR H-Lat (ms) Lat Norm (ms) L-R H-Lat (ms)  Left Tibial (Gastroc)     35.51 <35 0.00  Right Tibial (Gastroc)     35.51 <35 0.00   EMG   Side Muscle Ins Act Fibs Psw Fasc Number Recrt Dur Dur. Amp Amp. Poly Poly. Comment  Right AntTibialis Nml Nml Nml Nml 1- Rapid Some 1+ Some 1+ Nml Nml N/A  Right Flex Dig Long Nml Nml Nml Nml 2- Rapid Some 1+ Some 1+ Nml Nml N/A  Right BicepsFemS Nml Nml Nml Nml 1- Rapid  Few 1+ Few 1+ Nml Nml N/A  Right RectFemoris Nml Nml Nml Nml 1- Rapid Some 1+ Some 1+ Nml Nml N/A  Right GluteusMed Nml Nml Nml Nml 1- Rapid Some 1+ Some 1+ Nml Nml N/A  Right AdductorLong Nml Nml Nml Nml Nml Nml Nml Nml Nml Nml Nml Nml N/A  Left GluteusMed Nml Nml Nml Nml 1- Rapid Some 1+ Some 1+ Nml Nml N/A  Left BicepsFemS Nml Nml Nml Nml 2- Rapid Few 1+ Few 1+ Nml Nml N/A  Left AntTibialis Nml Nml Nml Nml 1- Rapid Some 1+ Some 1+ Nml Nml N/A  Left Gastroc Nml Nml Nml Nml 1- Rapid Some 1+ Some 1+ Nml Nml N/A  Left Flex Dig Long Nml Nml Nml Nml 1- Rapid Some 1+ Some 1+ Nml Nml N/A  Left RectFemoris Nml Nml Nml Nml 1- Rapid Some 1+ Some 1+ Nml Nml N/A      Waveforms:

## 2017-03-13 DIAGNOSIS — M25562 Pain in left knee: Secondary | ICD-10-CM | POA: Diagnosis not present

## 2017-03-13 DIAGNOSIS — M47896 Other spondylosis, lumbar region: Secondary | ICD-10-CM | POA: Diagnosis not present

## 2017-03-13 DIAGNOSIS — M545 Low back pain: Secondary | ICD-10-CM | POA: Diagnosis not present

## 2017-03-14 LAB — PROTEIN ELECTROPHORESIS, SERUM
ALBUMIN ELP: 3.7 g/dL — AB (ref 3.8–4.8)
ALPHA-1-GLOBULIN: 0.6 g/dL — AB (ref 0.2–0.3)
ALPHA-2-GLOBULIN: 0.8 g/dL (ref 0.5–0.9)
BETA 2: 0.3 g/dL (ref 0.2–0.5)
BETA GLOBULIN: 0.5 g/dL (ref 0.4–0.6)
Gamma Globulin: 0.9 g/dL (ref 0.8–1.7)
Total Protein, Serum Electrophoresis: 6.8 g/dL (ref 6.1–8.1)

## 2017-03-14 LAB — IMMUNOFIXATION ELECTROPHORESIS
IGM, SERUM: 65 mg/dL (ref 48–271)
IgA: 191 mg/dL (ref 81–463)
IgG (Immunoglobin G), Serum: 872 mg/dL (ref 694–1618)

## 2017-03-14 LAB — COPPER, SERUM: COPPER: 118 ug/dL (ref 70–175)

## 2017-03-15 ENCOUNTER — Telehealth: Payer: Self-pay | Admitting: *Deleted

## 2017-03-15 DIAGNOSIS — M25562 Pain in left knee: Secondary | ICD-10-CM | POA: Diagnosis not present

## 2017-03-15 DIAGNOSIS — M47896 Other spondylosis, lumbar region: Secondary | ICD-10-CM | POA: Diagnosis not present

## 2017-03-15 DIAGNOSIS — M545 Low back pain: Secondary | ICD-10-CM | POA: Diagnosis not present

## 2017-03-15 MED ORDER — PREGABALIN 75 MG PO CAPS: 75.0000 mg | ORAL_CAPSULE | Freq: Two times a day (BID) | ORAL | 5 refills | Status: DC

## 2017-03-15 NOTE — Telephone Encounter (Signed)
-----   Message from [name], DO sent at 03/14/2017  4:48 PM EDT ----- Please inform patient that labs are normal and her EMG does not show evidence of neuropathy.  She does have nerve impingement in her back which is most likely causing her symptoms.  Recommend that she start PT for back strengthening to see if this helps.

## 2017-03-15 NOTE — Telephone Encounter (Signed)
I called patient and gave her the results and instructions.  She said that she is already doing PT but will continue.  She also said that the lyrica 100 mg bid was making her sick so she would like to go back to the 75 mg.  Rx printed and I will fax to pharmacy.

## 2017-03-15 NOTE — Telephone Encounter (Signed)
Noted  

## 2017-03-20 DIAGNOSIS — M47896 Other spondylosis, lumbar region: Secondary | ICD-10-CM | POA: Diagnosis not present

## 2017-03-20 DIAGNOSIS — M545 Low back pain: Secondary | ICD-10-CM | POA: Diagnosis not present

## 2017-03-20 DIAGNOSIS — M25562 Pain in left knee: Secondary | ICD-10-CM | POA: Diagnosis not present

## 2017-03-22 ENCOUNTER — Other Ambulatory Visit: Payer: Self-pay | Admitting: Family Medicine

## 2017-03-22 DIAGNOSIS — Z1231 Encounter for screening mammogram for malignant neoplasm of breast: Secondary | ICD-10-CM

## 2017-03-25 DIAGNOSIS — M47812 Spondylosis without myelopathy or radiculopathy, cervical region: Secondary | ICD-10-CM | POA: Diagnosis not present

## 2017-03-25 DIAGNOSIS — M9901 Segmental and somatic dysfunction of cervical region: Secondary | ICD-10-CM | POA: Diagnosis not present

## 2017-03-25 DIAGNOSIS — M9903 Segmental and somatic dysfunction of lumbar region: Secondary | ICD-10-CM | POA: Diagnosis not present

## 2017-03-25 DIAGNOSIS — S29012A Strain of muscle and tendon of back wall of thorax, initial encounter: Secondary | ICD-10-CM | POA: Diagnosis not present

## 2017-03-25 DIAGNOSIS — M9902 Segmental and somatic dysfunction of thoracic region: Secondary | ICD-10-CM | POA: Diagnosis not present

## 2017-03-25 DIAGNOSIS — M5136 Other intervertebral disc degeneration, lumbar region: Secondary | ICD-10-CM | POA: Diagnosis not present

## 2017-03-29 DIAGNOSIS — M25562 Pain in left knee: Secondary | ICD-10-CM | POA: Diagnosis not present

## 2017-03-29 DIAGNOSIS — M47896 Other spondylosis, lumbar region: Secondary | ICD-10-CM | POA: Diagnosis not present

## 2017-03-29 DIAGNOSIS — M545 Low back pain: Secondary | ICD-10-CM | POA: Diagnosis not present

## 2017-04-04 ENCOUNTER — Ambulatory Visit: Payer: Medicare Other

## 2017-04-09 DIAGNOSIS — M47896 Other spondylosis, lumbar region: Secondary | ICD-10-CM | POA: Diagnosis not present

## 2017-04-09 DIAGNOSIS — M545 Low back pain: Secondary | ICD-10-CM | POA: Diagnosis not present

## 2017-04-09 DIAGNOSIS — M25562 Pain in left knee: Secondary | ICD-10-CM | POA: Diagnosis not present

## 2017-04-10 DIAGNOSIS — M5136 Other intervertebral disc degeneration, lumbar region: Secondary | ICD-10-CM | POA: Diagnosis not present

## 2017-04-10 DIAGNOSIS — M9902 Segmental and somatic dysfunction of thoracic region: Secondary | ICD-10-CM | POA: Diagnosis not present

## 2017-04-10 DIAGNOSIS — M9901 Segmental and somatic dysfunction of cervical region: Secondary | ICD-10-CM | POA: Diagnosis not present

## 2017-04-10 DIAGNOSIS — M47812 Spondylosis without myelopathy or radiculopathy, cervical region: Secondary | ICD-10-CM | POA: Diagnosis not present

## 2017-04-10 DIAGNOSIS — S29012A Strain of muscle and tendon of back wall of thorax, initial encounter: Secondary | ICD-10-CM | POA: Diagnosis not present

## 2017-04-10 DIAGNOSIS — M9903 Segmental and somatic dysfunction of lumbar region: Secondary | ICD-10-CM | POA: Diagnosis not present

## 2017-04-11 DIAGNOSIS — M545 Low back pain: Secondary | ICD-10-CM | POA: Diagnosis not present

## 2017-04-11 DIAGNOSIS — M25562 Pain in left knee: Secondary | ICD-10-CM | POA: Diagnosis not present

## 2017-04-11 DIAGNOSIS — M47896 Other spondylosis, lumbar region: Secondary | ICD-10-CM | POA: Diagnosis not present

## 2017-04-23 DIAGNOSIS — M47896 Other spondylosis, lumbar region: Secondary | ICD-10-CM | POA: Diagnosis not present

## 2017-04-23 DIAGNOSIS — M25562 Pain in left knee: Secondary | ICD-10-CM | POA: Diagnosis not present

## 2017-04-23 DIAGNOSIS — M545 Low back pain: Secondary | ICD-10-CM | POA: Diagnosis not present

## 2017-04-25 DIAGNOSIS — K219 Gastro-esophageal reflux disease without esophagitis: Secondary | ICD-10-CM | POA: Diagnosis not present

## 2017-04-25 DIAGNOSIS — Z6837 Body mass index (BMI) 37.0-37.9, adult: Secondary | ICD-10-CM | POA: Diagnosis not present

## 2017-04-25 DIAGNOSIS — E039 Hypothyroidism, unspecified: Secondary | ICD-10-CM | POA: Diagnosis not present

## 2017-04-25 DIAGNOSIS — I10 Essential (primary) hypertension: Secondary | ICD-10-CM | POA: Diagnosis not present

## 2017-04-29 DIAGNOSIS — M170 Bilateral primary osteoarthritis of knee: Secondary | ICD-10-CM | POA: Diagnosis not present

## 2017-04-30 DIAGNOSIS — M25562 Pain in left knee: Secondary | ICD-10-CM | POA: Diagnosis not present

## 2017-04-30 DIAGNOSIS — M47896 Other spondylosis, lumbar region: Secondary | ICD-10-CM | POA: Diagnosis not present

## 2017-04-30 DIAGNOSIS — M545 Low back pain: Secondary | ICD-10-CM | POA: Diagnosis not present

## 2017-05-15 DIAGNOSIS — M9901 Segmental and somatic dysfunction of cervical region: Secondary | ICD-10-CM | POA: Diagnosis not present

## 2017-05-15 DIAGNOSIS — S29012A Strain of muscle and tendon of back wall of thorax, initial encounter: Secondary | ICD-10-CM | POA: Diagnosis not present

## 2017-05-15 DIAGNOSIS — M5136 Other intervertebral disc degeneration, lumbar region: Secondary | ICD-10-CM | POA: Diagnosis not present

## 2017-05-15 DIAGNOSIS — M9902 Segmental and somatic dysfunction of thoracic region: Secondary | ICD-10-CM | POA: Diagnosis not present

## 2017-05-15 DIAGNOSIS — M9903 Segmental and somatic dysfunction of lumbar region: Secondary | ICD-10-CM | POA: Diagnosis not present

## 2017-05-15 DIAGNOSIS — M47812 Spondylosis without myelopathy or radiculopathy, cervical region: Secondary | ICD-10-CM | POA: Diagnosis not present

## 2017-05-16 ENCOUNTER — Ambulatory Visit: Payer: Medicare Other

## 2017-05-17 ENCOUNTER — Ambulatory Visit
Admission: RE | Admit: 2017-05-17 | Discharge: 2017-05-17 | Disposition: A | Discharge status: Home / Self Care | Payer: Medicare Other | Source: Ambulatory Visit | Attending: Family Medicine | Admitting: Family Medicine

## 2017-05-17 ENCOUNTER — Other Ambulatory Visit: Payer: Self-pay | Admitting: Family Medicine

## 2017-05-17 DIAGNOSIS — Z0000 Encounter for general adult medical examination without abnormal findings: Secondary | ICD-10-CM

## 2017-05-17 DIAGNOSIS — Z1231 Encounter for screening mammogram for malignant neoplasm of breast: Secondary | ICD-10-CM

## 2017-05-28 DIAGNOSIS — M170 Bilateral primary osteoarthritis of knee: Secondary | ICD-10-CM | POA: Diagnosis not present

## 2017-06-04 DIAGNOSIS — H04123 Dry eye syndrome of bilateral lacrimal glands: Secondary | ICD-10-CM | POA: Diagnosis not present

## 2017-06-06 DIAGNOSIS — M9901 Segmental and somatic dysfunction of cervical region: Secondary | ICD-10-CM | POA: Diagnosis not present

## 2017-06-06 DIAGNOSIS — S29012A Strain of muscle and tendon of back wall of thorax, initial encounter: Secondary | ICD-10-CM | POA: Diagnosis not present

## 2017-06-06 DIAGNOSIS — M47812 Spondylosis without myelopathy or radiculopathy, cervical region: Secondary | ICD-10-CM | POA: Diagnosis not present

## 2017-06-06 DIAGNOSIS — M5136 Other intervertebral disc degeneration, lumbar region: Secondary | ICD-10-CM | POA: Diagnosis not present

## 2017-06-06 DIAGNOSIS — M9902 Segmental and somatic dysfunction of thoracic region: Secondary | ICD-10-CM | POA: Diagnosis not present

## 2017-06-06 DIAGNOSIS — M9903 Segmental and somatic dysfunction of lumbar region: Secondary | ICD-10-CM | POA: Diagnosis not present

## 2017-06-06 DIAGNOSIS — M1711 Unilateral primary osteoarthritis, right knee: Secondary | ICD-10-CM | POA: Diagnosis not present

## 2017-06-13 DIAGNOSIS — M1711 Unilateral primary osteoarthritis, right knee: Secondary | ICD-10-CM | POA: Diagnosis not present

## 2017-06-20 DIAGNOSIS — M9903 Segmental and somatic dysfunction of lumbar region: Secondary | ICD-10-CM | POA: Diagnosis not present

## 2017-06-20 DIAGNOSIS — M9902 Segmental and somatic dysfunction of thoracic region: Secondary | ICD-10-CM | POA: Diagnosis not present

## 2017-06-20 DIAGNOSIS — M47812 Spondylosis without myelopathy or radiculopathy, cervical region: Secondary | ICD-10-CM | POA: Diagnosis not present

## 2017-06-20 DIAGNOSIS — M9901 Segmental and somatic dysfunction of cervical region: Secondary | ICD-10-CM | POA: Diagnosis not present

## 2017-06-20 DIAGNOSIS — M5136 Other intervertebral disc degeneration, lumbar region: Secondary | ICD-10-CM | POA: Diagnosis not present

## 2017-06-20 DIAGNOSIS — S29012A Strain of muscle and tendon of back wall of thorax, initial encounter: Secondary | ICD-10-CM | POA: Diagnosis not present

## 2017-06-25 DIAGNOSIS — K5730 Diverticulosis of large intestine without perforation or abscess without bleeding: Secondary | ICD-10-CM | POA: Diagnosis not present

## 2017-06-25 DIAGNOSIS — K640 First degree hemorrhoids: Secondary | ICD-10-CM | POA: Diagnosis not present

## 2017-06-25 DIAGNOSIS — Z85038 Personal history of other malignant neoplasm of large intestine: Secondary | ICD-10-CM | POA: Diagnosis not present

## 2017-06-25 HISTORY — PX: COLONOSCOPY: SHX174

## 2017-07-04 DIAGNOSIS — M5136 Other intervertebral disc degeneration, lumbar region: Secondary | ICD-10-CM | POA: Diagnosis not present

## 2017-07-04 DIAGNOSIS — M9902 Segmental and somatic dysfunction of thoracic region: Secondary | ICD-10-CM | POA: Diagnosis not present

## 2017-07-04 DIAGNOSIS — S29012A Strain of muscle and tendon of back wall of thorax, initial encounter: Secondary | ICD-10-CM | POA: Diagnosis not present

## 2017-07-04 DIAGNOSIS — M9903 Segmental and somatic dysfunction of lumbar region: Secondary | ICD-10-CM | POA: Diagnosis not present

## 2017-07-04 DIAGNOSIS — M9901 Segmental and somatic dysfunction of cervical region: Secondary | ICD-10-CM | POA: Diagnosis not present

## 2017-07-04 DIAGNOSIS — M47812 Spondylosis without myelopathy or radiculopathy, cervical region: Secondary | ICD-10-CM | POA: Diagnosis not present

## 2017-07-16 DIAGNOSIS — M48061 Spinal stenosis, lumbar region without neurogenic claudication: Secondary | ICD-10-CM | POA: Diagnosis not present

## 2017-07-16 DIAGNOSIS — M545 Low back pain: Secondary | ICD-10-CM | POA: Diagnosis not present

## 2017-07-22 ENCOUNTER — Ambulatory Visit: Payer: Medicare Other | Admitting: Neurology

## 2017-07-29 DIAGNOSIS — F331 Major depressive disorder, recurrent, moderate: Secondary | ICD-10-CM | POA: Diagnosis not present

## 2017-08-12 DIAGNOSIS — M9902 Segmental and somatic dysfunction of thoracic region: Secondary | ICD-10-CM | POA: Diagnosis not present

## 2017-08-12 DIAGNOSIS — M9901 Segmental and somatic dysfunction of cervical region: Secondary | ICD-10-CM | POA: Diagnosis not present

## 2017-08-12 DIAGNOSIS — M9903 Segmental and somatic dysfunction of lumbar region: Secondary | ICD-10-CM | POA: Diagnosis not present

## 2017-08-12 DIAGNOSIS — S29012A Strain of muscle and tendon of back wall of thorax, initial encounter: Secondary | ICD-10-CM | POA: Diagnosis not present

## 2017-08-12 DIAGNOSIS — M47812 Spondylosis without myelopathy or radiculopathy, cervical region: Secondary | ICD-10-CM | POA: Diagnosis not present

## 2017-08-12 DIAGNOSIS — M5136 Other intervertebral disc degeneration, lumbar region: Secondary | ICD-10-CM | POA: Diagnosis not present

## 2017-08-28 ENCOUNTER — Telehealth: Payer: Self-pay | Admitting: Neurology

## 2017-08-28 NOTE — Telephone Encounter (Signed)
Patient lmom regarding needing to see if her Lab Work can be re submitted. Her Insurance is not wanting to pay because of the TSH [id number] CODE and [id number] CODE. She said they are telling her that the Insurance is needing Dr. Posey Pronto to tell them that it was Medically necessary to have those labs drawn. Please Advise. Thanks

## 2017-09-05 DIAGNOSIS — M25561 Pain in right knee: Secondary | ICD-10-CM | POA: Diagnosis not present

## 2017-09-11 ENCOUNTER — Other Ambulatory Visit: Payer: Self-pay | Admitting: Neurology

## 2017-09-12 ENCOUNTER — Other Ambulatory Visit: Payer: Self-pay | Admitting: *Deleted

## 2017-09-12 DIAGNOSIS — M47812 Spondylosis without myelopathy or radiculopathy, cervical region: Secondary | ICD-10-CM | POA: Diagnosis not present

## 2017-09-12 DIAGNOSIS — S29012A Strain of muscle and tendon of back wall of thorax, initial encounter: Secondary | ICD-10-CM | POA: Diagnosis not present

## 2017-09-12 DIAGNOSIS — M9901 Segmental and somatic dysfunction of cervical region: Secondary | ICD-10-CM | POA: Diagnosis not present

## 2017-09-12 DIAGNOSIS — M9902 Segmental and somatic dysfunction of thoracic region: Secondary | ICD-10-CM | POA: Diagnosis not present

## 2017-09-12 DIAGNOSIS — M5136 Other intervertebral disc degeneration, lumbar region: Secondary | ICD-10-CM | POA: Diagnosis not present

## 2017-09-12 DIAGNOSIS — M9903 Segmental and somatic dysfunction of lumbar region: Secondary | ICD-10-CM | POA: Diagnosis not present

## 2017-09-12 MED ORDER — PREGABALIN 75 MG PO CAPS: 75.0000 mg | ORAL_CAPSULE | Freq: Two times a day (BID) | ORAL | 5 refills | Status: DC

## 2017-09-12 NOTE — Telephone Encounter (Signed)
Waiting for [facility] to send over form for new dx.

## 2017-09-19 DIAGNOSIS — M48061 Spinal stenosis, lumbar region without neurogenic claudication: Secondary | ICD-10-CM | POA: Diagnosis not present

## 2017-09-19 DIAGNOSIS — M545 Low back pain: Secondary | ICD-10-CM | POA: Diagnosis not present

## 2017-10-08 DIAGNOSIS — S29012A Strain of muscle and tendon of back wall of thorax, initial encounter: Secondary | ICD-10-CM | POA: Diagnosis not present

## 2017-10-08 DIAGNOSIS — M9903 Segmental and somatic dysfunction of lumbar region: Secondary | ICD-10-CM | POA: Diagnosis not present

## 2017-10-08 DIAGNOSIS — M5136 Other intervertebral disc degeneration, lumbar region: Secondary | ICD-10-CM | POA: Diagnosis not present

## 2017-10-08 DIAGNOSIS — M9901 Segmental and somatic dysfunction of cervical region: Secondary | ICD-10-CM | POA: Diagnosis not present

## 2017-10-08 DIAGNOSIS — M47812 Spondylosis without myelopathy or radiculopathy, cervical region: Secondary | ICD-10-CM | POA: Diagnosis not present

## 2017-10-08 DIAGNOSIS — M9902 Segmental and somatic dysfunction of thoracic region: Secondary | ICD-10-CM | POA: Diagnosis not present

## 2017-10-10 DIAGNOSIS — M48061 Spinal stenosis, lumbar region without neurogenic claudication: Secondary | ICD-10-CM | POA: Diagnosis not present

## 2017-10-24 ENCOUNTER — Telehealth: Payer: Self-pay

## 2017-10-24 DIAGNOSIS — J069 Acute upper respiratory infection, unspecified: Secondary | ICD-10-CM | POA: Diagnosis not present

## 2017-10-24 DIAGNOSIS — I10 Essential (primary) hypertension: Secondary | ICD-10-CM | POA: Diagnosis not present

## 2017-10-24 DIAGNOSIS — R9431 Abnormal electrocardiogram [ECG] [EKG]: Secondary | ICD-10-CM | POA: Diagnosis not present

## 2017-10-24 DIAGNOSIS — M25561 Pain in right knee: Secondary | ICD-10-CM | POA: Diagnosis not present

## 2017-10-24 DIAGNOSIS — Z01818 Encounter for other preprocedural examination: Secondary | ICD-10-CM | POA: Diagnosis not present

## 2017-10-24 NOTE — Telephone Encounter (Signed)
Sent referral to scheduling 

## 2017-10-25 ENCOUNTER — Telehealth: Payer: Self-pay

## 2017-10-25 NOTE — Telephone Encounter (Signed)
   Primary Cardiologist: No primary care provider on file.  Chart reviewed as part of pre-operative protocol coverage. Received request for pre-op clearance for right total knee replacement. This patent has never been seen by our clinic. Pre-op call back staff, please call the requesting doctor's office to notify them to find out if they intended for the patient to be scheduled for a new patient appointment - if that's the case, please do so.   Charlie Pitter, PA-C 10/25/2017, 2:38 PM

## 2017-10-25 NOTE — Telephone Encounter (Signed)
   [facility] Pre-operative Risk Assessment    Request for surgical clearance:  1. What type of surgery is being performed? Right total knee replacement  2. When is this surgery scheduled? 11-22-2017   3. Are there any medications that need to be held prior to surgery and how long? N/A   4. Practice name and name of physician performing surgery?  [facility] - Dr. Earlie Server  5. What is your office phone and fax number? Phone: [phone] Fax: [phone]  6. Anesthesia type (None, local, MAC, general) ? N/A   Olivia [name] 10/25/2017, 11:15 AM  _________________________________________________________________   (provider comments below)

## 2017-10-28 NOTE — Telephone Encounter (Signed)
Patient is already scheduled for a new patient appointment with Cecilie Kicks, NP on 11/14/17 at 3:00 PM for Pre-op Clearance.   Called and let Dr. Alvester Morin office know that she is scheduled for 2/14 for pre-op clearance.   Called patient as well and instructed her to keep her appointment in order get clearance for her surgery. Patient verbalized understanding and thanked me for the call.

## 2017-10-29 DIAGNOSIS — M9901 Segmental and somatic dysfunction of cervical region: Secondary | ICD-10-CM | POA: Diagnosis not present

## 2017-10-29 DIAGNOSIS — S29012A Strain of muscle and tendon of back wall of thorax, initial encounter: Secondary | ICD-10-CM | POA: Diagnosis not present

## 2017-10-29 DIAGNOSIS — M9902 Segmental and somatic dysfunction of thoracic region: Secondary | ICD-10-CM | POA: Diagnosis not present

## 2017-10-29 DIAGNOSIS — M9903 Segmental and somatic dysfunction of lumbar region: Secondary | ICD-10-CM | POA: Diagnosis not present

## 2017-10-29 DIAGNOSIS — M47812 Spondylosis without myelopathy or radiculopathy, cervical region: Secondary | ICD-10-CM | POA: Diagnosis not present

## 2017-10-29 DIAGNOSIS — M48061 Spinal stenosis, lumbar region without neurogenic claudication: Secondary | ICD-10-CM | POA: Diagnosis not present

## 2017-10-29 DIAGNOSIS — M5136 Other intervertebral disc degeneration, lumbar region: Secondary | ICD-10-CM | POA: Diagnosis not present

## 2017-11-06 ENCOUNTER — Ambulatory Visit: Payer: Self-pay | Admitting: Physician Assistant

## 2017-11-06 ENCOUNTER — Encounter: Payer: Self-pay | Admitting: Cardiology

## 2017-11-06 NOTE — H&P (View-Only) (Signed)
TOTAL KNEE ADMISSION H&P  Patient is being admitted for right total knee arthroplasty.  Subjective:  Chief Complaint:right knee pain.  HPI: Olivia [name], 67 y.o. female, has a history of pain and functional disability in the right knee due to arthritis and has failed non-surgical conservative treatments for greater than 12 weeks to includeNSAID's and/or analgesics, corticosteriod injections, viscosupplementation injections and activity modification.  Onset of symptoms was gradual, starting 6 years ago with gradually worsening course since that time. The patient noted no past surgery on the right knee(s).  Patient currently rates pain in the right knee(s) at 8 out of 10 with activity. Patient has night pain, worsening of pain with activity and weight bearing, pain that interferes with activities of daily living, pain with passive range of motion, crepitus and joint swelling.  Patient has evidence of periarticular osteophytes and joint space narrowing by imaging studies. There is no active infection.  Patient Active Problem List   Diagnosis Date Noted  . Varicose veins of leg with complications 08/65/7846  . Varicose veins of lower extremities with other complications 96/29/5284  . Swelling of limb-Right Leg 06/16/2014  . Pain of right lower extremity 06/16/2014   Past Medical History:  Diagnosis Date  . Hypertension   . Hypothyroidism   . Thyroid disease    Nodules  . Varicose veins     Past Surgical History:  Procedure Laterality Date  . BREAST EXCISIONAL BIOPSY Left    benign  . CHOLECYSTECTOMY  2003   Gall Bladder  . EYE SURGERY Right    Cataract  . EYE SURGERY Left    Cataract  . fibroid adenoma  1989 and 19991    Current Outpatient Medications  Medication Sig Dispense Refill Last Dose  . atenolol (TENORMIN) 25 MG tablet Take 25 mg by mouth daily.    Taking  . busPIRone (BUSPAR) 30 MG tablet Take 30 mg by mouth 2 (two) times daily.    Taking  . calcium-vitamin D (OSCAL  WITH D) 500-200 MG-UNIT per tablet Take 1 tablet by mouth daily.   Taking  . clonazePAM (KLONOPIN) 1 MG tablet Take 0.5-1 mg by mouth See admin instructions. 1 mg 3 times daily , and 0.5 mg at 5:00p   Taking  . DULoxetine (CYMBALTA) 60 MG capsule Take 120 mg by mouth daily.    Taking  . esomeprazole (NEXIUM) 40 MG capsule Take 40 mg by mouth 2 (two) times daily before a meal.    Taking  . HYDROcodone-acetaminophen (NORCO/VICODIN) 5-325 MG tablet Take 1 tablet by mouth every 4 (four) hours as needed for moderate pain.    Taking  . lisinopril-hydrochlorothiazide (PRINZIDE,ZESTORETIC) 20-25 MG per tablet Take 1 tablet by mouth daily.    Taking  . methocarbamol (ROBAXIN) 500 MG tablet Take 500 mg by mouth 4 (four) times daily as needed. spasms   Taking  . mirtazapine (REMERON) 30 MG tablet Take 30 mg by mouth at bedtime.    Taking  . Multiple Vitamin (MULITIVITAMIN WITH MINERALS) TABS Take 1 tablet by mouth daily.   Taking  . pregabalin (LYRICA) 75 MG capsule Take 1 capsule (75 mg total) by mouth 2 (two) times daily. 60 capsule 5   . SYNTHROID 125 MCG tablet Take 125 mcg by mouth daily before breakfast.    Taking  . vitamin C (ASCORBIC ACID) 500 MG tablet Take 500 mg by mouth daily.   Taking   No current facility-administered medications for this visit.    Allergies  Allergen Reactions  . Demerol [Meperidine] Nausea And Vomiting  . Penicillins Other (See Comments)    Unknown childhood allergy Has patient had a PCN reaction causing immediate rash, facial/tongue/throat swelling, SOB or lightheadedness with hypotension: Unknown Has patient had a PCN reaction causing severe rash involving mucus membranes or skin necrosis: Unknown Has patient had a PCN reaction that required hospitalization: Unknown Has patient had a PCN reaction occurring within the last 10 years: No If all of the above answers are "NO", then may proceed with Cephalosporin use.     Social History   Tobacco Use  . Smoking  status: Never Smoker  . Smokeless tobacco: Never Used  Substance Use Topics  . Alcohol use: No    Family History  Problem Relation Age of Onset  . Dementia Mother   . Hypertension Father   . Healthy Son      Review of Systems  Musculoskeletal: Positive for joint pain.  Psychiatric/Behavioral: The patient is nervous/anxious.   All other systems reviewed and are negative.   Objective:  Physical Exam  Constitutional: She is oriented to person, place, and time. She appears well-developed and well-nourished. No distress.  HENT:  Head: Normocephalic and atraumatic.  Nose: Nose normal.  Eyes: Conjunctivae and EOM are normal. Pupils are equal, round, and reactive to light.  Neck: Normal range of motion. Neck supple.  Cardiovascular: Normal rate, regular rhythm, normal heart sounds and intact distal pulses.  Respiratory: Effort normal and breath sounds normal. No respiratory distress. She has no wheezes.  GI: Soft. Bowel sounds are normal. She exhibits no distension. There is no tenderness.  Musculoskeletal:       Right knee: She exhibits swelling. She exhibits no effusion and no erythema. Tenderness found. Medial joint line and lateral joint line tenderness noted.  Lymphadenopathy:    She has no cervical adenopathy.  Neurological: She is alert and oriented to person, place, and time. No cranial nerve deficit.  Skin: Skin is warm and dry. No rash noted. No erythema.  Psychiatric: She has a normal mood and affect. Her behavior is normal.    Vital signs in last 24 hours: @VSRANGES @  Labs:   Estimated body mass index is 38.39 kg/m as calculated from the following:   Height as of 02/26/17: 5\' 7"  (1.702 m).   Weight as of 02/26/17: 111.2 kg (245 lb 2 oz).   Imaging Review Plain radiographs demonstrate moderate degenerative joint disease of the right knee(s). The overall alignment issignificant varus. The bone quality appears to be good for age and reported activity  level.  Assessment/Plan:  End stage arthritis, right knee   The patient history, physical examination, clinical judgment of the provider and imaging studies are consistent with end stage degenerative joint disease of the right knee(s) and total knee arthroplasty is deemed medically necessary. The treatment options including medical management, injection therapy arthroscopy and arthroplasty were discussed at length. The risks and benefits of total knee arthroplasty were presented and reviewed. The risks due to aseptic loosening, infection, stiffness, patella tracking problems, thromboembolic complications and other imponderables were discussed. The patient acknowledged the explanation, agreed to proceed with the plan and consent was signed. Patient is being admitted for inpatient treatment for surgery, pain control, PT, OT, prophylactic antibiotics, VTE prophylaxis, progressive ambulation and ADL's and discharge planning. The patient is planning to be discharged to skilled nursing facility

## 2017-11-06 NOTE — H&P (Signed)
TOTAL KNEE ADMISSION H&P  Patient is being admitted for right total knee arthroplasty.  Subjective:  Chief Complaint:right knee pain.  HPI: Olivia [name], 67 y.o. female, has a history of pain and functional disability in the right knee due to arthritis and has failed non-surgical conservative treatments for greater than 12 weeks to includeNSAID's and/or analgesics, corticosteriod injections, viscosupplementation injections and activity modification.  Onset of symptoms was gradual, starting 6 years ago with gradually worsening course since that time. The patient noted no past surgery on the right knee(s).  Patient currently rates pain in the right knee(s) at 8 out of 10 with activity. Patient has night pain, worsening of pain with activity and weight bearing, pain that interferes with activities of daily living, pain with passive range of motion, crepitus and joint swelling.  Patient has evidence of periarticular osteophytes and joint space narrowing by imaging studies. There is no active infection.  Patient Active Problem List   Diagnosis Date Noted  . Varicose veins of leg with complications 98/33/8250  . Varicose veins of lower extremities with other complications 53/97/6734  . Swelling of limb-Right Leg 06/16/2014  . Pain of right lower extremity 06/16/2014   Past Medical History:  Diagnosis Date  . Hypertension   . Hypothyroidism   . Thyroid disease    Nodules  . Varicose veins     Past Surgical History:  Procedure Laterality Date  . BREAST EXCISIONAL BIOPSY Left    benign  . CHOLECYSTECTOMY  2003   Gall Bladder  . EYE SURGERY Right    Cataract  . EYE SURGERY Left    Cataract  . fibroid adenoma  1989 and 19991    Current Outpatient Medications  Medication Sig Dispense Refill Last Dose  . atenolol (TENORMIN) 25 MG tablet Take 25 mg by mouth daily.    Taking  . busPIRone (BUSPAR) 30 MG tablet Take 30 mg by mouth 2 (two) times daily.    Taking  . calcium-vitamin D (OSCAL  WITH D) 500-200 MG-UNIT per tablet Take 1 tablet by mouth daily.   Taking  . clonazePAM (KLONOPIN) 1 MG tablet Take 0.5-1 mg by mouth See admin instructions. 1 mg 3 times daily , and 0.5 mg at 5:00p   Taking  . DULoxetine (CYMBALTA) 60 MG capsule Take 120 mg by mouth daily.    Taking  . esomeprazole (NEXIUM) 40 MG capsule Take 40 mg by mouth 2 (two) times daily before a meal.    Taking  . HYDROcodone-acetaminophen (NORCO/VICODIN) 5-325 MG tablet Take 1 tablet by mouth every 4 (four) hours as needed for moderate pain.    Taking  . lisinopril-hydrochlorothiazide (PRINZIDE,ZESTORETIC) 20-25 MG per tablet Take 1 tablet by mouth daily.    Taking  . methocarbamol (ROBAXIN) 500 MG tablet Take 500 mg by mouth 4 (four) times daily as needed. spasms   Taking  . mirtazapine (REMERON) 30 MG tablet Take 30 mg by mouth at bedtime.    Taking  . Multiple Vitamin (MULITIVITAMIN WITH MINERALS) TABS Take 1 tablet by mouth daily.   Taking  . pregabalin (LYRICA) 75 MG capsule Take 1 capsule (75 mg total) by mouth 2 (two) times daily. 60 capsule 5   . SYNTHROID 125 MCG tablet Take 125 mcg by mouth daily before breakfast.    Taking  . vitamin C (ASCORBIC ACID) 500 MG tablet Take 500 mg by mouth daily.   Taking   No current facility-administered medications for this visit.    Allergies  Allergen Reactions  . Demerol [Meperidine] Nausea And Vomiting  . Penicillins Other (See Comments)    Unknown childhood allergy Has patient had a PCN reaction causing immediate rash, facial/tongue/throat swelling, SOB or lightheadedness with hypotension: Unknown Has patient had a PCN reaction causing severe rash involving mucus membranes or skin necrosis: Unknown Has patient had a PCN reaction that required hospitalization: Unknown Has patient had a PCN reaction occurring within the last 10 years: No If all of the above answers are "NO", then may proceed with Cephalosporin use.     Social History   Tobacco Use  . Smoking  status: Never Smoker  . Smokeless tobacco: Never Used  Substance Use Topics  . Alcohol use: No    Family History  Problem Relation Age of Onset  . Dementia Mother   . Hypertension Father   . Healthy Son      Review of Systems  Musculoskeletal: Positive for joint pain.  Psychiatric/Behavioral: The patient is nervous/anxious.   All other systems reviewed and are negative.   Objective:  Physical Exam  Constitutional: She is oriented to person, place, and time. She appears well-developed and well-nourished. No distress.  HENT:  Head: Normocephalic and atraumatic.  Nose: Nose normal.  Eyes: Conjunctivae and EOM are normal. Pupils are equal, round, and reactive to light.  Neck: Normal range of motion. Neck supple.  Cardiovascular: Normal rate, regular rhythm, normal heart sounds and intact distal pulses.  Respiratory: Effort normal and breath sounds normal. No respiratory distress. She has no wheezes.  GI: Soft. Bowel sounds are normal. She exhibits no distension. There is no tenderness.  Musculoskeletal:       Right knee: She exhibits swelling. She exhibits no effusion and no erythema. Tenderness found. Medial joint line and lateral joint line tenderness noted.  Lymphadenopathy:    She has no cervical adenopathy.  Neurological: She is alert and oriented to person, place, and time. No cranial nerve deficit.  Skin: Skin is warm and dry. No rash noted. No erythema.  Psychiatric: She has a normal mood and affect. Her behavior is normal.    Vital signs in last 24 hours: @VSRANGES @  Labs:   Estimated body mass index is 38.39 kg/m as calculated from the following:   Height as of 02/26/17: 5\' 7"  (1.702 m).   Weight as of 02/26/17: 111.2 kg (245 lb 2 oz).   Imaging Review Plain radiographs demonstrate moderate degenerative joint disease of the right knee(s). The overall alignment issignificant varus. The bone quality appears to be good for age and reported activity  level.  Assessment/Plan:  End stage arthritis, right knee   The patient history, physical examination, clinical judgment of the provider and imaging studies are consistent with end stage degenerative joint disease of the right knee(s) and total knee arthroplasty is deemed medically necessary. The treatment options including medical management, injection therapy arthroscopy and arthroplasty were discussed at length. The risks and benefits of total knee arthroplasty were presented and reviewed. The risks due to aseptic loosening, infection, stiffness, patella tracking problems, thromboembolic complications and other imponderables were discussed. The patient acknowledged the explanation, agreed to proceed with the plan and consent was signed. Patient is being admitted for inpatient treatment for surgery, pain control, PT, OT, prophylactic antibiotics, VTE prophylaxis, progressive ambulation and ADL's and discharge planning. The patient is planning to be discharged to skilled nursing facility

## 2017-11-08 NOTE — Pre-Procedure Instructions (Signed)
Olivia [name]  11/08/2017      [facility] - [place], [place] - [address] AT [place] [place] [address] Phone: [phone] Fax: [phone]  [facility] - [place], [place] [address] Phone: [phone] Fax: [phone]    Your procedure is scheduled on February 22  Report to [facility] at [place].  Call this number if you have problems the morning of surgery:  [phone]   Remember:  Do not eat food or drink liquids after midnight.  Continue all medications as directed by your physician except follow these medication instructions before surgery below   Take these medicines the morning of surgery with A SIP OF WATER  atenolol (TENORMIN) busPIRone (BUSPAR) clonazePAM (KLONOPIN) DULoxetine (CYMBALTA)  esomeprazole (NEXIUM) HYDROcodone-acetaminophen (NORCO/VICODIN) pregabalin (LYRICA) SYNTHROID  7 days prior to surgery STOP taking any Aspirin(unless otherwise instructed by your surgeon), Aleve, Naproxen, Ibuprofen, Motrin, Advil, Goody's, BC's, all herbal medications, fish oil, and all vitamins    Do not wear jewelry, make-up or nail polish.  Do not wear lotions, powders, or perfumes, or deodorant.  Do not shave 48 hours prior to surgery.    Do not bring valuables to the hospital.  [facility] is not responsible for any belongings or valuables.  Contacts, dentures or bridgework may not be worn into surgery.  Leave your suitcase in the car.  After surgery it may be brought to your room.  For patients admitted to the hospital, discharge time will be determined by your treatment team.  Patients discharged the day of surgery will not be allowed to drive home.    Special instructions:   [facility]- Preparing For Surgery  Before surgery, you can play an important role. Because  skin is not sterile, your skin needs to be as free of germs as possible. You can reduce the number of germs on your skin by washing with CHG (chlorahexidine gluconate) Soap before surgery.  CHG is an antiseptic cleaner which kills germs and bonds with the skin to continue killing germs even after washing.  Please do not use if you have an allergy to CHG or antibacterial soaps. If your skin becomes reddened/irritated stop using the CHG.  Do not shave (including legs and underarms) for at least 48 hours prior to first CHG shower. It is OK to shave your face.  Please follow these instructions carefully.   1. Shower the NIGHT BEFORE SURGERY and the MORNING OF SURGERY with CHG.   2. If you chose to wash your hair, wash your hair first as usual with your normal shampoo.  3. After you shampoo, rinse your hair and body thoroughly to remove the shampoo.  4. Use CHG as you would any other liquid soap. You can apply CHG directly to the skin and wash gently with a scrungie or a clean washcloth.   5. Apply the CHG Soap to your body ONLY FROM THE NECK DOWN.  Do not use on open wounds or open sores. Avoid contact with your eyes, ears, mouth and genitals (private parts). Wash Face and genitals (private parts)  with your normal soap.  6. Wash thoroughly, paying special attention to the area where your surgery will be performed.  7. Thoroughly rinse your body with warm water from the neck down.  8. DO NOT shower/wash with your  normal soap after using and rinsing off the CHG Soap.  9. Pat yourself dry with a CLEAN TOWEL.  10. Wear CLEAN PAJAMAS to bed the night before surgery, wear comfortable clothes the morning of surgery  11. Place CLEAN SHEETS on your bed the night of your first shower and DO NOT SLEEP WITH PETS.    Day of Surgery: Do not apply any deodorants/lotions. Please wear clean clothes to the hospital/surgery center.      Please read over the following fact sheets that you were  given.

## 2017-11-11 ENCOUNTER — Encounter (HOSPITAL_COMMUNITY)
Admission: RE | Admit: 2017-11-11 | Discharge: 2017-11-11 | Disposition: A | Discharge status: Home / Self Care | Payer: Medicare Other | Source: Ambulatory Visit | Attending: Orthopedic Surgery | Admitting: Orthopedic Surgery

## 2017-11-11 ENCOUNTER — Ambulatory Visit (HOSPITAL_COMMUNITY)
Admission: RE | Admit: 2017-11-11 | Discharge: 2017-11-11 | Disposition: A | Discharge status: Home / Self Care | Payer: Medicare Other | Source: Ambulatory Visit | Attending: Physician Assistant | Admitting: Physician Assistant

## 2017-11-11 ENCOUNTER — Other Ambulatory Visit: Payer: Self-pay

## 2017-11-11 ENCOUNTER — Encounter (HOSPITAL_COMMUNITY): Payer: Self-pay

## 2017-11-11 DIAGNOSIS — J9811 Atelectasis: Secondary | ICD-10-CM | POA: Insufficient documentation

## 2017-11-11 DIAGNOSIS — Z01812 Encounter for preprocedural laboratory examination: Secondary | ICD-10-CM | POA: Diagnosis not present

## 2017-11-11 DIAGNOSIS — M1711 Unilateral primary osteoarthritis, right knee: Secondary | ICD-10-CM | POA: Diagnosis not present

## 2017-11-11 DIAGNOSIS — Z01810 Encounter for preprocedural cardiovascular examination: Secondary | ICD-10-CM | POA: Diagnosis not present

## 2017-11-11 HISTORY — DX: Unspecified osteoarthritis, unspecified site: M19.90

## 2017-11-11 HISTORY — DX: Personal history of other medical treatment: Z92.89

## 2017-11-11 LAB — CBC WITH DIFFERENTIAL/PLATELET
BASOS PCT: 0 %
Basophils Absolute: 0 10*3/uL (ref 0.0–0.1)
Eosinophils Absolute: 0.2 10*3/uL (ref 0.0–0.7)
Eosinophils Relative: 2 %
HEMATOCRIT: 37 % (ref 36.0–46.0)
HEMOGLOBIN: 12.5 g/dL (ref 12.0–15.0)
LYMPHS ABS: 2.3 10*3/uL (ref 0.7–4.0)
Lymphocytes Relative: 27 %
MCH: 30.6 pg (ref 26.0–34.0)
MCHC: 33.8 g/dL (ref 30.0–36.0)
MCV: 90.5 fL (ref 78.0–100.0)
MONO ABS: 0.5 10*3/uL (ref 0.1–1.0)
MONOS PCT: 6 %
NEUTROS ABS: 5.7 10*3/uL (ref 1.7–7.7)
NEUTROS PCT: 65 %
Platelets: 320 10*3/uL (ref 150–400)
RBC: 4.09 MIL/uL (ref 3.87–5.11)
RDW: 14.4 % (ref 11.5–15.5)
WBC: 8.8 10*3/uL (ref 4.0–10.5)

## 2017-11-11 LAB — TYPE AND SCREEN
ABO/RH(D): A POS
ANTIBODY SCREEN: NEGATIVE

## 2017-11-11 LAB — ABO/RH: ABO/RH(D): A POS

## 2017-11-11 LAB — PROTIME-INR
INR: 0.97
PROTHROMBIN TIME: 12.8 s (ref 11.4–15.2)

## 2017-11-11 LAB — COMPREHENSIVE METABOLIC PANEL
ALK PHOS: 91 U/L (ref 38–126)
ALT: 24 U/L (ref 14–54)
ANION GAP: 13 (ref 5–15)
AST: 25 U/L (ref 15–41)
Albumin: 3.8 g/dL (ref 3.5–5.0)
BILIRUBIN TOTAL: 0.5 mg/dL (ref 0.3–1.2)
BUN: 15 mg/dL (ref 6–20)
CALCIUM: 9.6 mg/dL (ref 8.9–10.3)
CO2: 25 mmol/L (ref 22–32)
Chloride: 95 mmol/L — ABNORMAL LOW (ref 101–111)
Creatinine, Ser: 0.88 mg/dL (ref 0.44–1.00)
Glucose, Bld: 96 mg/dL (ref 65–99)
POTASSIUM: 4.3 mmol/L (ref 3.5–5.1)
Sodium: 133 mmol/L — ABNORMAL LOW (ref 135–145)
TOTAL PROTEIN: 7 g/dL (ref 6.5–8.1)

## 2017-11-11 LAB — APTT: aPTT: 30 seconds (ref 24–36)

## 2017-11-11 LAB — SURGICAL PCR SCREEN
MRSA, PCR: NEGATIVE
STAPHYLOCOCCUS AUREUS: NEGATIVE

## 2017-11-11 NOTE — Progress Notes (Signed)
Pt. Denies chest concerns, denies all flu like symptoms. Pt. Seen with chest pain several yrs. Ago & had stress test under the care of Dr. Ashok Norris, told that it was wnl & that she was having indigestion. Pt. Has appt. With [facility] for 2/14 for surgical clearance. Requesting records to include EKG from PCP- Dr. [name].Pt. Reports that she uses CPAP q night for almost 2 yrs. Now.

## 2017-11-13 ENCOUNTER — Encounter: Payer: Self-pay | Admitting: Podiatry

## 2017-11-13 ENCOUNTER — Ambulatory Visit (INDEPENDENT_AMBULATORY_CARE_PROVIDER_SITE_OTHER): Payer: Medicare Other | Admitting: Podiatry

## 2017-11-13 DIAGNOSIS — L600 Ingrowing nail: Secondary | ICD-10-CM | POA: Diagnosis not present

## 2017-11-13 MED ORDER — GENTAMICIN SULFATE 0.1 % EX CREA: 1.0000 "application " | TOPICAL_CREAM | Freq: Three times a day (TID) | CUTANEOUS | 1 refills | Status: DC

## 2017-11-13 NOTE — Progress Notes (Addendum)
Anesthesia Chart Review:  Pt is a 67 year old female scheduled for R total knee arthroplasty on 11/22/2017 with Earlie Server, MD  - PCP is [name], MD - Cardiologist was Fransico Him, MD. Pt cleared for surgery by Cecilie Kicks, NP 11/14/17; follow up prn recommended.   PMH includes:  HTN, hyperlipidemia, OSA, hypothyroidism, GERD. Never smoker. BMI 40  Medications include: atenolol, nexium, lisinopril-hctz, synthroid  BP (!) 112/54   Pulse 86   Temp 36.9 C   Resp 20   Ht 5\' 7"  (1.702 m)   Wt 256 lb 8 oz (116.3 kg)   SpO2 96%   BMI 40.17 kg/m   Preoperative labs reviewed.    CXR 11/11/17: Minimal left basilar atelectasis.  Lungs otherwise clear.  EKG 11/14/17: NSR.  Incomplete RBBB.  By notes, pt had a normal stress test 2013  If no changes, I anticipate pt can proceed with surgery as scheduled.   Willeen Cass, FNP-BC [facility]/Anesthesiology Phone: [phone] 11/15/2017 4:35 PM

## 2017-11-13 NOTE — Patient Instructions (Signed)

## 2017-11-14 ENCOUNTER — Encounter: Payer: Self-pay | Admitting: Cardiology

## 2017-11-14 ENCOUNTER — Ambulatory Visit (INDEPENDENT_AMBULATORY_CARE_PROVIDER_SITE_OTHER): Payer: Medicare Other | Admitting: Cardiology

## 2017-11-14 VITALS — BP 126/64 | HR 79 | Ht 67.0 in | Wt 258.0 lb

## 2017-11-14 DIAGNOSIS — Z01818 Encounter for other preprocedural examination: Secondary | ICD-10-CM

## 2017-11-14 DIAGNOSIS — I10 Essential (primary) hypertension: Secondary | ICD-10-CM | POA: Diagnosis not present

## 2017-11-14 DIAGNOSIS — R9431 Abnormal electrocardiogram [ECG] [EKG]: Secondary | ICD-10-CM

## 2017-11-14 NOTE — Progress Notes (Signed)
.   Cardiology Office Note  NEW PATIENT VISIT  Date:  11/14/2017   ID:  CIGI BEGA, DOB [birth year], MRN [id number]  PCP:  [name], MD  Cardiologist:  New  Dr. Radford Pax    Chief Complaint  Patient presents with  . Pre-op Exam      History of Present Illness: Olivia [name] is a 67 y.o. female who is being seen today for the evaluation of pre-op clearance for Rt total knee replacement scheduled for 2.22.19 at the request of Dr. [name] and Dr. Earlie Server. She was seen by Dr. Tamala Julian and her EKG had mild changes and pt has had stress test with Dr. Radford Pax in 2013.  With exercise capacity of 7 METS normal myocardial perfusion study.  No ischemia.          Today she has no chest pain, no SOB, no palpitations.  She is able to walk up 2 flights of steps without chest pain,  Some SOB.  She has done done regular exercise in 2 years.      Past Medical History:  Diagnosis Date  . Anxiety    [facility]  . Arthritis   . Cataract    THESE BEEN REPLACED  . Depression    [facility]  . Diverticulosis   . Dry eye syndrome   . GERD (gastroesophageal reflux disease)   . History of colon polyps 12/03/2012   COLONOSCOPY  . History of exercise stress test 2010   Dr. Ashok Norris   . Hyperlipidemia   . Hypertension   . Hypothyroidism   . Multinodular goiter    DR. KERR  . OSA (obstructive sleep apnea)    (PSG 12/12/15 ESS 2, AHI 42/HR REM 30/HR, O2 MIN 80%)  CPAP- q night , done with [facility].  study done at [facility]  . Squamous cell carcinoma in situ 2015  . Thyroid disease    Nodules  . Tubular adenoma    DR. [place]  . Varicose veins     Past Surgical History:  Procedure Laterality Date  . BREAST EXCISIONAL BIOPSY Left    benign  . CHOLECYSTECTOMY  2003   Gall Bladder  . COLONOSCOPY  06/25/2017   DR. [place] Right    Cataract  . EYE SURGERY Left    Cataract  . fibroid adenoma  1989 and [place]  . SEPTOPLASTY     for  deviated septum     Current Outpatient Medications  Medication Sig Dispense Refill  . atenolol (TENORMIN) 25 MG tablet Take 25 mg by mouth daily before breakfast.     . busPIRone (BUSPAR) 30 MG tablet Take 30 mg by mouth 2 (two) times daily.     . calcium-vitamin D (OSCAL WITH D) 500-200 MG-UNIT per tablet Take 1 tablet by mouth daily.    . clonazePAM (KLONOPIN) 1 MG tablet Take 0.5-1 mg by mouth See admin instructions. 1 mg 3 times daily , and 0.5 mg at 5:00p    . DULoxetine (CYMBALTA) 60 MG capsule Take 120 mg by mouth daily.     Marland Kitchen esomeprazole (NEXIUM) 40 MG capsule Take 40 mg by mouth 2 (two) times daily before a meal.     . gentamicin cream (GARAMYCIN) 0.1 % Apply 1 application topically 3 (three) times daily. 30 g 1  . HYDROcodone-acetaminophen (NORCO/VICODIN) 5-325 MG tablet Take 1 tablet by mouth every 4 (four) hours as needed for moderate pain.     Marland Kitchen  lisinopril-hydrochlorothiazide (PRINZIDE,ZESTORETIC) 20-25 MG per tablet Take 1 tablet by mouth daily.     . methocarbamol (ROBAXIN) 500 MG tablet Take 500 mg by mouth 4 (four) times daily as needed. spasms    . mirtazapine (REMERON) 30 MG tablet Take 30 mg by mouth at bedtime.     . Multiple Vitamin (MULITIVITAMIN WITH MINERALS) TABS Take 1 tablet by mouth daily.    . pregabalin (LYRICA) 75 MG capsule Take 1 capsule (75 mg total) by mouth 2 (two) times daily. 60 capsule 5  . SYNTHROID 125 MCG tablet Take 125 mcg by mouth daily before breakfast.     . vitamin C (ASCORBIC ACID) 500 MG tablet Take 500 mg by mouth daily.     No current facility-administered medications for this visit.     Allergies:   Demerol [meperidine] and Penicillins    Social History:  The patient  reports that  has never smoked. she has never used smokeless tobacco. She reports that she does not drink alcohol or use drugs.   Family History:  The patient's family history includes Dementia (age of onset: 9) in her mother; Emphysema in her father and maternal  grandfather; Healthy in her son and son; Hypertension in her father.    ROS:  General:no colds or fevers, no weight changes Skin:no rashes or ulcers HEENT:no blurred vision, no congestion CV:see HPI PUL:see HPI GI:no diarrhea constipation or melena, no indigestion GU:no hematuria, no dysuria MS:+ rt knee pain, no claudication Neuro:no syncope, no lightheadedness Endo:no diabetes, + thyroid disease  Wt Readings from Last 3 Encounters:  11/14/17 258 lb (117 kg)  11/11/17 256 lb 8 oz (116.3 kg)  02/26/17 245 lb 2 oz (111.2 kg)     PHYSICAL EXAM: VS:  BP 126/64   Pulse 79   Ht 5\' 7"  (1.702 m)   Wt 258 lb (117 kg)   BMI 40.41 kg/m  , BMI Body mass index is 40.41 kg/m. General:Pleasant affect, NAD Skin:Warm and dry, brisk capillary refill HEENT:normocephalic, sclera clear, mucus membranes moist Neck:supple, no JVD, no bruits  Heart:S1S2 RRR without murmur, gallup, rub or click Lungs:clear without rales, rhonchi, or wheezes ZHG:DJME, non tender, + BS, do not palpate liver spleen or masses Ext:no lower ext edema, 2+ pedal pulses, 2+ radial pulses Neuro:alert and oriented X 3, MAE, follows commands, + facial symmetry    EKG:  EKG is ordered today. The ekg ordered today demonstrates SR with incomplete Rt BBB    Recent Labs: 03/12/2017: TSH 1.87 11/11/2017: ALT 24; BUN 15; Creatinine, Ser 0.88; Hemoglobin 12.5; Platelets 320; Potassium 4.3; Sodium 133    Lipid Panel No results found for: CHOL, TRIG, HDL, CHOLHDL, VLDL, LDLCALC, LDLDIRECT     Other studies Reviewed: Additional studies/ records that were reviewed today include:. nuc study in 2013 with no ischemia   ASSESSMENT AND PLAN:  1.  Abnormal EKG - today's EKG is normal. Reviewed with Dr. Radford Pax.  She can ambulate 4 METS without chest pain.   2.  Pre-op exam  Low risk surgery and she is at low risk.  Discussed with Dr. Radford Pax.    3.  HTN stable. And controlled. On tenormin and lisinopril  She will follow up  as needed.    Current medicines are reviewed with the patient today.  The patient Has no concerns regarding medicines.  The following changes have been made:  See above Labs/ tests ordered today include:see above  Disposition:   FU:  see above  Signed, Mickel Baas  Dorene Ar, NP  11/14/2017 3:39 PM    [facility] [place], [place], [place] Phone: [phone]; Fax: [phone]

## 2017-11-14 NOTE — Addendum Note (Signed)
Addended by: Isaiah Serge on: 11/14/2017 04:02 PM   Modules accepted: Level of Service

## 2017-11-14 NOTE — Patient Instructions (Signed)
Medication Instructions:  Your physician recommends that you continue on your current medications as directed. Please refer to the Current Medication list given to you today.   Labwork: None ordered  Testing/Procedures: None ordered  Follow-Up: Your physician recommends that you schedule a follow-up appointment in: AS NEEDED   Any Other Special Instructions Will Be Listed Below (If Applicable).     If you need a refill on your cardiac medications before your next appointment, please call your pharmacy.   

## 2017-11-15 NOTE — Addendum Note (Signed)
Addended by: Marlis Edelson C on: 11/15/2017 12:03 PM   Modules accepted: Orders

## 2017-11-17 NOTE — Progress Notes (Signed)
   Subjective: Patient presents today for evaluation of pain to the medial border of the right great toe that began gradually 3-4 months ago. She reports associated erythema and swelling. Patient is concerned for possible ingrown nail. Applying pressure to the area increases the pain. She has been trimming the nail out herself with no significant relief. Patient presents today for further treatment and evaluation.   Past Medical History:  Diagnosis Date  . Anxiety    [facility]  . Arthritis   . Cataract    THESE BEEN REPLACED  . Depression    [facility]  . Diverticulosis   . Dry eye syndrome   . GERD (gastroesophageal reflux disease)   . History of colon polyps 12/03/2012   COLONOSCOPY  . History of exercise stress test 2010   Dr. Ashok Norris   . Hyperlipidemia   . Hypertension   . Hypothyroidism   . Multinodular goiter    DR. KERR  . OSA (obstructive sleep apnea)    (PSG 12/12/15 ESS 2, AHI 42/HR REM 30/HR, O2 MIN 80%)  CPAP- q night , done with [facility].  study done at [facility]  . Squamous cell carcinoma in situ 2015  . Thyroid disease    Nodules  . Tubular adenoma    DR. [place]  . Varicose veins     Objective:  General: Well developed, nourished, in no acute distress, alert and oriented x3   Dermatology: Skin is warm, dry and supple bilateral. Medial border of the right great toe appears to be erythematous with evidence of an ingrowing nail. Pain on palpation noted to the border of the nail fold. The remaining nails appear unremarkable at this time. There are no open sores, lesions.  Vascular: Dorsalis Pedis artery and Posterior Tibial artery pedal pulses palpable. No lower extremity edema noted.   Neruologic: Grossly intact via light touch bilateral.  Musculoskeletal: Muscular strength within normal limits in all groups bilateral. Normal range of motion noted to all pedal and ankle joints.   Assesement: #1 Paronychia with ingrowing nail  medial border right great toe #2 Pain in toe #3 Incurvated nail  Plan of Care:  1. Patient evaluated.  2. Discussed treatment alternatives and plan of care. Explained nail avulsion procedure and post procedure course to patient. 3. Patient opted for permanent partial nail avulsion.  4. Prior to procedure, local anesthesia infiltration utilized using 3 ml of a 50:50 mixture of 2% plain lidocaine and 0.5% plain marcaine in a normal hallux block fashion and a betadine prep performed.  5. Partial permanent nail avulsion with chemical matrixectomy performed using [id number] applications of phenol followed by alcohol flush.  6. Light dressing applied. 7. Prescription for gentamicin cream provided to patient.  8. Return to clinic as needed.   Edrick Kins, DPM [facility]  Dr. Edrick Kins, [place]                                        [place], [place] [address]                Office [phone]  Fax [phone]

## 2017-11-18 ENCOUNTER — Other Ambulatory Visit: Payer: Self-pay | Admitting: Orthopedic Surgery

## 2017-11-21 ENCOUNTER — Encounter (HOSPITAL_COMMUNITY): Payer: Self-pay | Admitting: Anesthesiology

## 2017-11-21 MED ORDER — TRANEXAMIC ACID 1000 MG/10ML IV SOLN
1000.0000 mg | INTRAVENOUS | Status: DC
  Filled 2017-11-21: qty 10

## 2017-11-21 MED ORDER — VANCOMYCIN HCL 10 G IV SOLR
1500.0000 mg | INTRAVENOUS | Status: AC
  Administered 2017-11-22: 1500 mg via INTRAVENOUS
  Filled 2017-11-21: qty 1500

## 2017-11-21 MED ORDER — TRANEXAMIC ACID 1000 MG/10ML IV SOLN
2000.0000 mg | INTRAVENOUS | Status: DC
  Filled 2017-11-21: qty 20

## 2017-11-21 MED ORDER — BUPIVACAINE LIPOSOME 1.3 % IJ SUSP
20.0000 mL | Freq: Once | INTRAMUSCULAR | Status: DC
  Filled 2017-11-21 (×2): qty 20

## 2017-11-21 NOTE — Anesthesia Preprocedure Evaluation (Addendum)
Anesthesia Evaluation  Patient identified by MRN, date of birth, ID band Patient awake    Reviewed: Allergy & Precautions, NPO status , Patient's Chart, lab work & pertinent test results, reviewed documented beta blocker date and time   Airway Mallampati: II       Dental no notable dental hx. (+) Teeth Intact   Pulmonary    Pulmonary exam normal breath sounds clear to auscultation       Cardiovascular hypertension, Pt. on medications and Pt. on home beta blockers Normal cardiovascular exam Rhythm:Regular Rate:Normal     Neuro/Psych    GI/Hepatic GERD  Medicated and Controlled,  Endo/Other  Hypothyroidism Morbid obesity  Renal/GU      Musculoskeletal   Abdominal (+) + obese,   Peds  Hematology   Anesthesia Other Findings   Reproductive/Obstetrics                            Anesthesia Physical Anesthesia Plan  ASA: III  Anesthesia Plan: Spinal   Post-op Pain Management:  Regional for Post-op pain   Induction:   PONV Risk Score and Plan: 2  Airway Management Planned: Natural Airway, Nasal Cannula and Simple Face Mask  Additional Equipment:   Intra-op Plan:   Post-operative Plan:   Informed Consent: I have reviewed the patients History and Physical, chart, labs and discussed the procedure including the risks, benefits and alternatives for the proposed anesthesia with the patient or authorized representative who has indicated his/her understanding and acceptance.     Plan Discussed with: CRNA and Surgeon  Anesthesia Plan Comments:        Anesthesia Quick Evaluation

## 2017-11-22 ENCOUNTER — Encounter (HOSPITAL_COMMUNITY)
Admission: RE | Disposition: A | Discharge status: Skilled Nursing Facility | Payer: Self-pay | Source: Ambulatory Visit | Attending: Orthopedic Surgery

## 2017-11-22 ENCOUNTER — Other Ambulatory Visit: Payer: Self-pay

## 2017-11-22 ENCOUNTER — Encounter (HOSPITAL_COMMUNITY): Payer: Self-pay

## 2017-11-22 ENCOUNTER — Inpatient Hospital Stay (HOSPITAL_COMMUNITY): Payer: Medicare Other | Admitting: Certified Registered Nurse Anesthetist

## 2017-11-22 ENCOUNTER — Inpatient Hospital Stay (HOSPITAL_COMMUNITY): Payer: Medicare Other | Admitting: Emergency Medicine

## 2017-11-22 ENCOUNTER — Inpatient Hospital Stay (HOSPITAL_COMMUNITY)
Admission: RE | Admit: 2017-11-22 | Discharge: 2017-11-25 | DRG: 470 | Disposition: A | Discharge status: Skilled Nursing Facility | Payer: Medicare Other | Source: Ambulatory Visit | Attending: Orthopedic Surgery | Admitting: Orthopedic Surgery

## 2017-11-22 DIAGNOSIS — Z23 Encounter for immunization: Secondary | ICD-10-CM | POA: Diagnosis not present

## 2017-11-22 DIAGNOSIS — Z8249 Family history of ischemic heart disease and other diseases of the circulatory system: Secondary | ICD-10-CM

## 2017-11-22 DIAGNOSIS — Z471 Aftercare following joint replacement surgery: Secondary | ICD-10-CM | POA: Diagnosis not present

## 2017-11-22 DIAGNOSIS — K219 Gastro-esophageal reflux disease without esophagitis: Secondary | ICD-10-CM | POA: Diagnosis not present

## 2017-11-22 DIAGNOSIS — Z79890 Hormone replacement therapy (postmenopausal): Secondary | ICD-10-CM

## 2017-11-22 DIAGNOSIS — E039 Hypothyroidism, unspecified: Secondary | ICD-10-CM | POA: Diagnosis present

## 2017-11-22 DIAGNOSIS — M25761 Osteophyte, right knee: Secondary | ICD-10-CM | POA: Diagnosis not present

## 2017-11-22 DIAGNOSIS — Z885 Allergy status to narcotic agent status: Secondary | ICD-10-CM | POA: Diagnosis not present

## 2017-11-22 DIAGNOSIS — E871 Hypo-osmolality and hyponatremia: Secondary | ICD-10-CM | POA: Diagnosis not present

## 2017-11-22 DIAGNOSIS — Z818 Family history of other mental and behavioral disorders: Secondary | ICD-10-CM

## 2017-11-22 DIAGNOSIS — Z9842 Cataract extraction status, left eye: Secondary | ICD-10-CM | POA: Diagnosis not present

## 2017-11-22 DIAGNOSIS — Z79899 Other long term (current) drug therapy: Secondary | ICD-10-CM

## 2017-11-22 DIAGNOSIS — G4733 Obstructive sleep apnea (adult) (pediatric): Secondary | ICD-10-CM | POA: Diagnosis present

## 2017-11-22 DIAGNOSIS — F418 Other specified anxiety disorders: Secondary | ICD-10-CM | POA: Diagnosis not present

## 2017-11-22 DIAGNOSIS — G8918 Other acute postprocedural pain: Secondary | ICD-10-CM | POA: Diagnosis not present

## 2017-11-22 DIAGNOSIS — M1711 Unilateral primary osteoarthritis, right knee: Secondary | ICD-10-CM | POA: Diagnosis not present

## 2017-11-22 DIAGNOSIS — H04129 Dry eye syndrome of unspecified lacrimal gland: Secondary | ICD-10-CM | POA: Diagnosis not present

## 2017-11-22 DIAGNOSIS — I10 Essential (primary) hypertension: Secondary | ICD-10-CM | POA: Diagnosis present

## 2017-11-22 DIAGNOSIS — Z9841 Cataract extraction status, right eye: Secondary | ICD-10-CM | POA: Diagnosis not present

## 2017-11-22 DIAGNOSIS — S8002XA Contusion of left knee, initial encounter: Secondary | ICD-10-CM | POA: Diagnosis not present

## 2017-11-22 DIAGNOSIS — F329 Major depressive disorder, single episode, unspecified: Secondary | ICD-10-CM | POA: Diagnosis present

## 2017-11-22 DIAGNOSIS — F32A Depression, unspecified: Secondary | ICD-10-CM | POA: Diagnosis present

## 2017-11-22 DIAGNOSIS — F419 Anxiety disorder, unspecified: Secondary | ICD-10-CM | POA: Diagnosis present

## 2017-11-22 DIAGNOSIS — Z96651 Presence of right artificial knee joint: Secondary | ICD-10-CM | POA: Diagnosis not present

## 2017-11-22 DIAGNOSIS — M1990 Unspecified osteoarthritis, unspecified site: Secondary | ICD-10-CM | POA: Diagnosis not present

## 2017-11-22 DIAGNOSIS — K5790 Diverticulosis of intestine, part unspecified, without perforation or abscess without bleeding: Secondary | ICD-10-CM | POA: Diagnosis not present

## 2017-11-22 DIAGNOSIS — E785 Hyperlipidemia, unspecified: Secondary | ICD-10-CM | POA: Diagnosis present

## 2017-11-22 DIAGNOSIS — Z880 Allergy status to penicillin: Secondary | ICD-10-CM

## 2017-11-22 DIAGNOSIS — Z6841 Body Mass Index (BMI) 40.0 and over, adult: Secondary | ICD-10-CM

## 2017-11-22 DIAGNOSIS — M79604 Pain in right leg: Secondary | ICD-10-CM | POA: Diagnosis not present

## 2017-11-22 DIAGNOSIS — D62 Acute posthemorrhagic anemia: Secondary | ICD-10-CM | POA: Diagnosis not present

## 2017-11-22 DIAGNOSIS — Z9989 Dependence on other enabling machines and devices: Secondary | ICD-10-CM

## 2017-11-22 DIAGNOSIS — Z9049 Acquired absence of other specified parts of digestive tract: Secondary | ICD-10-CM

## 2017-11-22 DIAGNOSIS — M179 Osteoarthritis of knee, unspecified: Secondary | ICD-10-CM | POA: Diagnosis not present

## 2017-11-22 DIAGNOSIS — Z86010 Personal history of colonic polyps: Secondary | ICD-10-CM | POA: Diagnosis not present

## 2017-11-22 DIAGNOSIS — G8911 Acute pain due to trauma: Secondary | ICD-10-CM | POA: Diagnosis not present

## 2017-11-22 HISTORY — PX: TOTAL KNEE ARTHROPLASTY: SHX125

## 2017-11-22 SURGERY — ARTHROPLASTY, KNEE, TOTAL
Anesthesia: Spinal | Site: Knee | Laterality: Right

## 2017-11-22 MED ORDER — DEXTROSE 5 % IV SOLN
INTRAVENOUS | Status: DC | PRN
  Administered 2017-11-22: 25 ug/min via INTRAVENOUS

## 2017-11-22 MED ORDER — BUPIVACAINE-EPINEPHRINE (PF) 0.5% -1:200000 IJ SOLN
INTRAMUSCULAR | Status: AC
  Filled 2017-11-22: qty 30

## 2017-11-22 MED ORDER — HYDROCHLOROTHIAZIDE 25 MG PO TABS
25.0000 mg | ORAL_TABLET | Freq: Every day | ORAL | Status: DC
  Administered 2017-11-22 – 2017-11-23 (×2): 25 mg via ORAL
  Filled 2017-11-22 (×2): qty 1

## 2017-11-22 MED ORDER — SODIUM CHLORIDE 0.9 % IV SOLN
INTRAVENOUS | Status: DC
  Administered 2017-11-22: 15:00:00 via INTRAVENOUS

## 2017-11-22 MED ORDER — METHOCARBAMOL 500 MG PO TABS
500.0000 mg | ORAL_TABLET | Freq: Four times a day (QID) | ORAL | Status: DC | PRN
  Administered 2017-11-22 – 2017-11-25 (×8): 500 mg via ORAL
  Filled 2017-11-22 (×9): qty 1

## 2017-11-22 MED ORDER — PREGABALIN 75 MG PO CAPS
75.0000 mg | ORAL_CAPSULE | Freq: Two times a day (BID) | ORAL | Status: DC
  Administered 2017-11-22 – 2017-11-25 (×6): 75 mg via ORAL
  Filled 2017-11-22 (×6): qty 1

## 2017-11-22 MED ORDER — METOCLOPRAMIDE HCL 5 MG/ML IJ SOLN
5.0000 mg | Freq: Three times a day (TID) | INTRAMUSCULAR | Status: DC | PRN
  Administered 2017-11-22: 10 mg via INTRAVENOUS
  Administered 2017-11-23: 5 mg via INTRAVENOUS
  Filled 2017-11-22 (×2): qty 2

## 2017-11-22 MED ORDER — CLONAZEPAM 0.5 MG PO TABS: 0.5000 mg | ORAL_TABLET | ORAL | Status: DC

## 2017-11-22 MED ORDER — SODIUM CHLORIDE 0.9 % IV SOLN
1000.0000 mg | INTRAVENOUS | Status: AC
  Administered 2017-11-22: 1000 mg via INTRAVENOUS
  Filled 2017-11-22: qty 10

## 2017-11-22 MED ORDER — MENTHOL 3 MG MT LOZG: 1.0000 | LOZENGE | OROMUCOSAL | Status: DC | PRN

## 2017-11-22 MED ORDER — PROPOFOL 500 MG/50ML IV EMUL
INTRAVENOUS | Status: DC | PRN
  Administered 2017-11-22: 75 ug/kg/min via INTRAVENOUS

## 2017-11-22 MED ORDER — MIDAZOLAM HCL 2 MG/2ML IJ SOLN
INTRAMUSCULAR | Status: AC
  Filled 2017-11-22: qty 2

## 2017-11-22 MED ORDER — OXYCODONE HCL 5 MG PO TABS
10.0000 mg | ORAL_TABLET | ORAL | Status: DC | PRN
  Administered 2017-11-22 – 2017-11-25 (×18): 10 mg via ORAL
  Filled 2017-11-22 (×18): qty 2

## 2017-11-22 MED ORDER — FLEET ENEMA 7-19 GM/118ML RE ENEM: 1.0000 | ENEMA | Freq: Once | RECTAL | Status: DC | PRN

## 2017-11-22 MED ORDER — PHENOL 1.4 % MT LIQD: 1.0000 | OROMUCOSAL | Status: DC | PRN

## 2017-11-22 MED ORDER — DOCUSATE SODIUM 100 MG PO CAPS
100.0000 mg | ORAL_CAPSULE | Freq: Two times a day (BID) | ORAL | Status: DC
Start: 2017-11-22 — End: 2017-11-25
  Administered 2017-11-22 – 2017-11-25 (×7): 100 mg via ORAL
  Filled 2017-11-22 (×7): qty 1

## 2017-11-22 MED ORDER — FENTANYL CITRATE (PF) 250 MCG/5ML IJ SOLN
INTRAMUSCULAR | Status: AC
  Filled 2017-11-22: qty 5

## 2017-11-22 MED ORDER — ATENOLOL 50 MG PO TABS
25.0000 mg | ORAL_TABLET | Freq: Every day | ORAL | Status: DC
  Administered 2017-11-23 – 2017-11-25 (×3): 25 mg via ORAL
  Filled 2017-11-22 (×3): qty 1

## 2017-11-22 MED ORDER — LACTATED RINGERS IV SOLN
INTRAVENOUS | Status: DC
  Administered 2017-11-22: 06:00:00 via INTRAVENOUS

## 2017-11-22 MED ORDER — CLONAZEPAM 1 MG PO TABS
1.0000 mg | ORAL_TABLET | Freq: Three times a day (TID) | ORAL | Status: DC
  Administered 2017-11-22 – 2017-11-25 (×9): 1 mg via ORAL
  Filled 2017-11-22 (×9): qty 1

## 2017-11-22 MED ORDER — APIXABAN 2.5 MG PO TABS
2.5000 mg | ORAL_TABLET | Freq: Two times a day (BID) | ORAL | Status: DC
  Administered 2017-11-23 – 2017-11-25 (×5): 2.5 mg via ORAL
  Filled 2017-11-22 (×5): qty 1

## 2017-11-22 MED ORDER — DULOXETINE HCL 60 MG PO CPEP
120.0000 mg | ORAL_CAPSULE | Freq: Every day | ORAL | Status: DC
Start: 2017-11-23 — End: 2017-11-25
  Administered 2017-11-23 – 2017-11-25 (×3): 120 mg via ORAL
  Filled 2017-11-22 (×3): qty 2

## 2017-11-22 MED ORDER — CLONAZEPAM 0.5 MG PO TABS
0.5000 mg | ORAL_TABLET | ORAL | Status: DC
  Administered 2017-11-22 – 2017-11-25 (×4): 0.5 mg via ORAL
  Filled 2017-11-22 (×5): qty 1

## 2017-11-22 MED ORDER — TRANEXAMIC ACID 1000 MG/10ML IV SOLN
1000.0000 mg | Freq: Once | INTRAVENOUS | Status: AC
  Administered 2017-11-22: 1000 mg via INTRAVENOUS
  Filled 2017-11-22: qty 10

## 2017-11-22 MED ORDER — SODIUM CHLORIDE 0.9 % IV SOLN
2000.0000 mg | Freq: Once | INTRAVENOUS | Status: DC
  Filled 2017-11-22: qty 20

## 2017-11-22 MED ORDER — MIRTAZAPINE 15 MG PO TABS
30.0000 mg | ORAL_TABLET | Freq: Every day | ORAL | Status: DC
  Administered 2017-11-22 – 2017-11-24 (×3): 30 mg via ORAL
  Filled 2017-11-22 (×3): qty 2

## 2017-11-22 MED ORDER — 0.9 % SODIUM CHLORIDE (POUR BTL) OPTIME
TOPICAL | Status: DC | PRN
  Administered 2017-11-22: 1000 mL

## 2017-11-22 MED ORDER — LEVOTHYROXINE SODIUM 25 MCG PO TABS
125.0000 ug | ORAL_TABLET | Freq: Every day | ORAL | Status: DC
  Administered 2017-11-24 – 2017-11-25 (×2): 125 ug via ORAL
  Filled 2017-11-22 (×2): qty 1

## 2017-11-22 MED ORDER — BUSPIRONE HCL 10 MG PO TABS
30.0000 mg | ORAL_TABLET | Freq: Two times a day (BID) | ORAL | Status: DC
  Administered 2017-11-22 – 2017-11-25 (×5): 30 mg via ORAL
  Filled 2017-11-22 (×2): qty 3
  Filled 2017-11-22: qty 6
  Filled 2017-11-22 (×2): qty 3

## 2017-11-22 MED ORDER — FENTANYL CITRATE (PF) 100 MCG/2ML IJ SOLN
INTRAMUSCULAR | Status: DC | PRN
  Administered 2017-11-22 (×2): 50 ug via INTRAVENOUS

## 2017-11-22 MED ORDER — CHLORHEXIDINE GLUCONATE 4 % EX LIQD: 60.0000 mL | Freq: Once | CUTANEOUS | Status: DC

## 2017-11-22 MED ORDER — OXYCODONE HCL 5 MG PO TABS
5.0000 mg | ORAL_TABLET | ORAL | Status: DC | PRN
  Administered 2017-11-22: 5 mg via ORAL
  Filled 2017-11-22: qty 1

## 2017-11-22 MED ORDER — LISINOPRIL-HYDROCHLOROTHIAZIDE 20-25 MG PO TABS: 1.0000 | ORAL_TABLET | Freq: Every day | ORAL | Status: DC

## 2017-11-22 MED ORDER — METOCLOPRAMIDE HCL 5 MG PO TABS: 5.0000 mg | ORAL_TABLET | Freq: Three times a day (TID) | ORAL | Status: DC | PRN

## 2017-11-22 MED ORDER — APIXABAN 2.5 MG PO TABS: 2.5000 mg | ORAL_TABLET | Freq: Two times a day (BID) | ORAL | 0 refills | Status: DC

## 2017-11-22 MED ORDER — ACETAMINOPHEN 325 MG PO TABS
650.0000 mg | ORAL_TABLET | ORAL | Status: DC | PRN
  Administered 2017-11-22 (×2): 650 mg via ORAL
  Filled 2017-11-22 (×3): qty 2

## 2017-11-22 MED ORDER — PANTOPRAZOLE SODIUM 40 MG PO TBEC
80.0000 mg | DELAYED_RELEASE_TABLET | Freq: Every day | ORAL | Status: DC
  Administered 2017-11-23 – 2017-11-25 (×4): 80 mg via ORAL
  Filled 2017-11-22 (×4): qty 2

## 2017-11-22 MED ORDER — VITAMIN C 500 MG PO TABS
500.0000 mg | ORAL_TABLET | Freq: Every day | ORAL | Status: DC
  Administered 2017-11-22 – 2017-11-25 (×4): 500 mg via ORAL
  Filled 2017-11-22 (×4): qty 1

## 2017-11-22 MED ORDER — ACETAMINOPHEN 650 MG RE SUPP: 650.0000 mg | RECTAL | Status: DC | PRN

## 2017-11-22 MED ORDER — LISINOPRIL 20 MG PO TABS
20.0000 mg | ORAL_TABLET | Freq: Every day | ORAL | Status: DC
  Administered 2017-11-22 – 2017-11-23 (×2): 20 mg via ORAL
  Filled 2017-11-22 (×2): qty 1

## 2017-11-22 MED ORDER — EPHEDRINE 5 MG/ML INJ
INTRAVENOUS | Status: AC
  Filled 2017-11-22: qty 10

## 2017-11-22 MED ORDER — HYDROMORPHONE HCL 1 MG/ML IJ SOLN
1.0000 mg | INTRAMUSCULAR | Status: DC | PRN
Start: 2017-11-22 — End: 2017-11-23
  Administered 2017-11-22 – 2017-11-23 (×7): 1 mg via INTRAVENOUS
  Filled 2017-11-22 (×7): qty 1

## 2017-11-22 MED ORDER — MEPERIDINE HCL 50 MG/ML IJ SOLN
6.2500 mg | INTRAMUSCULAR | Status: DC | PRN
Start: 2017-11-22 — End: 2017-11-22

## 2017-11-22 MED ORDER — SODIUM CHLORIDE 0.9 % IV SOLN: INTRAVENOUS | Status: DC

## 2017-11-22 MED ORDER — SODIUM CHLORIDE 0.9 % IR SOLN
Status: DC | PRN
  Administered 2017-11-22: 1

## 2017-11-22 MED ORDER — BUPIVACAINE HCL (PF) 0.25 % IJ SOLN
INTRAMUSCULAR | Status: AC
Start: 2017-11-22 — End: 2017-11-22
  Filled 2017-11-22: qty 30

## 2017-11-22 MED ORDER — ONDANSETRON HCL 4 MG PO TABS
4.0000 mg | ORAL_TABLET | Freq: Four times a day (QID) | ORAL | Status: DC | PRN
  Administered 2017-11-25: 4 mg via ORAL
  Filled 2017-11-22: qty 1

## 2017-11-22 MED ORDER — SODIUM CHLORIDE 0.9 % IV SOLN
INTRAVENOUS | Status: AC | PRN
  Administered 2017-11-22: 2000 mg via TOPICAL

## 2017-11-22 MED ORDER — BUPIVACAINE-EPINEPHRINE 0.5% -1:200000 IJ SOLN
INTRAMUSCULAR | Status: DC | PRN
  Administered 2017-11-22: 30 mL

## 2017-11-22 MED ORDER — BUPIVACAINE LIPOSOME 1.3 % IJ SUSP
INTRAMUSCULAR | Status: DC | PRN
  Administered 2017-11-22: 10 mL

## 2017-11-22 MED ORDER — OXYCODONE HCL 5 MG PO TABS: ORAL_TABLET | ORAL | 0 refills | Status: DC

## 2017-11-22 MED ORDER — PROMETHAZINE HCL 25 MG/ML IJ SOLN
6.2500 mg | INTRAMUSCULAR | Status: DC | PRN
Start: 2017-11-22 — End: 2017-11-22

## 2017-11-22 MED ORDER — VANCOMYCIN HCL IN DEXTROSE 1-5 GM/200ML-% IV SOLN
1000.0000 mg | Freq: Two times a day (BID) | INTRAVENOUS | Status: AC
  Administered 2017-11-22: 1000 mg via INTRAVENOUS
  Filled 2017-11-22: qty 200

## 2017-11-22 MED ORDER — SORBITOL 70 % SOLN: 30.0000 mL | Freq: Every day | Status: DC | PRN

## 2017-11-22 MED ORDER — PROPOFOL 10 MG/ML IV BOLUS
INTRAVENOUS | Status: AC
  Filled 2017-11-22: qty 20

## 2017-11-22 MED ORDER — SENNOSIDES-DOCUSATE SODIUM 8.6-50 MG PO TABS
1.0000 | ORAL_TABLET | Freq: Every evening | ORAL | Status: DC | PRN
  Administered 2017-11-24: 1 via ORAL
  Filled 2017-11-22: qty 1

## 2017-11-22 MED ORDER — HYDROMORPHONE HCL 1 MG/ML IJ SOLN: 0.2500 mg | INTRAMUSCULAR | Status: DC | PRN

## 2017-11-22 MED ORDER — MIDAZOLAM HCL 5 MG/5ML IJ SOLN
INTRAMUSCULAR | Status: DC | PRN
  Administered 2017-11-22: 2 mg via INTRAVENOUS

## 2017-11-22 MED ORDER — ROPIVACAINE HCL 7.5 MG/ML IJ SOLN
INTRAMUSCULAR | Status: DC | PRN
  Administered 2017-11-22 (×4): 5 mL via PERINEURAL

## 2017-11-22 MED ORDER — CALCIUM CARBONATE-VITAMIN D 500-200 MG-UNIT PO TABS
1.0000 | ORAL_TABLET | Freq: Every day | ORAL | Status: DC
  Administered 2017-11-22 – 2017-11-25 (×4): 1 via ORAL
  Filled 2017-11-22 (×4): qty 1

## 2017-11-22 MED ORDER — KETOROLAC TROMETHAMINE 30 MG/ML IJ SOLN: 30.0000 mg | Freq: Once | INTRAMUSCULAR | Status: DC | PRN

## 2017-11-22 MED ORDER — SODIUM CHLORIDE 0.9% FLUSH
INTRAVENOUS | Status: DC | PRN
  Administered 2017-11-22: 25 mL

## 2017-11-22 MED ORDER — EPHEDRINE SULFATE-NACL 50-0.9 MG/10ML-% IV SOSY
PREFILLED_SYRINGE | INTRAVENOUS | Status: DC | PRN
  Administered 2017-11-22: 10 mg via INTRAVENOUS
  Administered 2017-11-22: 5 mg via INTRAVENOUS

## 2017-11-22 MED ORDER — ADULT MULTIVITAMIN W/MINERALS CH
1.0000 | ORAL_TABLET | Freq: Every day | ORAL | Status: DC
  Administered 2017-11-22 – 2017-11-25 (×4): 1 via ORAL
  Filled 2017-11-22 (×4): qty 1

## 2017-11-22 MED ORDER — ROPIVACAINE HCL 5 MG/ML IJ SOLN
INTRAMUSCULAR | Status: DC | PRN
  Administered 2017-11-22 (×2): 5 mL via PERINEURAL

## 2017-11-22 MED ORDER — ONDANSETRON HCL 4 MG/2ML IJ SOLN
4.0000 mg | Freq: Four times a day (QID) | INTRAMUSCULAR | Status: DC | PRN
  Administered 2017-11-22 – 2017-11-23 (×3): 4 mg via INTRAVENOUS
  Filled 2017-11-22 (×4): qty 2

## 2017-11-22 SURGICAL SUPPLY — 62 items
BANDAGE ACE 4X5 VEL STRL LF (GAUZE/BANDAGES/DRESSINGS) ×2 IMPLANT
BANDAGE ACE 6X5 VEL STRL LF (GAUZE/BANDAGES/DRESSINGS) ×2 IMPLANT
BANDAGE ESMARK 6X9 LF (GAUZE/BANDAGES/DRESSINGS) ×1 IMPLANT
BLADE SAGITTAL 25.0X1.19X90 (BLADE) ×2 IMPLANT
BLADE SAW SAG 90X13X1.27 (BLADE) ×2 IMPLANT
BNDG ESMARK 6X9 LF (GAUZE/BANDAGES/DRESSINGS) ×2
BOWL SMART MIX CTS (DISPOSABLE) ×2 IMPLANT
CAP KNEE TOTAL 3 SIGMA ×2 IMPLANT
CEMENT HV SMART SET (Cement) ×4 IMPLANT
COVER SURGICAL LIGHT HANDLE (MISCELLANEOUS) ×2 IMPLANT
CUFF TOURNIQUET SINGLE 34IN LL (TOURNIQUET CUFF) ×2 IMPLANT
CUFF TOURNIQUET SINGLE 44IN (TOURNIQUET CUFF) IMPLANT
DRAPE INCISE IOBAN 66X45 STRL (DRAPES) ×2 IMPLANT
DRAPE ORTHO SPLIT 77X108 STRL (DRAPES) ×2
DRAPE SURG ORHT 6 SPLT 77X108 (DRAPES) ×2 IMPLANT
DRAPE U-SHAPE 47X51 STRL (DRAPES) ×2 IMPLANT
DRSG ADAPTIC 3X8 NADH LF (GAUZE/BANDAGES/DRESSINGS) ×2 IMPLANT
DRSG PAD ABDOMINAL 8X10 ST (GAUZE/BANDAGES/DRESSINGS) ×4 IMPLANT
DURAPREP 26ML APPLICATOR (WOUND CARE) ×2 IMPLANT
ELECT REM PT RETURN 9FT ADLT (ELECTROSURGICAL) ×2
ELECTRODE REM PT RTRN 9FT ADLT (ELECTROSURGICAL) ×1 IMPLANT
EVACUATOR 1/8 PVC DRAIN (DRAIN) IMPLANT
FACESHIELD WRAPAROUND (MASK) ×2 IMPLANT
GAUZE SPONGE 4X4 12PLY STRL (GAUZE/BANDAGES/DRESSINGS) ×2 IMPLANT
GLOVE BIOGEL PI IND STRL 8 (GLOVE) ×4 IMPLANT
GLOVE BIOGEL PI INDICATOR 8 (GLOVE) ×4
GLOVE ORTHO TXT STRL SZ7.5 (GLOVE) ×2 IMPLANT
GLOVE SURG ORTHO 8.0 STRL STRW (GLOVE) ×2 IMPLANT
GOWN STRL REUS W/ TWL LRG LVL3 (GOWN DISPOSABLE) ×1 IMPLANT
GOWN STRL REUS W/ TWL XL LVL3 (GOWN DISPOSABLE) ×1 IMPLANT
GOWN STRL REUS W/TWL 2XL LVL3 (GOWN DISPOSABLE) ×2 IMPLANT
GOWN STRL REUS W/TWL LRG LVL3 (GOWN DISPOSABLE) ×1
GOWN STRL REUS W/TWL XL LVL3 (GOWN DISPOSABLE) ×1
HANDPIECE INTERPULSE COAX TIP (DISPOSABLE) ×1
HOOD PEEL AWAY FACE SHEILD DIS (HOOD) ×2 IMPLANT
IMMOBILIZER KNEE 22 UNIV (SOFTGOODS) ×2 IMPLANT
KIT BASIN OR (CUSTOM PROCEDURE TRAY) ×2 IMPLANT
KIT ROOM TURNOVER OR (KITS) ×2 IMPLANT
MANIFOLD NEPTUNE II (INSTRUMENTS) ×2 IMPLANT
NEEDLE 22X1 1/2 (OR ONLY) (NEEDLE) ×4 IMPLANT
NS IRRIG 1000ML POUR BTL (IV SOLUTION) ×2 IMPLANT
PACK TOTAL JOINT (CUSTOM PROCEDURE TRAY) ×2 IMPLANT
PACK UNIVERSAL I (CUSTOM PROCEDURE TRAY) ×2 IMPLANT
PAD ABD 8X10 STRL (GAUZE/BANDAGES/DRESSINGS) ×2 IMPLANT
PAD ARMBOARD 7.5X6 YLW CONV (MISCELLANEOUS) ×4 IMPLANT
PAD CAST 4YDX4 CTTN HI CHSV (CAST SUPPLIES) ×1 IMPLANT
PADDING CAST COTTON 4X4 STRL (CAST SUPPLIES) ×1
PADDING CAST COTTON 6X4 STRL (CAST SUPPLIES) ×2 IMPLANT
SET HNDPC FAN SPRY TIP SCT (DISPOSABLE) ×1 IMPLANT
STAPLER VISISTAT 35W (STAPLE) ×2 IMPLANT
SUCTION FRAZIER HANDLE 10FR (MISCELLANEOUS) ×1
SUCTION TUBE FRAZIER 10FR DISP (MISCELLANEOUS) ×1 IMPLANT
SUT ETHIBOND NAB CT1 #1 30IN (SUTURE) ×6 IMPLANT
SUT VIC AB 0 CT1 27 (SUTURE) ×1
SUT VIC AB 0 CT1 27XBRD ANBCTR (SUTURE) ×1 IMPLANT
SUT VIC AB 2-0 CT1 27 (SUTURE) ×2
SUT VIC AB 2-0 CT1 TAPERPNT 27 (SUTURE) ×2 IMPLANT
SYR CONTROL 10ML LL (SYRINGE) ×4 IMPLANT
TOWEL GREEN STERILE (TOWEL DISPOSABLE) ×2 IMPLANT
TRAY CATH 16FR W/PLASTIC CATH (SET/KITS/TRAYS/PACK) ×2 IMPLANT
TRAY FOLEY W/METER SILVER 16FR (SET/KITS/TRAYS/PACK) IMPLANT
WATER STERILE IRR 1000ML POUR (IV SOLUTION) ×2 IMPLANT

## 2017-11-22 NOTE — Anesthesia Postprocedure Evaluation (Signed)
Anesthesia Post Note  Patient: Olivia [name]  Procedure(s) Performed: RIGHT TOTAL KNEE ARTHROPLASTY (Right Knee)     Patient location during evaluation: PACU Anesthesia Type: Spinal Level of consciousness: awake Pain management: pain level controlled Vital Signs Assessment: post-procedure vital signs reviewed and stable Respiratory status: spontaneous breathing Cardiovascular status: stable Postop Assessment: no apparent nausea or vomiting Anesthetic complications: no    Last Vitals:  Vitals:   11/22/17 1035 11/22/17 1050  BP: 94/61 (!) 97/58  Pulse: 66 67  Resp: 12 18  Temp:    SpO2: 100% 100%    Last Pain:  Vitals:   11/22/17 0621  TempSrc: Oral  PainSc:    Pain Goal:                 [name]

## 2017-11-22 NOTE — Anesthesia Procedure Notes (Addendum)
Anesthesia Regional Block: Adductor canal block   Pre-Anesthetic Checklist: ,, timeout performed, Correct Patient, Correct Site, Correct Laterality, Correct Procedure, Correct Position, site marked, Risks and benefits discussed,  Surgical consent,  Pre-op evaluation,  At surgeon's request and post-op pain management  Laterality: Right and Lower  Prep: chloraprep       Needles:  Injection technique: Single-shot  Needle Type: Echogenic Stimulator Needle     Needle Length: 9cm  Needle Gauge: 21   Needle insertion depth: 4 cm   Additional Needles:   Procedures:,,,, ultrasound used (permanent image in chart),,,,  Narrative:  Start time: 11/22/2017 7:03 AM End time: 11/22/2017 7:12 AM Injection made incrementally with aspirations every 5 mL.  Performed by: Personally  Anesthesiologist: Lyn Hollingshead, MD

## 2017-11-22 NOTE — Transfer of Care (Signed)
Immediate Anesthesia Transfer of Care Note  Patient: LABREA ECCLESTON  Procedure(s) Performed: RIGHT TOTAL KNEE ARTHROPLASTY (Right Knee)  Patient Location: PACU  Anesthesia Type:MAC and Spinal  Level of Consciousness: awake, alert , oriented and patient cooperative  Airway & Oxygen Therapy: Patient Spontanous Breathing and Patient connected to nasal cannula oxygen  Post-op Assessment: Report given to RN and Post -op Vital signs reviewed and stable  Post vital signs: Reviewed and stable  Last Vitals:  Vitals:   11/22/17 0621 11/22/17 1005  BP: 116/72   Pulse: 65 71  Resp: 20 13  Temp: 36.9 C 36.5 C  SpO2: 96% 100%    Last Pain:  Vitals:   11/22/17 0621  TempSrc: Oral  PainSc:          Complications: No apparent anesthesia complications

## 2017-11-22 NOTE — Anesthesia Procedure Notes (Signed)
Procedure Name: MAC Date/Time: 11/22/2017 7:49 AM Performed by: Colin Benton, CRNA Pre-anesthesia Checklist: Patient identified, Emergency Drugs available, Suction available and Patient being monitored Patient Re-evaluated:Patient Re-evaluated prior to induction Oxygen Delivery Method: Simple face mask Induction Type: IV induction

## 2017-11-22 NOTE — Progress Notes (Signed)
Orthopedic Tech Progress Note Patient Details:  Olivia [name] [birth year] [id number]  CPM Right Knee CPM Right Knee: On Right Knee Flexion (Degrees): 90 Right Knee Extension (Degrees): 0 Additional Comments: trapeze bar patient helper  Post Interventions Patient Tolerated: Well Instructions Provided: Care of device  Hildred Priest 11/22/2017, 10:34 AM Viewed order from doctor's order list

## 2017-11-22 NOTE — Interval H&P Note (Signed)
History and Physical Interval Note:  11/22/2017 7:27 AM  Olivia [name]  has presented today for surgery, with the diagnosis of OA RIGHT KNEE  The various methods of treatment have been discussed with the patient and family. After consideration of risks, benefits and other options for treatment, the patient has consented to  Procedure(s): RIGHT TOTAL KNEE ARTHROPLASTY (Right) as a surgical intervention .  The patient's history has been reviewed, patient examined, no change in status, stable for surgery.  I have reviewed the patient's chart and labs.  Questions were answered to the patient's satisfaction.     Yvette Rack

## 2017-11-22 NOTE — Evaluation (Signed)
Physical Therapy Evaluation Patient Details Name: Olivia [name] MRN: [id number] DOB: 06-05-1951 Today's Date: 11/22/2017   History of Present Illness  Pt is a 67 y.o. female s/p R TKA On 11/22/17. PMH includes HTN, OSA, depression, anxiety.  Clinical Impression  Pt presents with an overall decrease in functional mobility secondary to above. PTA, pt indep and lives with son. Educ on precautions, positioning, therex, and importance of mobility. Today, pt declining OOB mobility secondary to pain, nausea, and lethargy. Able to perform supine LE therex, but very limited by pain. Pt would benefit from continued acute PT services to maximize functional mobility and independence prior to d/c with SNF-level therapies.     Follow Up Recommendations SNF;Supervision for mobility/OOB    Equipment Recommendations  Rolling walker with 5" wheels    Recommendations for Other Services OT consult     Precautions / Restrictions Precautions Precautions: Knee Precaution Booklet Issued: Yes (comment) Precaution Comments: Verbally reviewed precautions Restrictions Weight Bearing Restrictions: Yes RLE Weight Bearing: Weight bearing as tolerated      Mobility  Bed Mobility               General bed mobility comments: Pt declining OOB mobility secondary to significant pain and nausea; agreeable to supine LE therex  Transfers                    Ambulation/Gait                Stairs            Wheelchair Mobility    Modified Rankin (Stroke Patients Only)       Balance                                             Pertinent Vitals/Pain Pain Assessment: 0-10 Faces Pain Scale: Hurts whole lot Pain Location: R knee Pain Descriptors / Indicators: Operative site guarding;Grimacing;Crying Pain Intervention(s): Limited activity within patient's tolerance;Monitored during session;Ice applied    Home Living Family/patient expects to be discharged to::  Skilled nursing facility Living Arrangements: Children               Additional Comments: Pt plans to d/c to [facility] or [facility]    Prior Function Level of Independence: Independent         Comments: Does not work (on disability)     Journalist, newspaper        Extremity/Trunk Assessment   Upper Extremity Assessment Upper Extremity Assessment: Overall WFL for tasks assessed    Lower Extremity Assessment Lower Extremity Assessment: RLE deficits/detail RLE Deficits / Details: R hip flex 2/5, knee flex/ext 2/5 RLE: Unable to fully assess due to pain;Unable to fully assess due to immobilization       Communication   Communication: No difficulties  Cognition Arousal/Alertness: Lethargic;Suspect due to medications Behavior During Therapy: Flat affect Overall Cognitive Status: Within Functional Limits for tasks assessed                                        General Comments      Exercises Total Joint Exercises Ankle Circles/Pumps: AROM;Both;20 reps;Supine Quad Sets: AROM;Both;10 reps;Supine(5 sec hold) Towel Squeeze: AROM;Both;10 reps;Supine Heel Slides: AAROM;Right;10 reps;Supine(required significant assist) Goniometric ROM: R knee flexion PROM grossly  10-50'    Assessment/Plan    PT Assessment Patient needs continued PT services  PT Problem List Decreased strength;Decreased range of motion;Decreased activity tolerance;Decreased balance;Decreased mobility;Decreased knowledge of use of DME;Decreased knowledge of precautions;Pain       PT Treatment Interventions DME instruction;Gait training;Stair training;Functional mobility training;Therapeutic exercise;Therapeutic activities;Balance training;Patient/family education    PT Goals (Current goals can be found in the Care Plan section)  Acute Rehab PT Goals Patient Stated Goal: Decreased pain and nausea PT Goal Formulation: With patient Time For Goal Achievement: 12/06/17 Potential to  Achieve Goals: Good    Frequency 7X/week   Barriers to discharge        Co-evaluation               AM-PAC PT "6 Clicks" Daily Activity  Outcome Measure Difficulty turning over in bed (including adjusting bedclothes, sheets and blankets)?: Unable Difficulty moving from lying on back to sitting on the side of the bed? : Unable Difficulty sitting down on and standing up from a chair with arms (e.g., wheelchair, bedside commode, etc,.)?: Unable Help needed moving to and from a bed to chair (including a wheelchair)?: A Lot Help needed walking in hospital room?: A Lot Help needed climbing 3-5 steps with a railing? : Total 6 Click Score: 8    End of Session   Activity Tolerance: Patient limited by pain Patient left: in bed;with call bell/phone within reach;with nursing/sitter in room Nurse Communication: Mobility status PT Visit Diagnosis: Other abnormalities of gait and mobility (R26.89);Pain Pain - Right/Left: Right Pain - part of body: Knee    Time: 0160-1093 PT Time Calculation (min) (ACUTE ONLY): 19 min   Charges:   PT Evaluation $PT Eval Moderate Complexity: 1 Mod     PT G Codes:       Mabeline Caras, PT, DPT Acute Rehab Services  Pager: [place] 11/22/2017, 5:28 PM

## 2017-11-22 NOTE — Op Note (Signed)
NAME:  Olivia [name], Olivia [name] NO.:  [id number]  MEDICAL RECORD NO.:  [id number]  LOCATION:  [facility]                         FACILITY:  [facility]  PHYSICIAN:  Lockie Pares, M.D.    DATE OF BIRTH:  26-Jan-1951  DATE OF PROCEDURE:  11/22/2017 DATE OF DISCHARGE:                              OPERATIVE REPORT   PREOPERATIVE DIAGNOSIS:  Severe osteoarthritis, right knee.  POSTOPERATIVE DIAGNOSIS:  Severe osteoarthritis, right knee.  OPERATION:  Right total knee replacement (Sigma size 4 femur, size 4 tibial, 10-mm bearing with size 3 tibial tray, and 35-mm all-poly patella.  SURGEON:  Lockie Pares, MD.  ASSISTANTMarjo Bicker, PA.  TOURNIQUET TIME:  62 minutes.  DESCRIPTION OF PROCEDURE:  Sterile prep and drape, supine position, exsanguination of leg, inflation tourniquet to 350.  Straight skin incision was made with a medial parapatellar approach to the knee made. We identified the distal femur and made an 11-mm 5-degree valgus cut on the distal femur followed by cutting about 3-4 mm below the most- diseased medial compartment with the extension gap measured at 10 mm. The femur was sized to be a size 4 with placement of the all-in-1 cutting block in appropriate degree of external rotation, placed in anterior, posterior, and chamfer cuts.  The flexion gap equaled the extension gap at 10 mm.  PCL was released.  Posterior stripping of osteophytes from the knee.  All menisci remnants were removed.  Tibia was sized to be a size 3 followed by placement of the keel for the tibia, the box cut on the femur.  Trial components were inserted.  Full extension was noted with the resolution of the varus deformity with good stability.  Patella was cut leaving about 14 mm of native patella followed by placement of a 35-mm all-poly trial.  All trials were deemed to be acceptable with full extension, good balancing, no tendency for dislocation or bearing spin out.  Final components were  inserted with cement in the doughy state, tibia followed by femur patella.  We used a trial bearing.  The cement was hardened.  The trial bearing was deemed to be acceptable in terms of the parameters listed above.  We then removed excess cement from the posterior aspect of the knee.  Tourniquet was released.  No excessive bleeding was noted.  The capsular structures and subcutaneous tissues were infiltrated with a mixture of Exparel and Marcaine.  Final bearing was inserted.  Again, full extension, excellent balance and no tendency for bearing dislocation or spin-out.  Closure on the capsule was affected with #1 Tycron, subcutaneous tissue with 0 and 2-0 Vicryl, skin with skin clips.  Lightly compressive sterile dressing applied, taken to the recovery room in stable condition.     Lockie Pares, M.D.     WDC/MEDQ  D:  11/22/2017  T:  11/22/2017  Job:  [id number]

## 2017-11-22 NOTE — Brief Op Note (Signed)
11/22/2017  9:52 AM  PATIENT:  Olivia [name]  67 y.o. female  PRE-OPERATIVE DIAGNOSIS:  OA RIGHT KNEE  POST-OPERATIVE DIAGNOSIS:  OA RIGHT KNEE  PROCEDURE:  Procedure(s): RIGHT TOTAL KNEE ARTHROPLASTY (Right)  SURGEON:  Surgeon(s) and Role:    Earlie Server, MD - Primary  PHYSICIAN ASSISTANT: Chriss Czar, PA-C  ASSISTANTS:  ANESTHESIA:   local, spinal and IV sedation  EBL:  100 mL   BLOOD ADMINISTERED:none  DRAINS: none   LOCAL MEDICATIONS USED:  MARCAINE     SPECIMEN:  No Specimen  DISPOSITION OF SPECIMEN:  N/A  COUNTS:  YES  TOURNIQUET:  * Missing tourniquet times found for documented tourniquets in log: [id number] *  DICTATION: .Other Dictation: Dictation Number unknown  PLAN OF CARE: Admit to inpatient   PATIENT DISPOSITION:  PACU - hemodynamically stable.   Delay start of Pharmacological VTE agent (>24hrs) due to surgical blood loss or risk of bleeding: yes

## 2017-11-23 ENCOUNTER — Encounter (HOSPITAL_COMMUNITY): Payer: Self-pay | Admitting: Orthopedic Surgery

## 2017-11-23 DIAGNOSIS — K219 Gastro-esophageal reflux disease without esophagitis: Secondary | ICD-10-CM | POA: Diagnosis present

## 2017-11-23 DIAGNOSIS — F329 Major depressive disorder, single episode, unspecified: Secondary | ICD-10-CM | POA: Diagnosis present

## 2017-11-23 DIAGNOSIS — E039 Hypothyroidism, unspecified: Secondary | ICD-10-CM | POA: Diagnosis present

## 2017-11-23 DIAGNOSIS — I10 Essential (primary) hypertension: Secondary | ICD-10-CM | POA: Diagnosis present

## 2017-11-23 DIAGNOSIS — E785 Hyperlipidemia, unspecified: Secondary | ICD-10-CM | POA: Diagnosis present

## 2017-11-23 DIAGNOSIS — E871 Hypo-osmolality and hyponatremia: Secondary | ICD-10-CM | POA: Diagnosis present

## 2017-11-23 DIAGNOSIS — F419 Anxiety disorder, unspecified: Secondary | ICD-10-CM | POA: Diagnosis present

## 2017-11-23 LAB — CBC
HCT: 34.9 % — ABNORMAL LOW (ref 36.0–46.0)
HEMOGLOBIN: 11.5 g/dL — AB (ref 12.0–15.0)
MCH: 29.5 pg (ref 26.0–34.0)
MCHC: 33 g/dL (ref 30.0–36.0)
MCV: 89.5 fL (ref 78.0–100.0)
PLATELETS: 326 10*3/uL (ref 150–400)
RBC: 3.9 MIL/uL (ref 3.87–5.11)
RDW: 13.9 % (ref 11.5–15.5)
WBC: 9.4 10*3/uL (ref 4.0–10.5)

## 2017-11-23 LAB — BASIC METABOLIC PANEL
Anion gap: 11 (ref 5–15)
BUN: 6 mg/dL (ref 6–20)
CHLORIDE: 92 mmol/L — AB (ref 101–111)
CO2: 27 mmol/L (ref 22–32)
CREATININE: 0.62 mg/dL (ref 0.44–1.00)
Calcium: 9 mg/dL (ref 8.9–10.3)
Glucose, Bld: 130 mg/dL — ABNORMAL HIGH (ref 65–99)
POTASSIUM: 4.6 mmol/L (ref 3.5–5.1)
SODIUM: 130 mmol/L — AB (ref 135–145)

## 2017-11-23 LAB — OSMOLALITY: OSMOLALITY: 264 mosm/kg — AB (ref 275–295)

## 2017-11-23 MED ORDER — ACETAMINOPHEN 650 MG RE SUPP: 650.0000 mg | RECTAL | Status: DC

## 2017-11-23 MED ORDER — ACETAMINOPHEN 500 MG PO TABS
1000.0000 mg | ORAL_TABLET | Freq: Four times a day (QID) | ORAL | Status: DC
  Administered 2017-11-23 – 2017-11-25 (×7): 1000 mg via ORAL
  Filled 2017-11-23 (×8): qty 2

## 2017-11-23 MED ORDER — CELECOXIB 200 MG PO CAPS
200.0000 mg | ORAL_CAPSULE | Freq: Two times a day (BID) | ORAL | Status: DC
  Administered 2017-11-23 – 2017-11-25 (×5): 200 mg via ORAL
  Filled 2017-11-23 (×5): qty 1

## 2017-11-23 MED ORDER — HYDROMORPHONE HCL 1 MG/ML IJ SOLN
0.5000 mg | INTRAMUSCULAR | Status: DC | PRN
  Administered 2017-11-23 – 2017-11-24 (×3): 0.5 mg via INTRAVENOUS
  Filled 2017-11-23 (×3): qty 1

## 2017-11-23 MED ORDER — ACETAMINOPHEN 500 MG PO TABS
1000.0000 mg | ORAL_TABLET | ORAL | Status: DC
  Administered 2017-11-23 (×2): 1000 mg via ORAL
  Filled 2017-11-23 (×2): qty 2

## 2017-11-23 NOTE — Progress Notes (Signed)
Orthopedic Trauma Service Progress Note weekend coverage  Patient ID: Olivia [name] MRN: [id number] DOB/AGE: 11/17/1950 67 y.o.  Subjective:  Doing fair Pain is pretty severe Sitting in bedside chair Just received some IV Dilaudid and is pretty somnolent  States she was in her CPM this morning and tolerated it fairly well Not all that hungry, somewhat nauseated, no vomiting  Denies any chest pain or shortness of breath No abdominal pain No headaches or light headedness No dizziness   ROS As above  Objective:   VITALS:   Vitals:   11/22/17 1349 11/22/17 2015 11/23/17 0001 11/23/17 0558  BP: 132/75 114/60 137/75 140/75  Pulse: 70 75 83 (!) 106  Resp:      Temp: 97.9 F (36.6 C)  97.9 F (36.6 C) 98.2 F (36.8 C)  TempSrc: Oral  Oral Oral  SpO2: 97% 99% 96% 96%  Weight:      Height:        Estimated body mass index is 40.41 kg/m as calculated from the following:   Height as of this encounter: 5\' 7"  (1.702 m).   Weight as of this encounter: 117 kg (258 lb).   Intake/Output      02/22 0701 - 02/23 0700 02/23 0701 - 02/24 0700   P.O. 100 120   I.V. (mL/kg) 1700 (14.5)    IV Piggyback 410    Total Intake(mL/kg) 2210 (18.9) 120 (1)   Urine (mL/kg/hr) 2650 (0.9)    Blood 100    Total Output 2750    Net -540 +120          LABS  Results for orders placed or performed during the hospital encounter of 11/22/17 (from the past 24 hour(s))  CBC     Status: Abnormal   Collection Time: 11/23/17  5:15 AM  Result Value Ref Range   WBC 9.4 4.0 - 10.5 K/uL   RBC 3.90 3.87 - 5.11 MIL/uL   Hemoglobin 11.5 (L) 12.0 - 15.0 g/dL   HCT 34.9 (L) 36.0 - 46.0 %   MCV 89.5 78.0 - 100.0 fL   MCH 29.5 26.0 - 34.0 pg   MCHC 33.0 30.0 - 36.0 g/dL   RDW 13.9 11.5 - 15.5 %   Platelets 326 150 - 400 K/uL  Basic metabolic panel     Status: Abnormal   Collection Time: 11/23/17  5:15 AM  Result Value Ref Range   Sodium 130 (L) 135 - 145 mmol/L   Potassium  4.6 3.5 - 5.1 mmol/L   Chloride 92 (L) 101 - 111 mmol/L   CO2 27 22 - 32 mmol/L   Glucose, Bld 130 (H) 65 - 99 mg/dL   BUN 6 6 - 20 mg/dL   Creatinine, Ser 0.62 0.44 - 1.00 mg/dL   Calcium 9.0 8.9 - 10.3 mg/dL   GFR calc non Af Amer >60 >60 mL/min   GFR calc Af Amer >60 >60 mL/min   Anion gap 11 5 - 15     PHYSICAL EXAM:   Gen: Sitting in bedside chair, answers questions appropriately but is having a rough time keeping her eyes open Lungs: Clear to auscultation bilaterally, no wheezes or rhonchi noted Cardiac: Regular rate and rhythm, S1 and S2 Abd: Soft, nontender, nondistended,  + bowel sounds Ext:       Right lower extremity  Dressing is clean, dry and intact  Knee immobilizer is in place  Moderate swelling distally  EHL, FHL, lesser toe motor functions are intact  Ankle  flexion, extension, inversion and eversion are intact  Patient can perform a quad set  DPN, SPN, TN sensory functions are grossly intact  Extremity is warm  + DP pulse  No pain with passive stretching of her lower leg compartments   Assessment/Plan: 1 Day Post-Op   Principal Problem:   Primary localized osteoarthritis of right knee Active Problems:   Hyponatremia   Hypothyroidism   Hypertension   Hyperlipidemia   GERD (gastroesophageal reflux disease)   Depression   Anxiety   Anti-infectives (From admission, onward)   Start     Dose/Rate Route Frequency Ordered Stop   11/22/17 1800  vancomycin (VANCOCIN) IVPB 1000 mg/200 mL premix     1,000 mg 200 mL/hr over 60 Minutes Intravenous Every 12 hours 11/22/17 1206 11/22/17 1823   11/22/17 0715  vancomycin (VANCOCIN) 1,500 mg in sodium chloride 0.9 % 500 mL IVPB     1,500 mg 250 mL/hr over 120 Minutes Intravenous To Surgery 11/21/17 1127 11/22/17 0805    .  POD/HD#: 42  67 year old female status post right total knee arthroplasty for end-stage DJD right knee   -End-stage DJD of the right knee s/p right total knee arthroplasty  Weight-bear  as tolerated  CPM  PT and OT evaluations  Dressing change tomorrow  Encourage bone foam use when in bed  Ice and elevate  Toe and ankle motion to help with swelling control as well   Hopeful for skilled nursing facility on Monday  - Pain management:  Will adjust pain medications  Patient on numerous medications preoperatively including depression medication and benzodiazepines.  We will try to minimize her narcotics  Change in Dilaudid to 0.5 mg IV every 4 hours as needed for severe breakthrough pain   Start Tylenol 1000 mg po q6h scheduled   Celebrex 200 mg po q12h schduled    Oxy IR 5-10 mg po q3h prn pain   - ABL anemia/Hemodynamics  Stable  Cbc in am   - Medical issues   Home meds   Hyponatremia   Hold HCTZ/lisinopril   Check serum osmolality   - DVT/PE prophylaxis:  eliquis   - ID:   periop abx   - Activity:  Up with therapy   - FEN/GI prophylaxis/Foley/Lines:  Reg diet  protonix   - Dispo:  Therapies   SNF Monday      Jari Pigg, PA-C [facility] [phone] Levi Aland (C) 11/23/2017, 10:38 AM

## 2017-11-23 NOTE — Evaluation (Signed)
Occupational Therapy Evaluation Patient Details Name: Olivia [name] MRN: [id number] DOB: 1950/10/26 Today's Date: 11/23/2017    History of Present Illness Pt is a 67 y.o. female s/p R TKA On 11/22/17. PMH includes HTN, OSA, depression, anxiety.   Clinical Impression   PTA Pt independent in ADL/IADL and mobility. Today Pt presents with max A for LB ADL and mod +2 for functional transfers. Unsure of Pt's baseline cognition but required very detailed step by step instructions. (ex: if therapist said, use your hand to help you push up, she would reply "What do you mean, how do I do that?"). Pt will require continued skilled OT in the acute setting and afterwards at SNF level to maximize safety and independence in ADL and functional transfers and return to PLOF with support from son who she lives with. Next session work on seated balance and intro to AE for LB ADL.     Follow Up Recommendations  SNF;Supervision/Assistance - 24 hour    Equipment Recommendations  Other (comment)(defer to next venue)    Recommendations for Other Services       Precautions / Restrictions Precautions Precautions: Knee Precaution Booklet Issued: Yes (comment) Precaution Comments: Verbally reviewed precautions Required Braces or Orthoses: Knee Immobilizer - Right Restrictions Weight Bearing Restrictions: Yes RLE Weight Bearing: Weight bearing as tolerated      Mobility Bed Mobility Overal bed mobility: Needs Assistance Bed Mobility: Supine to Sit     Supine to sit: Mod assist;HOB elevated     General bed mobility comments: multimodal cues for sequencing; assistance for RLE EOB and trunk elevation.  once sitting, patient able to scoot hips forwards to EOB.    Transfers Overall transfer level: Needs assistance Equipment used: Rolling walker (2 wheeled) Transfers: Sit to/from [facility] Sit to Stand: Mod assist Stand pivot transfers: Mod assist;+2 safety/equipment       General  transfer comment: cues for hand placement, use of UE's on RW to stand upright & offload weight from painful RLE, sequencing during pivot.  Pt's feet did not clear floor - more of a side scoot shimmy    Balance Overall balance assessment: Needs assistance Sitting-balance support: No upper extremity supported;Feet supported Sitting balance-Leahy Scale: Fair Sitting balance - Comments: when attempting LB dressing requires support   Standing balance support: Bilateral upper extremity supported;During functional activity Standing balance-Leahy Scale: Poor Standing balance comment: reliant on external support                           ADL either performed or assessed with clinical judgement   ADL Overall ADL's : Needs assistance/impaired Eating/Feeding: Modified independent   Grooming: Wash/dry hands;Wash/dry face;Set up;Sitting Grooming Details (indicate cue type and reason): in recliner - warm wash cloth  Upper Body Bathing: Minimal assistance   Lower Body Bathing: Maximal assistance   Upper Body Dressing : Min guard   Lower Body Dressing: Total assistance Lower Body Dressing Details (indicate cue type and reason): to don socks Toilet Transfer: Moderate assistance;+2 for physical assistance;+2 for safety/equipment;Cueing for sequencing;Stand-pivot;Cueing for safety Toilet Transfer Details (indicate cue type and reason): mod A + 2 Pt trouble with sequencing and problem solving with delayed responses at times - unsure of baseline Toileting- Clothing Manipulation and Hygiene: Total assistance       Functional mobility during ADLs: Moderate assistance;+2 for physical assistance;+2 for safety/equipment;Rolling walker(SPT only) General ADL Comments: unsure of patients baseline cognition - needed instructions for everything and  infrequent but sometimes had delayed processing     Vision Patient Visual Report: No change from baseline       Perception     Praxis       Pertinent Vitals/Pain Pain Assessment: 0-10 Pain Score: 6  Pain Location: R knee Pain Descriptors / Indicators: Aching;Operative site guarding;Sore Pain Intervention(s): Monitored during session;Repositioned;Ice applied     Hand Dominance     Extremity/Trunk Assessment Upper Extremity Assessment Upper Extremity Assessment: Overall WFL for tasks assessed   Lower Extremity Assessment Lower Extremity Assessment: Defer to PT evaluation       Communication Communication Communication: No difficulties   Cognition Arousal/Alertness: Awake/alert Behavior During Therapy: WFL for tasks assessed/performed Overall Cognitive Status: No family/caregiver present to determine baseline cognitive functioning Area of Impairment: Safety/judgement;Problem solving;Following commands                       Following Commands: Follows one step commands with increased time Safety/Judgement: Decreased awareness of safety;Decreased awareness of deficits   Problem Solving: Slow processing;Difficulty sequencing;Requires verbal cues;Requires tactile cues     General Comments       Exercises     Shoulder Instructions      Home Living Family/patient expects to be discharged to:: Skilled nursing facility Living Arrangements: Children                               Additional Comments: Pt plans to d/c to [facility] or [facility]      Prior Functioning/Environment Level of Independence: Independent        Comments: Does not work (on disability)        OT Problem List: Decreased activity tolerance;Impaired balance (sitting and/or standing);Decreased safety awareness;Decreased knowledge of use of DME or AE;Decreased knowledge of precautions;Obesity;Pain      OT Treatment/Interventions: Self-care/ADL training;DME and/or AE instruction;Manual therapy;Therapeutic activities;Patient/family education;Balance training    OT Goals(Current goals can be found in the care  plan section) Acute Rehab OT Goals Patient Stated Goal: Decreased pain and nausea OT Goal Formulation: With patient Time For Goal Achievement: 12/07/17 Potential to Achieve Goals: Good ADL Goals Pt Will Perform Lower Body Bathing: with supervision;with adaptive equipment;sitting/lateral leans Pt Will Perform Lower Body Dressing: with min assist;sit to/from stand;with adaptive equipment Pt Will Transfer to Toilet: with supervision;ambulating Pt Will Perform Toileting - Clothing Manipulation and hygiene: with min guard assist;sit to/from stand;with caregiver independent in assisting Additional ADL Goal #1: Pt will perform bed mobility at min guard level prior to engaging in ADL activity  OT Frequency: Min 2X/week   Barriers to D/C:            Co-evaluation              AM-PAC PT "6 Clicks" Daily Activity     Outcome Measure Help from another person eating meals?: None Help from another person taking care of personal grooming?: None(in seated position) Help from another person toileting, which includes using toliet, bedpan, or urinal?: A Lot Help from another person bathing (including washing, rinsing, drying)?: A Lot Help from another person to put on and taking off regular upper body clothing?: A Little Help from another person to put on and taking off regular lower body clothing?: Total 6 Click Score: 16   End of Session Equipment Utilized During Treatment: Gait belt;Rolling walker CPM Right Knee CPM Right Knee: Off Nurse Communication: Mobility status  Activity Tolerance: Patient  tolerated treatment well Patient left: in chair;with call bell/phone within reach  OT Visit Diagnosis: Unsteadiness on feet (R26.81);Other abnormalities of gait and mobility (R26.89);Muscle weakness (generalized) (M62.81);Pain Pain - Right/Left: Right Pain - part of body: Knee                Time: 1975-8832 OT Time Calculation (min): 22 min Charges:  OT General Charges $OT Visit: 1  Visit OT Evaluation $OT Eval Moderate Complexity: 1 Mod G-Codes:     Hulda Humphrey OTR/L [place] 11/23/2017, 4:43 PM

## 2017-11-23 NOTE — Progress Notes (Signed)
Pt continues to complain of high levels of pain and nausea. Refuses to use bone foam for knee extension. Removed pillow that she had placed under her knee and moved in to her foot.

## 2017-11-23 NOTE — Progress Notes (Signed)
Physical Therapy Treatment Patient Details Name: Olivia [name] MRN: [id number] DOB: January 19, 1951 Today's Date: 11/23/2017    History of Present Illness Pt is a 67 y.o. female s/p R TKA On 11/22/17. PMH includes HTN, OSA, depression, anxiety.    PT Comments    Pt pleasant & agreeable to participate in PT session.  Requires mod assist to transition supine to sitting EOB with HOB elevated, requires heavy mod assist to achieve standing by bedside, and +2 mod assist for safety with stand pivot transfer from bed>recliner. Cont to recommend d/c to SNF when medically ready to maximize safety and independence with functional mobility prior to returning home.      Follow Up Recommendations  SNF;Supervision for mobility/OOB     Equipment Recommendations  Rolling walker with 5" wheels    Recommendations for Other Services       Precautions / Restrictions Precautions Precautions: Knee Precaution Booklet Issued: Yes (comment) Precaution Comments: Verbally reviewed precautions Required Braces or Orthoses: Knee Immobilizer - Right Restrictions RLE Weight Bearing: Weight bearing as tolerated    Mobility  Bed Mobility Overal bed mobility: Needs Assistance Bed Mobility: Supine to Sit     Supine to sit: Mod assist;HOB elevated     General bed mobility comments: max directional cues; assistance for RLE & to lift shoulders/trunk to sitting upright.  once sitting, patient able to scoot hips forwards to EOB.    Transfers Overall transfer level: Needs assistance Equipment used: Rolling walker (2 wheeled) Transfers: Sit to/from [facility] Sit to Stand: Mod assist Stand pivot transfers: Mod assist;+2 safety/equipment       General transfer comment: cues for hand placement, use of UE's on RW to stand upright & offload weight from painful RLE, sequencing during pivot.  Pt shimmy's feet along floor to pivot.    Ambulation/Gait                 Stairs             Wheelchair Mobility    Modified Rankin (Stroke Patients Only)       Balance                                            Cognition Arousal/Alertness: Awake/alert Behavior During Therapy: WFL for tasks assessed/performed Overall Cognitive Status: No family/caregiver present to determine baseline cognitive functioning                                 General Comments: difficulty problem solving.       Exercises Total Joint Exercises Ankle Circles/Pumps: AROM;Both;15 reps Quad Sets: AROM;Both;10 reps Heel Slides: AAROM;Strengthening;Right;10 reps Hip ABduction/ADduction: AAROM;Strengthening;Right;10 reps Straight Leg Raises: AAROM;Strengthening;Right;10 reps    General Comments        Pertinent Vitals/Pain Pain Assessment: 0-10 Pain Score: 5  Pain Location: R knee Pain Descriptors / Indicators: Aching;Operative site guarding Pain Intervention(s): Limited activity within patient's tolerance;Monitored during session;Premedicated before session;Repositioned;Ice applied    Home Living                      Prior Function            PT Goals (current goals can now be found in the care plan section) Acute Rehab PT Goals Patient Stated Goal: Decreased pain and nausea PT  Goal Formulation: With patient Time For Goal Achievement: 12/06/17 Potential to Achieve Goals: Good    Frequency    7X/week      PT Plan Current plan remains appropriate    Co-evaluation              AM-PAC PT "6 Clicks" Daily Activity  Outcome Measure  Difficulty turning over in bed (including adjusting bedclothes, sheets and blankets)?: Unable Difficulty moving from lying on back to sitting on the side of the bed? : Unable Difficulty sitting down on and standing up from a chair with arms (e.g., wheelchair, bedside commode, etc,.)?: Unable Help needed moving to and from a bed to chair (including a wheelchair)?: A Lot Help needed walking in  hospital room?: A Lot Help needed climbing 3-5 steps with a railing? : Total 6 Click Score: 8    End of Session Equipment Utilized During Treatment: Gait belt;Right knee immobilizer Activity Tolerance: Patient limited by pain Patient left: in chair;with call bell/phone within reach Nurse Communication: Mobility status(recommendation of +2 for transfer) PT Visit Diagnosis: Other abnormalities of gait and mobility (R26.89);Pain Pain - Right/Left: Right Pain - part of body: Knee     Time: 0947-0962 PT Time Calculation (min) (ACUTE ONLY): 37 min  Charges:  $Therapeutic Activity: 23-37 mins                    G CodesSarajane [name], [place] [phone] 11/23/2017

## 2017-11-23 NOTE — Discharge Instructions (Signed)
INSTRUCTIONS AFTER JOINT REPLACEMENT  ° °o Remove items at home which could result in a fall. This includes throw rugs or furniture in walking pathways °o ICE to the affected joint every three hours while awake for 30 minutes at a time, for at least the first 3-5 days, and then as needed for pain and swelling.  Continue to use ice for pain and swelling. You may notice swelling that will progress down to the foot and ankle.  This is normal after surgery.  Elevate your leg when you are not up walking on it.   °o Continue to use the breathing machine you got in the hospital (incentive spirometer) which will help keep your temperature down.  It is common for your temperature to cycle up and down following surgery, especially at night when you are not up moving around and exerting yourself.  The breathing machine keeps your lungs expanded and your temperature down. ° ° °DIET:  As you were doing prior to hospitalization, we recommend a well-balanced diet. ° °DRESSING / WOUND CARE / SHOWERING ° °You may change your dressing 3-5 days after surgery.  Then change the dressing every day with sterile gauze.  Please use good hand washing techniques before changing the dressing.  Do not use any lotions or creams on the incision until instructed by your surgeon. ° °ACTIVITY ° °o Increase activity slowly as tolerated, but follow the weight bearing instructions below.   °o No driving for 6 weeks or until further direction given by your physician.  You cannot drive while taking narcotics.  °o No lifting or carrying greater than 10 lbs. until further directed by your surgeon. °o Avoid periods of inactivity such as sitting longer than an hour when not asleep. This helps prevent blood clots.  °o You may return to work once you are authorized by your doctor.  ° ° ° °WEIGHT BEARING  ° °Weight bearing as tolerated with assist device (walker, cane, etc) as directed, use it as long as suggested by your surgeon or therapist, typically at  least 4-6 weeks. ° ° °EXERCISES ° °Results after joint replacement surgery are often greatly improved when you follow the exercise, range of motion and muscle strengthening exercises prescribed by your doctor. Safety measures are also important to protect the joint from further injury. Any time any of these exercises cause you to have increased pain or swelling, decrease what you are doing until you are comfortable again and then slowly increase them. If you have problems or questions, call your caregiver or physical therapist for advice.  ° °Rehabilitation is important following a joint replacement. After just a few days of immobilization, the muscles of the leg can become weakened and shrink (atrophy).  These exercises are designed to build up the tone and strength of the thigh and leg muscles and to improve motion. Often times heat used for twenty to thirty minutes before working out will loosen up your tissues and help with improving the range of motion but do not use heat for the first two weeks following surgery (sometimes heat can increase post-operative swelling).  ° °These exercises can be done on a training (exercise) mat, on the floor, on a table or on a bed. Use whatever works the best and is most comfortable for you.    Use music or television while you are exercising so that the exercises are a pleasant break in your day. This will make your life better with the exercises acting as a break   in your routine that you can look forward to.   Perform all exercises about fifteen times, three times per day or as directed.  You should exercise both the operative leg and the other leg as well. ° °Exercises include: °  °• Quad Sets - Tighten up the muscle on the front of the thigh (Quad) and hold for 5-10 seconds.   °• Straight Leg Raises - With your knee straight (if you were given a brace, keep it on), lift the leg to 60 degrees, hold for 3 seconds, and slowly lower the leg.  Perform this exercise against  resistance later as your leg gets stronger.  °• Leg Slides: Lying on your back, slowly slide your foot toward your buttocks, bending your knee up off the floor (only go as far as is comfortable). Then slowly slide your foot back down until your leg is flat on the floor again.  °• Angel Wings: Lying on your back spread your legs to the side as far apart as you can without causing discomfort.  °• Hamstring Strength:  Lying on your back, push your heel against the floor with your leg straight by tightening up the muscles of your buttocks.  Repeat, but this time bend your knee to a comfortable angle, and push your heel against the floor.  You may put a pillow under the heel to make it more comfortable if necessary.  ° °A rehabilitation program following joint replacement surgery can speed recovery and prevent re-injury in the future due to weakened muscles. Contact your doctor or a physical therapist for more information on knee rehabilitation.  ° ° °CONSTIPATION ° °Constipation is defined medically as fewer than three stools per week and severe constipation as less than one stool per week.  Even if you have a regular bowel pattern at home, your normal regimen is likely to be disrupted due to multiple reasons following surgery.  Combination of anesthesia, postoperative narcotics, change in appetite and fluid intake all can affect your bowels.  ° °YOU MUST use at least one of the following options; they are listed in order of increasing strength to get the job done.  They are all available over the counter, and you may need to use some, POSSIBLY even all of these options:   ° °Drink plenty of fluids (prune juice may be helpful) and high fiber foods °Colace 100 mg by mouth twice a day  °Senokot for constipation as directed and as needed Dulcolax (bisacodyl), take with full glass of water  °Miralax (polyethylene glycol) once or twice a day as needed. ° °If you have tried all these things and are unable to have a bowel  movement in the first 3-4 days after surgery call either your surgeon or your primary doctor.   ° °If you experience loose stools or diarrhea, hold the medications until you stool forms back up.  If your symptoms do not get better within 1 week or if they get worse, check with your doctor.  If you experience "the worst abdominal pain ever" or develop nausea or vomiting, please contact the office immediately for further recommendations for treatment. ° ° °ITCHING:  If you experience itching with your medications, try taking only a single pain pill, or even half a pain pill at a time.  You can also use Benadryl over the counter for itching or also to help with sleep.  ° °TED HOSE STOCKINGS:  Use stockings on both legs until for at least 2 weeks or as   directed by physician office. They may be removed at night for sleeping. ° °MEDICATIONS:  See your medication summary on the “After Visit Summary” that nursing will review with you.  You may have some home medications which will be placed on hold until you complete the course of blood thinner medication.  It is important for you to complete the blood thinner medication as prescribed. ° °PRECAUTIONS:  If you experience chest pain or shortness of breath - call 911 immediately for transfer to the hospital emergency department.  ° °If you develop a fever greater that 101 F, purulent drainage from wound, increased redness or drainage from wound, foul odor from the wound/dressing, or calf pain - CONTACT YOUR SURGEON.   °                                                °FOLLOW-UP APPOINTMENTS:  If you do not already have a post-op appointment, please call the office for an appointment to be seen by your surgeon.  Guidelines for how soon to be seen are listed in your “After Visit Summary”, but are typically between 1-4 weeks after surgery. ° °OTHER INSTRUCTIONS:  ° °Knee Replacement:  Do not place pillow under knee, focus on keeping the knee straight while resting. CPM  instructions: 0-90 degrees, 2 hours in the morning, 2 hours in the afternoon, and 2 hours in the evening. Place foam block, curve side up under heel at all times except when in CPM or when walking.  DO NOT modify, tear, cut, or change the foam block in any way. ° °MAKE SURE YOU:  °• Understand these instructions.  °• Get help right away if you are not doing well or get worse.  ° ° °Thank you for letting us be a part of your medical care team.  It is a privilege we respect greatly.  We hope these instructions will help you stay on track for a fast and full recovery!  ° ° ° °Information on my medicine - ELIQUIS® (apixaban) ° °This medication education was reviewed with me or my healthcare representative as part of my discharge preparation.  The pharmacist that spoke with me during my hospital stay was:  Andilynn Delavega Dien, RPH ° °Why was Eliquis® prescribed for you? °Eliquis® was prescribed for you to reduce the risk of blood clots forming after orthopedic surgery.   ° °What do You need to know about Eliquis®? °Take your Eliquis® TWICE DAILY - one tablet in the morning and one tablet in the evening with or without food.  It would be best to take the dose about the same time each day. ° °If you have difficulty swallowing the tablet whole please discuss with your pharmacist how to take the medication safely. ° °Take Eliquis® exactly as prescribed by your doctor and DO NOT stop taking Eliquis® without talking to the doctor who prescribed the medication.  Stopping without other medication to take the place of Eliquis® may increase your risk of developing a clot. ° °After discharge, you should have regular check-up appointments with your healthcare provider that is prescribing your Eliquis®. ° °What do you do if you miss a dose? °If a dose of ELIQUIS® is not taken at the scheduled time, take it as soon as possible on the same day and twice-daily administration should be resumed.  The dose should not   be doubled to make up for  a missed dose.  Do not take more than one tablet of ELIQUIS at the same time. ° °Important Safety Information °A possible side effect of Eliquis® is bleeding. You should call your healthcare provider right away if you experience any of the following: °? Bleeding from an injury or your nose that does not stop. °? Unusual colored urine (red or dark brown) or unusual colored stools (red or black). °? Unusual bruising for unknown reasons. °? A serious fall or if you hit your head (even if there is no bleeding). ° °Some medicines may interact with Eliquis® and might increase your risk of bleeding or clotting while on Eliquis®. To help avoid this, consult your healthcare provider or pharmacist prior to using any new prescription or non-prescription medications, including herbals, vitamins, non-steroidal anti-inflammatory drugs (NSAIDs) and supplements. ° °This website has more information on Eliquis® (apixaban): http://www.eliquis.com/eliquis/home ° °

## 2017-11-24 LAB — COMPREHENSIVE METABOLIC PANEL
ALK PHOS: 120 U/L (ref 38–126)
ALT: 93 U/L — AB (ref 14–54)
ANION GAP: 10 (ref 5–15)
AST: 65 U/L — ABNORMAL HIGH (ref 15–41)
Albumin: 2.9 g/dL — ABNORMAL LOW (ref 3.5–5.0)
BILIRUBIN TOTAL: 0.8 mg/dL (ref 0.3–1.2)
BUN: 9 mg/dL (ref 6–20)
CALCIUM: 9 mg/dL (ref 8.9–10.3)
CO2: 27 mmol/L (ref 22–32)
CREATININE: 0.86 mg/dL (ref 0.44–1.00)
Chloride: 89 mmol/L — ABNORMAL LOW (ref 101–111)
Glucose, Bld: 134 mg/dL — ABNORMAL HIGH (ref 65–99)
Potassium: 3.5 mmol/L (ref 3.5–5.1)
SODIUM: 126 mmol/L — AB (ref 135–145)
TOTAL PROTEIN: 6.1 g/dL — AB (ref 6.5–8.1)

## 2017-11-24 LAB — BASIC METABOLIC PANEL
Anion gap: 11 (ref 5–15)
Anion gap: 10 (ref 5–15)
BUN: 12 mg/dL (ref 6–20)
BUN: 12 mg/dL (ref 6–20)
CALCIUM: 9.1 mg/dL (ref 8.9–10.3)
CALCIUM: 8.9 mg/dL (ref 8.9–10.3)
CO2: 25 mmol/L (ref 22–32)
CO2: 26 mmol/L (ref 22–32)
CREATININE: 0.88 mg/dL (ref 0.44–1.00)
CREATININE: 0.84 mg/dL (ref 0.44–1.00)
Chloride: 91 mmol/L — ABNORMAL LOW (ref 101–111)
Chloride: 89 mmol/L — ABNORMAL LOW (ref 101–111)
GFR calc Af Amer: >60 mL/min (ref >60)
GFR calc Af Amer: >60 mL/min (ref >60)
GLUCOSE: 138 mg/dL — AB (ref 65–99)
Glucose, Bld: 156 mg/dL — ABNORMAL HIGH (ref 65–99)
POTASSIUM: 3.7 mmol/L (ref 3.5–5.1)
POTASSIUM: 3.9 mmol/L (ref 3.5–5.1)
SODIUM: 127 mmol/L — AB (ref 135–145)
Sodium: 125 mmol/L — ABNORMAL LOW (ref 135–145)

## 2017-11-24 LAB — CBC
HCT: 33.6 % — ABNORMAL LOW (ref 36.0–46.0)
HEMOGLOBIN: 11.4 g/dL — AB (ref 12.0–15.0)
MCH: 30 pg (ref 26.0–34.0)
MCHC: 33.9 g/dL (ref 30.0–36.0)
MCV: 88.4 fL (ref 78.0–100.0)
Platelets: 326 10*3/uL (ref 150–400)
RBC: 3.8 MIL/uL — AB (ref 3.87–5.11)
RDW: 14 % (ref 11.5–15.5)
WBC: 8.5 10*3/uL (ref 4.0–10.5)

## 2017-11-24 LAB — CREATININE, URINE, RANDOM: CREATININE, URINE: 35.89 mg/dL

## 2017-11-24 LAB — SODIUM, URINE, RANDOM: SODIUM UR: 19 mmol/L

## 2017-11-24 NOTE — Progress Notes (Signed)
Physical Therapy Treatment Patient Details Name: Olivia [name] MRN: [id number] DOB: [birth year] Today's Date: 11/24/2017    History of Present Illness Pt is a 67 y.o. female s/p R TKA On 11/22/17. PMH includes HTN, OSA, depression, anxiety.    PT Comments    Patient motivated to work with PT this morning. Session focusing on improving functional independence with bed mobility, transfers and gait. Much improved bed mobility today with only supervision and VC for sequencing requiring no physical assist. Does require Mod A for sit to stand from bed, but likely to improve as patient was confused if she was allowed to place weight through LE. Gait within room around bed to recliner with Min Guard/Min A for safety and obstacle navigation with RW. Will continue to follow acutely to maximize functional mobility prior to d/c.     Follow Up Recommendations  SNF;Supervision for mobility/OOB     Equipment Recommendations  Rolling walker with 5" wheels    Recommendations for Other Services       Precautions / Restrictions Precautions Precautions: Knee Precaution Comments: Verbally reviewed precautions Required Braces or Orthoses: Knee Immobilizer - Right Restrictions Weight Bearing Restrictions: Yes RLE Weight Bearing: Weight bearing as tolerated    Mobility  Bed Mobility Overal bed mobility: Needs Assistance Bed Mobility: Supine to Sit     Supine to sit: HOB elevated;Supervision     General bed mobility comments: much improved bed mobility - came to EOB with very little VC, no physical assist needed from PT  Transfers Overall transfer level: Needs assistance Equipment used: Rolling walker (2 wheeled) Transfers: Sit to/from Stand Sit to Stand: Mod assist         General transfer comment: Mod A with VC for weight through operative LE; pateitn stating she was confused if she was allowed to palce weight through LE; once in standing, only Min Guard for safety.    Ambulation/Gait Ambulation/Gait assistance: Min guard;Min assist Ambulation Distance (Feet): (within room) Assistive device: Rolling walker (2 wheeled) Gait Pattern/deviations: Step-to pattern;Decreased stride length;Decreased weight shift to right;Antalgic   Gait velocity interpretation: Below normal speed for age/gender General Gait Details: Min Guard to [employer] A for sequencing with RW; Some assist needed for obstacle navigation to reduce fall risk.   Stairs            Wheelchair Mobility    Modified Rankin (Stroke Patients Only)       Balance Overall balance assessment: Needs assistance Sitting-balance support: No upper extremity supported;Feet supported Sitting balance-Leahy Scale: Fair     Standing balance support: Bilateral upper extremity supported;During functional activity Standing balance-Leahy Scale: Fair Standing balance comment: heavy use of RW                            Cognition Arousal/Alertness: Awake/alert Behavior During Therapy: WFL for tasks assessed/performed Overall Cognitive Status: Within Functional Limits for tasks assessed                                        Exercises      General Comments        Pertinent Vitals/Pain Pain Assessment: 0-10 Pain Score: 6  Pain Location: R knee Pain Descriptors / Indicators: Grimacing;Guarding Pain Intervention(s): Limited activity within patient's tolerance;Monitored during session;Repositioned    Home Living  Prior Function            PT Goals (current goals can now be found in the care plan section) Acute Rehab PT Goals Patient Stated Goal: Decreased pain and nausea PT Goal Formulation: With patient Time For Goal Achievement: 12/06/17 Potential to Achieve Goals: Good Progress towards PT goals: Progressing toward goals    Frequency    7X/week      PT Plan Current plan remains appropriate    Co-evaluation               AM-PAC PT "6 Clicks" Daily Activity  Outcome Measure  Difficulty turning over in bed (including adjusting bedclothes, sheets and blankets)?: A Little Difficulty moving from lying on back to sitting on the side of the bed? : A Little Difficulty sitting down on and standing up from a chair with arms (e.g., wheelchair, bedside commode, etc,.)?: Unable Help needed moving to and from a bed to chair (including a wheelchair)?: A Little Help needed walking in hospital room?: A Little Help needed climbing 3-5 steps with a railing? : A Lot 6 Click Score: 15    End of Session Equipment Utilized During Treatment: Gait belt Activity Tolerance: Patient tolerated treatment well Patient left: in chair;with call bell/phone within reach;with family/visitor present Nurse Communication: Mobility status PT Visit Diagnosis: Other abnormalities of gait and mobility (R26.89);Pain Pain - Right/Left: Right Pain - part of body: Knee     Time: 1040-1102 PT Time Calculation (min) (ACUTE ONLY): 22 min  Charges:  $Gait Training: 8-22 mins                    G Codes:       Lanney Gins, PT, DPT 11/24/17 11:39 AM

## 2017-11-24 NOTE — Progress Notes (Signed)
Orthopedic Tech Progress Note Patient Details:  Olivia [name] 1951-05-17 [id number]  CPM Right Knee CPM Right Knee: On Right Knee Flexion (Degrees): 65 Right Knee Extension (Degrees): 0 Additional Comments: trapeze bar  Post Interventions Patient Tolerated: Well Instructions Provided: Care of device  [name] 11/24/2017, 3:10 PM

## 2017-11-24 NOTE — Progress Notes (Signed)
Orthopedic Trauma Service Progress Note   Patient ID: Olivia [name] MRN: [id number] DOB/AGE: [birth year] 67 y.o.  Subjective:  Doing fine  Feels better today than yesterday Slightly nauseated but this is fairly common for her   States she has long standing issues with low sodium   Appetite ok No BM yet Using urine wick    ROS As above  Objective:   VITALS:   Vitals:   11/23/17 2357 11/24/17 0000 11/24/17 0443 11/24/17 0920  BP:   124/79 128/76  Pulse:   79 76  Resp:  18 17   Temp:   98.4 F (36.9 C)   TempSrc:   Oral   SpO2: 96%  96%   Weight:      Height:        Estimated body mass index is 40.41 kg/m as calculated from the following:   Height as of this encounter: 5\' 7"  (1.702 m).   Weight as of this encounter: 117 kg (258 lb).   Intake/Output      02/23 0701 - 02/24 0700 02/24 0701 - 02/25 0700   P.O. 1600 360   I.V. (mL/kg)     IV Piggyback     Total Intake(mL/kg) 1600 (13.7) 360 (3.1)   Urine (mL/kg/hr) 1400 (0.5) 600 (1.6)   Blood     Total Output 1400 600   Net +200 -240        Urine Occurrence 1 x      LABS  Results for orders placed or performed during the hospital encounter of 11/22/17 (from the past 24 hour(s))  Osmolality     Status: Abnormal   Collection Time: 11/23/17 10:33 AM  Result Value Ref Range   Osmolality 264 (L) 275 - 295 mOsm/kg  CBC     Status: Abnormal   Collection Time: 11/24/17  4:03 AM  Result Value Ref Range   WBC 8.5 4.0 - 10.5 K/uL   RBC 3.80 (L) 3.87 - 5.11 MIL/uL   Hemoglobin 11.4 (L) 12.0 - 15.0 g/dL   HCT 33.6 (L) 36.0 - 46.0 %   MCV 88.4 78.0 - 100.0 fL   MCH 30.0 26.0 - 34.0 pg   MCHC 33.9 30.0 - 36.0 g/dL   RDW 14.0 11.5 - 15.5 %   Platelets 326 150 - 400 K/uL  Comprehensive metabolic panel     Status: Abnormal   Collection Time: 11/24/17  4:03 AM  Result Value Ref Range   Sodium 126 (L) 135 - 145 mmol/L   Potassium 3.5 3.5 - 5.1 mmol/L   Chloride 89 (L) 101 - 111 mmol/L   CO2 27 22 - 32 mmol/L   Glucose, Bld 134 (H) 65 - 99 mg/dL   BUN 9 6 - 20 mg/dL   Creatinine, Ser 0.86 0.44 - 1.00 mg/dL   Calcium 9.0 8.9 - 10.3 mg/dL   Total Protein 6.1 (L) 6.5 - 8.1 g/dL   Albumin 2.9 (L) 3.5 - 5.0 g/dL   AST 65 (H) 15 - 41 U/L   ALT 93 (H) 14 - 54 U/L   Alkaline Phosphatase 120 38 - 126 U/L   Total Bilirubin 0.8 0.3 - 1.2 mg/dL   GFR calc non Af Amer >60 >60 mL/min   GFR calc Af Amer >60 >60 mL/min   Anion gap 10 5 - 15     PHYSICAL EXAM:   Gen: in bed, NAD, much more awake today  Lungs: Clear to auscultation bilaterally, no wheezes or rhonchi noted Cardiac:  Regular rate and rhythm, S1 and S2 Abd: Soft, nontender, nondistended,  + bowel sounds Ext:       Right lower extremity             Dressing removed  Incision looks great   No drainage   No signs of infection   Mild ecchymosis              Moderate swelling distally but unchanged from yesterday              EHL, FHL, lesser toe motor functions are intact             Ankle flexion, extension, inversion and eversion are intact             Patient can perform a quad set             DPN, SPN, TN sensory functions are grossly intact             Extremity is warm             + DP pulse             No pain with passive stretching of her lower leg compartments   Assessment/Plan: 2 Days Post-Op   Principal Problem:   Primary localized osteoarthritis of right knee Active Problems:   Hyponatremia   Hypothyroidism   Hypertension   Hyperlipidemia   GERD (gastroesophageal reflux disease)   Depression   Anxiety   Anti-infectives (From admission, onward)   Start     Dose/Rate Route Frequency Ordered Stop   11/22/17 1800  vancomycin (VANCOCIN) IVPB 1000 mg/200 mL premix     1,000 mg 200 mL/hr over 60 Minutes Intravenous Every 12 hours 11/22/17 1206 11/22/17 1823   11/22/17 0715  vancomycin (VANCOCIN) 1,500 mg in sodium chloride 0.9 % 500 mL IVPB     1,500 mg 250 mL/hr over 120 Minutes Intravenous  To Surgery 11/21/17 1127 11/22/17 0805    .  POD/HD#: 8  67 year old female status post right total knee arthroplasty for end-stage DJD right knee    -End-stage DJD of the right knee s/p right total knee arthroplasty             Weight-bear as tolerated             CPM             PT and OT              Dressing changed today, dressing changes as needed              Encourage bone foam use when in bed             Ice and elevate             Toe and ankle motion to help with swelling control as well               Hopeful for skilled nursing facility on Monday   - Pain management:            continue with current regimen   - ABL anemia/Hemodynamics             Stable              - Medical issues              Home meds               Hyponatremia  Hold HCTZ/lisinopril                         serum osmolality is low   Dc IVF   Recheck BMET now   Pt asymptomatic other than nausea which could be due to any number of things    Will get medicine consult if it drops any lower    - DVT/PE prophylaxis:             eliquis    - ID:              periop abx completed      - Activity:             Up with therapy    - FEN/GI prophylaxis/Foley/Lines:             Reg diet             protonix    - Dispo:             Therapies              SNF Monday         Jari Pigg, PA-C [facility] [phone] Levi Aland (C) 11/24/2017, 10:13 AM

## 2017-11-24 NOTE — NC FL2 (Addendum)
[place] MEDICAID FL2 LEVEL OF CARE SCREENING TOOL     IDENTIFICATION  Patient Name: Olivia [name] Birthdate: 10/08/1950 Sex: female Admission Date (Current Location): 11/22/2017  [facility] and [place] Number:  Herbalist and Address:  [facility], [place] [address] [place], [place] [address]      Provider Number: [id number]  Attending Physician Name and Address:  Earlie Server, MD  Relative Name and Phone Number:       Current Level of Care: Hospital Recommended Level of Care: [place] Prior Approval Number:    Date Approved/Denied:   PASRR Number: pending  Discharge Plan: SNF    Current Diagnoses: Patient Active Problem List   Diagnosis Date Noted  . Hyponatremia 11/23/2017  . Hypothyroidism   . Hypertension   . Hyperlipidemia   . GERD (gastroesophageal reflux disease)   . Depression   . Anxiety   . Primary localized osteoarthritis of right knee 11/22/2017  . Varicose veins of leg with complications 30/06/2329  . Varicose veins of lower extremities with other complications 07/62/2633  . Swelling of limb-Right Leg 06/16/2014  . Pain of right lower extremity 06/16/2014    Orientation RESPIRATION BLADDER Height & Weight     Time, Situation, Place, Self  Normal Continent Weight: 258 lb (117 kg) Height:  5\' 7"  (170.2 cm)  BEHAVIORAL SYMPTOMS/MOOD NEUROLOGICAL BOWEL NUTRITION STATUS      Continent (Please see d/c summary)  AMBULATORY STATUS COMMUNICATION OF NEEDS Skin   Limited Assist Verbally Surgical wounds(Closed incision right knee, compression wrapped)                       Personal Care Assistance Level of Assistance  Bathing, Dressing, Feeding Bathing Assistance: Limited assistance Feeding assistance: Independent Dressing Assistance: Limited assistance     Functional Limitations Info  Sight, Hearing, Speech Sight Info: Adequate Hearing Info: Adequate Speech Info: Adequate    SPECIAL CARE  FACTORS FREQUENCY  PT (By licensed PT), OT (By licensed OT)     PT Frequency: 7x week OT Frequency: 7x week            Contractures Contractures Info: Not present    Additional Factors Info  Code Status, Allergies Code Status Info: Full Code Allergies Info: Demerol Meperidine, Penicillins           Current Medications (11/24/2017):  This is the current hospital active medication list Current Facility-Administered Medications  Medication Dose Route Frequency Provider Last Rate Last Dose  . acetaminophen (TYLENOL) tablet 1,000 mg  1,000 mg Oral Q6H Ainsley Spinner, PA-C   1,000 mg at 11/24/17 1212  . apixaban (ELIQUIS) tablet 2.5 mg  2.5 mg Oral Q12H Chadwell, Joshua, PA-C   2.5 mg at 11/24/17 3545  . atenolol (TENORMIN) tablet 25 mg  25 mg Oral QAC breakfast Chadwell, Joshua, PA-C   25 mg at 11/24/17 6256  . bupivacaine liposome (EXPAREL) 1.3 % injection 266 mg  20 mL Infiltration Once Earlie Server, MD      . busPIRone (BUSPAR) tablet 30 mg  30 mg Oral BID Chadwell, Joshua, PA-C   30 mg at 11/24/17 0931  . calcium-vitamin D (OSCAL WITH D) 500-200 MG-UNIT per tablet 1 tablet  1 tablet Oral Daily Chadwell, Joshua, PA-C   1 tablet at 11/24/17 3893  . celecoxib (CELEBREX) capsule 200 mg  200 mg Oral BID Ainsley Spinner, PA-C   200 mg at 11/24/17 7342  . clonazePAM (KLONOPIN) tablet 1  mg  1 mg Oral TID Earlie Server, MD   1 mg at 11/24/17 1211   And  . clonazePAM (KLONOPIN) tablet 0.5 mg  0.5 mg Oral Q24H Earlie Server, MD   0.5 mg at 11/23/17 1912  . docusate sodium (COLACE) capsule 100 mg  100 mg Oral BID Chadwell, Joshua, PA-C   100 mg at 11/24/17 5093  . DULoxetine (CYMBALTA) DR capsule 120 mg  120 mg Oral Daily Chadwell, Joshua, PA-C   120 mg at 11/24/17 2671  . HYDROmorphone (DILAUDID) injection 0.5 mg  0.5 mg Intravenous Q4H PRN Ainsley Spinner, PA-C   0.5 mg at 11/24/17 0047  . lactated ringers infusion   Intravenous Continuous Lyn Hollingshead, MD 10 mL/hr at 11/22/17 [phone]    .  levothyroxine (SYNTHROID, LEVOTHROID) tablet 125 mcg  125 mcg Oral QAC breakfast Chriss Czar, PA-C   125 mcg at 11/24/17 0998  . menthol-cetylpyridinium (CEPACOL) lozenge 3 mg  1 lozenge Oral PRN Chadwell, Joshua, PA-C       Or  . phenol (CHLORASEPTIC) mouth spray 1 spray  1 spray Mouth/Throat PRN Chadwell, Joshua, PA-C      . methocarbamol (ROBAXIN) tablet 500 mg  500 mg Oral QID PRN Chadwell, Joshua, PA-C   500 mg at 11/24/17 0920  . metoCLOPramide (REGLAN) tablet 5-10 mg  5-10 mg Oral Q8H PRN Chadwell, Joshua, PA-C       Or  . metoCLOPramide (REGLAN) injection 5-10 mg  5-10 mg Intravenous Q8H PRN Chadwell, Joshua, PA-C   5 mg at 11/23/17 2030  . mirtazapine (REMERON) tablet 30 mg  30 mg Oral QHS Chadwell, Joshua, PA-C   30 mg at 11/23/17 2231  . multivitamin with minerals tablet 1 tablet  1 tablet Oral Daily Chadwell, Joshua, PA-C   1 tablet at 11/24/17 3382  . ondansetron (ZOFRAN) tablet 4 mg  4 mg Oral Q6H PRN Chadwell, Joshua, PA-C       Or  . ondansetron (ZOFRAN) injection 4 mg  4 mg Intravenous Q6H PRN Chadwell, Joshua, PA-C   4 mg at 11/23/17 0757  . oxyCODONE (Oxy IR/ROXICODONE) immediate release tablet 10 mg  10 mg Oral Q3H PRN Chadwell, Joshua, PA-C   10 mg at 11/24/17 1212  . oxyCODONE (Oxy IR/ROXICODONE) immediate release tablet 5 mg  5 mg Oral Q3H PRN Chadwell, Joshua, PA-C   5 mg at 11/22/17 2222  . pantoprazole (PROTONIX) EC tablet 80 mg  80 mg Oral Q1200 Chadwell, Joshua, PA-C   80 mg at 11/24/17 5053  . pregabalin (LYRICA) capsule 75 mg  75 mg Oral BID Chadwell, Joshua, PA-C   75 mg at 11/24/17 9767  . senna-docusate (Senokot-S) tablet 1 tablet  1 tablet Oral QHS PRN Chadwell, Joshua, PA-C      . sodium phosphate (FLEET) 7-19 GM/118ML enema 1 enema  1 enema Rectal Once PRN Chadwell, Joshua, PA-C      . sorbitol 70 % solution 30 mL  30 mL Oral Daily PRN Chadwell, Joshua, PA-C      . vitamin C (ASCORBIC ACID) tablet 500 mg  500 mg Oral Daily Chadwell, Joshua, PA-C   500 mg  at 11/24/17 3419     Discharge Medications: Please see discharge summary for a list of discharge medications.  Relevant Imaging Results:  Relevant Lab Results:   Additional Information [phone]  Gerrianne Scale Hadia Minier, LCSW

## 2017-11-24 NOTE — Clinical Social Work Note (Signed)
Clinical Social Work Assessment  Patient Details  Name: Olivia [name] MRN: [id number] Date of Birth: 1951/03/25  Date of referral:  11/24/17               Reason for consult:  Facility Placement                Permission sought to share information with:  Family Supports Permission granted to share information::  Yes, Release of Information Signed  Name::     Doctor, hospital::     Relationship::  Son  Sport and exercise psychologist Information:     Housing/Transportation Living arrangements for the past 2 months:  Single Family Home Source of Information:  Patient Patient Interpreter Needed:    Criminal Activity/Legal Involvement Pertinent to Current Situation/Hospitalization:  No - Comment as needed Significant Relationships:  Adult Children Lives with:  Adult Children(Son) Do you feel safe going back to the place where you live?    Need for family participation in patient care:     Care giving concerns:  Pt lived independently with adult son prior to admission.   Social Worker assessment / plan:  CSW spoke with pt. Pt is agreeable to SNF at d/c. Pt states her caseworker from the physicians office recommended 1) [facility] 2) [facility] 3) [facility]. Pt is agreeable to be faxed out to these three facilties.   Employment status:    Insurance information:  Medicare PT Recommendations:  [place] / Referral to community resources:  [place]  Patient/Family's Response to care:  Pt verbalized understanding of CSW role and expressed appreciation for support. Pt denies any concern regarding pt care at this time.   Patient/Family's Understanding of and Emotional Response to Diagnosis, Current Treatment, and Prognosis:  Pt understanding and realistic regarding physical limitations. Pt understands the need for SNF placement at d/c. Pt agreeable to SNF placement at d/c, at this time. Pt's responses emotionally appropriate during conversation with CSW. Pt denies any concern  regarding treatment plan at this time. CSW will continue to provide support and facilitate d/c needs.   Emotional Assessment Appearance:  Appears stated age Attitude/Demeanor/Rapport:  (Patient was appropriate) Affect (typically observed):  Accepting, Appropriate, Calm Orientation:  Oriented to Self, Oriented to Place, Oriented to  Time, Oriented to Situation Alcohol / Substance use:  Not Applicable Psych involvement (Current and /or in the community):  No (Comment)  Discharge Needs  Concerns to be addressed:  Basic Needs, Care Coordination Readmission within the last 30 days:  No Current discharge risk:  Dependent with Mobility Barriers to Discharge:  Continued Medical Work up   [employer], LCSW 11/24/2017, 3:37 PM

## 2017-11-25 ENCOUNTER — Encounter (HOSPITAL_COMMUNITY): Payer: Self-pay | Admitting: Orthopedic Surgery

## 2017-11-25 ENCOUNTER — Ambulatory Visit: Payer: Medicare Other | Admitting: Neurology

## 2017-11-25 DIAGNOSIS — M1711 Unilateral primary osteoarthritis, right knee: Secondary | ICD-10-CM | POA: Diagnosis not present

## 2017-11-25 DIAGNOSIS — M179 Osteoarthritis of knee, unspecified: Secondary | ICD-10-CM | POA: Diagnosis not present

## 2017-11-25 DIAGNOSIS — Z23 Encounter for immunization: Secondary | ICD-10-CM | POA: Diagnosis not present

## 2017-11-25 DIAGNOSIS — K5790 Diverticulosis of intestine, part unspecified, without perforation or abscess without bleeding: Secondary | ICD-10-CM | POA: Diagnosis not present

## 2017-11-25 DIAGNOSIS — G8911 Acute pain due to trauma: Secondary | ICD-10-CM | POA: Diagnosis not present

## 2017-11-25 DIAGNOSIS — B370 Candidal stomatitis: Secondary | ICD-10-CM | POA: Diagnosis not present

## 2017-11-25 DIAGNOSIS — E785 Hyperlipidemia, unspecified: Secondary | ICD-10-CM | POA: Diagnosis not present

## 2017-11-25 DIAGNOSIS — E039 Hypothyroidism, unspecified: Secondary | ICD-10-CM | POA: Diagnosis not present

## 2017-11-25 DIAGNOSIS — K5900 Constipation, unspecified: Secondary | ICD-10-CM | POA: Diagnosis not present

## 2017-11-25 DIAGNOSIS — I10 Essential (primary) hypertension: Secondary | ICD-10-CM | POA: Diagnosis not present

## 2017-11-25 DIAGNOSIS — H04129 Dry eye syndrome of unspecified lacrimal gland: Secondary | ICD-10-CM | POA: Diagnosis not present

## 2017-11-25 DIAGNOSIS — M1990 Unspecified osteoarthritis, unspecified site: Secondary | ICD-10-CM | POA: Diagnosis not present

## 2017-11-25 DIAGNOSIS — G4733 Obstructive sleep apnea (adult) (pediatric): Secondary | ICD-10-CM | POA: Diagnosis not present

## 2017-11-25 DIAGNOSIS — S8002XA Contusion of left knee, initial encounter: Secondary | ICD-10-CM | POA: Diagnosis not present

## 2017-11-25 DIAGNOSIS — Z471 Aftercare following joint replacement surgery: Secondary | ICD-10-CM | POA: Diagnosis not present

## 2017-11-25 DIAGNOSIS — F419 Anxiety disorder, unspecified: Secondary | ICD-10-CM | POA: Diagnosis not present

## 2017-11-25 DIAGNOSIS — Z96651 Presence of right artificial knee joint: Secondary | ICD-10-CM | POA: Diagnosis not present

## 2017-11-25 DIAGNOSIS — F418 Other specified anxiety disorders: Secondary | ICD-10-CM | POA: Diagnosis not present

## 2017-11-25 DIAGNOSIS — K219 Gastro-esophageal reflux disease without esophagitis: Secondary | ICD-10-CM | POA: Diagnosis not present

## 2017-11-25 LAB — BASIC METABOLIC PANEL
Anion gap: 12 (ref 5–15)
BUN: 12 mg/dL (ref 6–20)
CALCIUM: 9.4 mg/dL (ref 8.9–10.3)
CHLORIDE: 90 mmol/L — AB (ref 101–111)
CO2: 27 mmol/L (ref 22–32)
CREATININE: 0.87 mg/dL (ref 0.44–1.00)
GFR calc Af Amer: >60 mL/min (ref >60)
GFR calc non Af Amer: >60 mL/min (ref >60)
GLUCOSE: 120 mg/dL — AB (ref 65–99)
Potassium: 3.7 mmol/L (ref 3.5–5.1)
Sodium: 129 mmol/L — ABNORMAL LOW (ref 135–145)

## 2017-11-25 LAB — CBC
HCT: 32.7 % — ABNORMAL LOW (ref 36.0–46.0)
Hemoglobin: 11 g/dL — ABNORMAL LOW (ref 12.0–15.0)
MCH: 29.7 pg (ref 26.0–34.0)
MCHC: 33.6 g/dL (ref 30.0–36.0)
MCV: 88.4 fL (ref 78.0–100.0)
Platelets: 309 10*3/uL (ref 150–400)
RBC: 3.7 MIL/uL — ABNORMAL LOW (ref 3.87–5.11)
RDW: 13.9 % (ref 11.5–15.5)
WBC: 8.4 10*3/uL (ref 4.0–10.5)

## 2017-11-25 MED ORDER — INFLUENZA VAC SPLIT HIGH-DOSE 0.5 ML IM SUSY: 0.5000 mL | PREFILLED_SYRINGE | INTRAMUSCULAR | Status: DC

## 2017-11-25 MED ORDER — INFLUENZA VAC SPLIT HIGH-DOSE 0.5 ML IM SUSY
0.5000 mL | PREFILLED_SYRINGE | Freq: Once | INTRAMUSCULAR | Status: AC
  Administered 2017-11-25: 0.5 mL via INTRAMUSCULAR
  Filled 2017-11-25: qty 0.5

## 2017-11-25 MED ORDER — ACETAMINOPHEN 500 MG PO TABS: 1000.0000 mg | ORAL_TABLET | Freq: Four times a day (QID) | ORAL | 0 refills | Status: AC | PRN

## 2017-11-25 MED ORDER — CELECOXIB 200 MG PO CAPS: 200.0000 mg | ORAL_CAPSULE | Freq: Two times a day (BID) | ORAL | 0 refills | Status: DC

## 2017-11-25 NOTE — Progress Notes (Signed)
Physical Therapy Treatment Patient Details Name: Olivia [name] MRN: [id number] DOB: Aug 30, 1951 Today's Date: 11/25/2017    History of Present Illness Pt is a 67 y.o. female s/p R TKA On 11/22/17. PMH includes HTN, OSA, depression, anxiety.    PT Comments    Patient is making progress toward mobility goals. Continue to progress as tolerated with anticipated d/c to SNF for further skilled PT services.     Follow Up Recommendations  SNF;Supervision for mobility/OOB     Equipment Recommendations  Rolling walker with 5" wheels    Recommendations for Other Services OT consult     Precautions / Restrictions Precautions Precautions: Knee Precaution Comments: knee precautions reviewed with pt Required Braces or Orthoses: Knee Immobilizer - Right Restrictions Weight Bearing Restrictions: Yes RLE Weight Bearing: Weight bearing as tolerated    Mobility  Bed Mobility Overal bed mobility: Needs Assistance Bed Mobility: Supine to Sit     Supine to sit: HOB elevated;Supervision     General bed mobility comments: use of rail and HOB elevated  Transfers Overall transfer level: Needs assistance Equipment used: Rolling walker (2 wheeled) Transfers: Sit to/from Stand Sit to Stand: Mod assist;Min assist         General transfer comment: mod A from EOB and min A from [facility]; cues for safe hand placement and use of AD; assist to power up and gain balance upon standing  Ambulation/Gait Ambulation/Gait assistance: Min guard;Min assist Ambulation Distance (Feet): (29ft then 53ft) Assistive device: Rolling walker (2 wheeled) Gait Pattern/deviations: Step-to pattern;Decreased weight shift to right;Antalgic;Step-through pattern;Decreased stance time - right;Decreased step length - left;Trunk flexed     General Gait Details: cues for sequencing, R heel strike/toe off, posture, and step length symmetry; pt relies heavily on UE support   Stairs            Wheelchair Mobility     Modified Rankin (Stroke Patients Only)       Balance Overall balance assessment: Needs assistance Sitting-balance support: No upper extremity supported;Feet supported Sitting balance-Leahy Scale: Good     Standing balance support: Bilateral upper extremity supported;During functional activity Standing balance-Leahy Scale: Poor                              Cognition Arousal/Alertness: Awake/alert Behavior During Therapy: WFL for tasks assessed/performed Overall Cognitive Status: Within Functional Limits for tasks assessed                                        Exercises Total Joint Exercises Ankle Circles/Pumps: AROM;Both;10 reps Quad Sets: AROM;Both;10 reps Heel Slides: AAROM;Right;10 reps    General Comments        Pertinent Vitals/Pain Pain Assessment: Faces Faces Pain Scale: Hurts little more Pain Location: R knee Pain Descriptors / Indicators: Guarding;Sore Pain Intervention(s): Limited activity within patient's tolerance;Monitored during session;Repositioned;Premedicated before session    Home Living                      Prior Function            PT Goals (current goals can now be found in the care plan section) Acute Rehab PT Goals PT Goal Formulation: With patient Time For Goal Achievement: 12/06/17 Potential to Achieve Goals: Good Progress towards PT goals: Progressing toward goals    Frequency    7X/week  PT Plan Current plan remains appropriate    Co-evaluation              AM-PAC PT "6 Clicks" Daily Activity  Outcome Measure  Difficulty turning over in bed (including adjusting bedclothes, sheets and blankets)?: A Little Difficulty moving from lying on back to sitting on the side of the bed? : A Lot Difficulty sitting down on and standing up from a chair with arms (e.g., wheelchair, bedside commode, etc,.)?: Unable Help needed moving to and from a bed to chair (including a wheelchair)?: A  Little Help needed walking in hospital room?: A Little Help needed climbing 3-5 steps with a railing? : A Lot 6 Click Score: 14    End of Session Equipment Utilized During Treatment: Gait belt Activity Tolerance: Patient tolerated treatment well Patient left: in chair;with call bell/phone within reach Nurse Communication: Mobility status PT Visit Diagnosis: Other abnormalities of gait and mobility (R26.89);Pain Pain - Right/Left: Right Pain - part of body: Knee     Time: 5277-8242 PT Time Calculation (min) (ACUTE ONLY): 35 min  Charges:  $Gait Training: 8-22 mins $Therapeutic Activity: 8-22 mins                    G Codes:       Earney Navy, PTA Pager: [phone]     Darliss Cheney 11/25/2017, 1:52 PM

## 2017-11-25 NOTE — Progress Notes (Signed)
Pt has an ingrown toenail on her right large toe. She states she had "some work done on it a week before surgery". Tissue around toe is red. No pus noted.

## 2017-11-25 NOTE — Progress Notes (Signed)
Subjective: 3 Days Post-Op Procedure(s) (LRB): RIGHT TOTAL KNEE ARTHROPLASTY (Right) Patient reports pain as mild.    Objective: Vital signs in last 24 hours: Temp:  [97.9 F (36.6 C)-99.6 F (37.6 C)] 97.9 F (36.6 C) (02/25 0342) Pulse Rate:  [76-90] 90 (02/25 0342) Resp:  [17-18] 18 (02/25 0342) BP: (118-146)/(62-83) 146/83 (02/25 0342) SpO2:  [96 %-97 %] 97 % (02/25 0342)  Intake/Output from previous day: 02/24 0701 - 02/25 0700 In: 1520 [P.O.:1520] Out: 2525 [Urine:2525] Intake/Output this shift: No intake/output data recorded.  Recent Labs    11/23/17 0515 11/24/17 0403 11/25/17 0455  HGB 11.5* 11.4* 11.0*   Recent Labs    11/24/17 0403 11/25/17 0455  WBC 8.5 8.4  RBC 3.80* 3.70*  HCT 33.6* 32.7*  PLT 326 309   Recent Labs    11/24/17 1544 11/25/17 0455  NA 125* 129*  K 3.9 3.7  CL 89* 90*  CO2 26 27  BUN 12 12  CREATININE 0.84 0.87  GLUCOSE 156* 120*  CALCIUM 8.9 9.4   No results for input(s): LABPT, INR in the last 72 hours.  Neurovascular intact Sensation intact distally Intact pulses distally Dorsiflexion/Plantar flexion intact Incision: dressing C/D/I and scant drainage No cellulitis present  Assessment/Plan: 3 Days Post-Op Procedure(s) (LRB): RIGHT TOTAL KNEE ARTHROPLASTY (Right) Up with therapy Discharge to SNF  [facility] 11/25/2017, 8:41 AM

## 2017-11-25 NOTE — Discharge Summary (Signed)
PATIENT ID: Olivia [name]        MRN:  [id number]          DOB/AGE: 1951-04-05 / 67 y.o.    DISCHARGE SUMMARY  ADMISSION DATE:    11/22/2017 DISCHARGE DATE:   11/25/2017   ADMISSION DIAGNOSIS: OA RIGHT KNEE    DISCHARGE DIAGNOSIS:  OA RIGHT KNEE    ADDITIONAL DIAGNOSIS: Principal Problem:   Primary localized osteoarthritis of right knee Active Problems:   Hyponatremia   Hypothyroidism   Hypertension   Hyperlipidemia   GERD (gastroesophageal reflux disease)   Depression   Anxiety  Past Medical History:  Diagnosis Date  . Anxiety    [facility]  . Arthritis   . Cataract    THESE BEEN REPLACED  . Depression    [facility]  . Diverticulosis   . Dry eye syndrome   . GERD (gastroesophageal reflux disease)   . History of colon polyps 12/03/2012   COLONOSCOPY  . History of exercise stress test 2010   Dr. Ashok Norris   . Hyperlipidemia   . Hypertension   . Hypothyroidism   . Multinodular goiter    DR. KERR  . OSA (obstructive sleep apnea)    (PSG 12/12/15 ESS 2, AHI 42/HR REM 30/HR, O2 MIN 80%)  CPAP- q night , done with [facility].  [facility] done at [facility]  . Squamous cell carcinoma in situ 2015  . Thyroid disease    Nodules  . Tubular adenoma    DR. [place]  . Varicose veins     PROCEDURE: Procedure(s): RIGHT TOTAL KNEE ARTHROPLASTY Right on 11/22/2017  CONSULTS: PT/OT/SW    HISTORY:  See H&P in chart  HOSPITAL COURSE:  Olivia [name] is a 67 y.o. admitted on 11/22/2017 and found to have a diagnosis of OA RIGHT KNEE.  After appropriate laboratory studies were obtained  they were taken to the operating room on 11/22/2017 and underwent  Procedure(s): RIGHT TOTAL KNEE ARTHROPLASTY  Right.   They were given perioperative antibiotics:  Anti-infectives (From admission, onward)   Start     Dose/Rate Route Frequency Ordered Stop   11/22/17 1800  vancomycin (VANCOCIN) IVPB 1000 mg/200 mL premix     1,000 mg 200 mL/hr over 60 Minutes Intravenous  Every 12 hours 11/22/17 1206 11/22/17 1823   11/22/17 0715  vancomycin (VANCOCIN) 1,500 mg in sodium chloride 0.9 % 500 mL IVPB     1,500 mg 250 mL/hr over 120 Minutes Intravenous To Surgery 11/21/17 1127 11/22/17 0805    .  Tolerated the procedure well.   POD #1, allowed out of bed to a chair.  PT for ambulation and exercise program.   IV saline locked.  O2 discontionued.  POD #2, continued PT and ambulation.  .  The remainder of the hospital course was dedicated to ambulation and strengthening.   The patient was discharged on 3 Days Post-Op in  Stable condition.  Blood products given:none  DIAGNOSTIC STUDIES: Recent vital signs:  Patient Vitals for the past 24 hrs:  BP Temp Temp src Pulse Resp SpO2  11/25/17 0342 (!) 146/83 97.9 F (36.6 C) Oral 90 18 97 %  11/25/17 0100 - - - - 18 -  11/25/17 0000 - - - - 18 -  11/24/17 2000 133/76 99.6 F (37.6 C) Oral - 18 96 %  11/24/17 1300 118/62 98.7 F (37.1 C) Oral 89 17 96 %  11/24/17 0920 128/76 - - 76 - -  Recent laboratory studies: Recent Labs    11/23/17 0515 11/24/17 0403 11/25/17 0455  WBC 9.4 8.5 8.4  HGB 11.5* 11.4* 11.0*  HCT 34.9* 33.6* 32.7*  PLT 326 326 309   Recent Labs    11/23/17 0515 11/24/17 0403 11/24/17 1023 11/24/17 1544 11/25/17 0455  NA 130* 126* 127* 125* 129*  K 4.6 3.5 3.7 3.9 3.7  CL 92* 89* 91* 89* 90*  CO2 27 27 25 26 27   BUN 6 9 12 12 12   CREATININE 0.62 0.86 0.88 0.84 0.87  GLUCOSE 130* 134* 138* 156* 120*  CALCIUM 9.0 9.0 9.1 8.9 9.4   Lab Results  Component Value Date   INR 0.97 11/11/2017     Recent Radiographic Studies :  Dg Chest 2 View  Result Date: 11/11/2017 CLINICAL DATA:  Preoperative chest radiograph, for knee replacement. EXAM: CHEST  2 VIEW COMPARISON:  Chest radiograph performed 02/04/2015 FINDINGS: The lungs are well-aerated. Minimal left basilar atelectasis is noted. There is no evidence of pleural effusion or pneumothorax. The heart is normal in size; the  mediastinal contour is within normal limits. No acute osseous abnormalities are seen. IMPRESSION: Minimal left basilar atelectasis.  Lungs otherwise clear. Electronically Signed   By: Garald Balding M.D.   On: 11/11/2017 23:16    DISCHARGE INSTRUCTIONS:   DISCHARGE MEDICATIONS:   Allergies as of 11/25/2017      Reactions   Demerol [meperidine] Nausea And Vomiting   Penicillins Other (See Comments)   Unknown childhood allergy Has patient had a PCN reaction causing immediate rash, facial/tongue/throat swelling, SOB or lightheadedness with hypotension: Unknown Has patient had a PCN reaction causing severe rash involving mucus membranes or skin necrosis: Unknown Has patient had a PCN reaction that required hospitalization: Unknown Has patient had a PCN reaction occurring within the last 10 years: No If all of the above answers are "NO", then may proceed with Cephalosporin use.      Medication List    STOP taking these medications   HYDROcodone-acetaminophen 5-325 MG tablet Commonly known as:  NORCO/VICODIN     TAKE these medications   acetaminophen 500 MG tablet Commonly known as:  TYLENOL Take 2 tablets (1,000 mg total) by mouth every 6 (six) hours as needed for mild pain or moderate pain.   apixaban 2.5 MG Tabs tablet Commonly known as:  ELIQUIS Take 1 tablet (2.5 mg total) by mouth 2 (two) times daily.   atenolol 25 MG tablet Commonly known as:  TENORMIN Take 25 mg by mouth daily before breakfast.   busPIRone 30 MG tablet Commonly known as:  BUSPAR Take 30 mg by mouth 2 (two) times daily.   calcium-vitamin D 500-200 MG-UNIT tablet Commonly known as:  OSCAL WITH D Take 1 tablet by mouth daily.   celecoxib 200 MG capsule Commonly known as:  CELEBREX Take 1 capsule (200 mg total) by mouth 2 (two) times daily.   clonazePAM 1 MG tablet Commonly known as:  KLONOPIN Take 0.5-1 mg by mouth See admin instructions. 1 mg 3 times daily , and 0.5 mg at 5:00p   DULoxetine 60 MG  capsule Commonly known as:  CYMBALTA Take 120 mg by mouth daily.   esomeprazole 40 MG capsule Commonly known as:  NEXIUM Take 40 mg by mouth 2 (two) times daily before a meal.   gentamicin cream 0.1 % Commonly known as:  GARAMYCIN Apply 1 application topically 3 (three) times daily.   lisinopril-hydrochlorothiazide 20-25 MG tablet Commonly known as:  PRINZIDE,ZESTORETIC Take  1 tablet by mouth daily.   methocarbamol 500 MG tablet Commonly known as:  ROBAXIN Take 500 mg by mouth 4 (four) times daily as needed. spasms   mirtazapine 30 MG tablet Commonly known as:  REMERON Take 30 mg by mouth at bedtime.   multivitamin with minerals Tabs tablet Take 1 tablet by mouth daily.   oxyCODONE 5 MG immediate release tablet Commonly known as:  Oxy IR/ROXICODONE 1-2 tabs po q4-6hrs prn pain   pregabalin 75 MG capsule Commonly known as:  LYRICA Take 1 capsule (75 mg total) by mouth 2 (two) times daily.   SYNTHROID 125 MCG tablet Generic drug:  levothyroxine Take 125 mcg by mouth daily before breakfast.   vitamin C 500 MG tablet Commonly known as:  ASCORBIC ACID Take 500 mg by mouth daily.       FOLLOW UP VISIT:   Follow-up Information    Earlie Server, MD. Schedule an appointment as soon as possible for a visit in 2 weeks.   Specialty:  Orthopedic Surgery Contact information: [place] [address] [phone]           DISPOSITION:   Skilled Nursing Facility/Rehab  CONDITION:  Stable   Chriss Czar, PA-C  11/25/2017 8:48 AM

## 2017-11-25 NOTE — Social Work (Addendum)
CSW will need to obtain PASSR for nursing facility placement. Medical doctor will need to sign 30 day note for short term rehab.  CSW will f/u as PASSR pending.  11:10am: CSW faxed clinicals to [facility] for review.  CSW will f/u.  12:15pm: CSW was advised by [facility] that since patient has medicaid, the FL2 long form must be completed. CSW completed and sent to [facility] Renee at ortho office to have Dr. French Ana to review and sign and send back to CSW .  CSW will continue to follow up.  Elissa Hefty, LCSW Clinical Social Worker [phone]

## 2017-11-25 NOTE — Progress Notes (Signed)
NURSING PROGRESS NOTE  Olivia [name] [id number] Discharge Data: 11/25/2017 4:27 PM Attending Provider: Earlie Server, MD NTI:RWERX, Hal Hope, MD     [name] to be D/C'd Rehab per MD order.  Discussed with the patient the After Visit Summary and all questions fully answered. All IV's discontinued with no bleeding noted. All belongings returned to patient for patient to take home. Report called to Willey Blade at [place].  Last Vital Signs:  Blood pressure 137/70, pulse 79, temperature 98.7 F (37.1 C), temperature source Oral, resp. rate 17, height 5\' 7"  (1.702 m), weight 117 kg (258 lb), SpO2 98 %.  Discharge Medication List Allergies as of 11/25/2017      Reactions   Demerol [meperidine] Nausea And Vomiting   Penicillins Other (See Comments)   Unknown childhood allergy Has patient had a PCN reaction causing immediate rash, facial/tongue/throat swelling, SOB or lightheadedness with hypotension: Unknown Has patient had a PCN reaction causing severe rash involving mucus membranes or skin necrosis: Unknown Has patient had a PCN reaction that required hospitalization: Unknown Has patient had a PCN reaction occurring within the last 10 years: No If all of the above answers are "NO", then may proceed with Cephalosporin use.      Medication List    STOP taking these medications   HYDROcodone-acetaminophen 5-325 MG tablet Commonly known as:  NORCO/VICODIN     TAKE these medications   acetaminophen 500 MG tablet Commonly known as:  TYLENOL Take 2 tablets (1,000 mg total) by mouth every 6 (six) hours as needed for mild pain or moderate pain.   apixaban 2.5 MG Tabs tablet Commonly known as:  ELIQUIS Take 1 tablet (2.5 mg total) by mouth 2 (two) times daily.   atenolol 25 MG tablet Commonly known as:  TENORMIN Take 25 mg by mouth daily before breakfast.   busPIRone 30 MG tablet Commonly known as:  BUSPAR Take 30 mg by mouth 2 (two) times daily.   calcium-vitamin D 500-200  MG-UNIT tablet Commonly known as:  OSCAL WITH D Take 1 tablet by mouth daily.   celecoxib 200 MG capsule Commonly known as:  CELEBREX Take 1 capsule (200 mg total) by mouth 2 (two) times daily.   clonazePAM 1 MG tablet Commonly known as:  KLONOPIN Take 0.5-1 mg by mouth See admin instructions. 1 mg 3 times daily , and 0.5 mg at 5:00p   DULoxetine 60 MG capsule Commonly known as:  CYMBALTA Take 120 mg by mouth daily.   esomeprazole 40 MG capsule Commonly known as:  NEXIUM Take 40 mg by mouth 2 (two) times daily before a meal.   gentamicin cream 0.1 % Commonly known as:  GARAMYCIN Apply 1 application topically 3 (three) times daily.   lisinopril-hydrochlorothiazide 20-25 MG tablet Commonly known as:  PRINZIDE,ZESTORETIC Take 1 tablet by mouth daily.   methocarbamol 500 MG tablet Commonly known as:  ROBAXIN Take 500 mg by mouth 4 (four) times daily as needed. spasms   mirtazapine 30 MG tablet Commonly known as:  REMERON Take 30 mg by mouth at bedtime.   multivitamin with minerals Tabs tablet Take 1 tablet by mouth daily.   oxyCODONE 5 MG immediate release tablet Commonly known as:  Oxy IR/ROXICODONE 1-2 tabs po q4-6hrs prn pain   pregabalin 75 MG capsule Commonly known as:  LYRICA Take 1 capsule (75 mg total) by mouth 2 (two) times daily.   SYNTHROID 125 MCG tablet Generic drug:  levothyroxine Take 125 mcg by mouth daily before breakfast.  vitamin C 500 MG tablet Commonly known as:  ASCORBIC ACID Take 500 mg by mouth daily.

## 2017-11-25 NOTE — Social Work (Addendum)
Pt will discharge to [facility] once PASSR has been approved.  CSW f/u on same.  2:43pm PASSR [id number] E received. CSW will f/u for disposition to [place].   Elissa Hefty, LCSW Clinical Social Worker [phone]

## 2017-11-25 NOTE — Clinical Social Work Placement (Addendum)
   CLINICAL SOCIAL WORK PLACEMENT  NOTE  Date:  11/25/2017  Patient Details  Name: Olivia [name] MRN: [id number] Date of Birth: [birth year]  Clinical Social Work is seeking post-discharge placement for this patient at the [place] level of care (*CSW will initial, date and re-position this form in  chart as items are completed):  Yes   Patient/family provided with [place] Work Department's list of facilities offering this level of care within the geographic area requested by the patient (or if unable, by the patient's family).  Yes   Patient/family informed of their freedom to choose among providers that offer the needed level of care, that participate in Medicare, Medicaid or managed care program needed by the patient, have an available bed and are willing to accept the patient.  Yes   Patient/family informed of [facility]'s ownership interest in [facility] and [facility], as well as of the fact that they are under no obligation to receive care at these facilities.  PASRR submitted to [employer] on       PASRR number received on 11/25/17     Existing PASRR number confirmed on       FL2 transmitted to all facilities in geographic area requested by pt/family on 11/24/17     FL2 transmitted to all facilities within larger geographic area on       Patient informed that his/her managed care company has contracts with or will negotiate with certain facilities, including the following:        Yes   Patient/family informed of bed offers received.  Patient chooses bed at [facility] recommends and patient chooses bed at      Patient to be transferred to [facility] [place] on 11/25/17.  Patient to be transferred to facility by [facility]     Patient family notified on 11/25/17 of transfer.  Name of family member notified:  pt responsible for self, and CSW called son and left voice message  PHYSICIAN       Additional  Comment:    _______________________________________________ Normajean Baxter, LCSW 11/25/2017, 2:46 PM

## 2017-11-25 NOTE — Social Work (Signed)
Clinical Social Worker facilitated patient discharge including contacting patient family and facility to confirm patient discharge plans.  Clinical information faxed to facility and family agreeable with plan.    CSW arranged ambulance transport via [facility] to [place] [facility] [place].    RN to call [phone] to give report prior to discharge. Pt going to Room 7012.  Clinical Social Worker will sign off for now as social work intervention is no longer needed. Please consult [name] again if new need arises.  Elissa Hefty, LCSW Clinical Social Worker [phone]

## 2017-11-25 NOTE — Progress Notes (Signed)
Transport arrived to discharge patient

## 2017-11-27 DIAGNOSIS — Z471 Aftercare following joint replacement surgery: Secondary | ICD-10-CM | POA: Diagnosis not present

## 2017-11-27 DIAGNOSIS — K5900 Constipation, unspecified: Secondary | ICD-10-CM | POA: Diagnosis not present

## 2017-11-27 DIAGNOSIS — M1711 Unilateral primary osteoarthritis, right knee: Secondary | ICD-10-CM | POA: Diagnosis not present

## 2017-11-27 DIAGNOSIS — I10 Essential (primary) hypertension: Secondary | ICD-10-CM | POA: Diagnosis not present

## 2017-11-27 DIAGNOSIS — F419 Anxiety disorder, unspecified: Secondary | ICD-10-CM | POA: Diagnosis not present

## 2017-12-02 DIAGNOSIS — B370 Candidal stomatitis: Secondary | ICD-10-CM | POA: Diagnosis not present

## 2017-12-02 DIAGNOSIS — Z471 Aftercare following joint replacement surgery: Secondary | ICD-10-CM | POA: Diagnosis not present

## 2017-12-02 DIAGNOSIS — K219 Gastro-esophageal reflux disease without esophagitis: Secondary | ICD-10-CM | POA: Diagnosis not present

## 2017-12-02 DIAGNOSIS — F419 Anxiety disorder, unspecified: Secondary | ICD-10-CM | POA: Diagnosis not present

## 2017-12-05 DIAGNOSIS — Z96651 Presence of right artificial knee joint: Secondary | ICD-10-CM | POA: Diagnosis not present

## 2017-12-05 DIAGNOSIS — M25661 Stiffness of right knee, not elsewhere classified: Secondary | ICD-10-CM | POA: Diagnosis not present

## 2017-12-05 DIAGNOSIS — M1711 Unilateral primary osteoarthritis, right knee: Secondary | ICD-10-CM | POA: Diagnosis not present

## 2017-12-05 DIAGNOSIS — M25561 Pain in right knee: Secondary | ICD-10-CM | POA: Diagnosis not present

## 2017-12-11 DIAGNOSIS — Z96651 Presence of right artificial knee joint: Secondary | ICD-10-CM | POA: Diagnosis not present

## 2017-12-11 DIAGNOSIS — M25661 Stiffness of right knee, not elsewhere classified: Secondary | ICD-10-CM | POA: Diagnosis not present

## 2017-12-11 DIAGNOSIS — M25561 Pain in right knee: Secondary | ICD-10-CM | POA: Diagnosis not present

## 2017-12-17 DIAGNOSIS — M25561 Pain in right knee: Secondary | ICD-10-CM | POA: Diagnosis not present

## 2017-12-17 DIAGNOSIS — M25661 Stiffness of right knee, not elsewhere classified: Secondary | ICD-10-CM | POA: Diagnosis not present

## 2017-12-17 DIAGNOSIS — Z96651 Presence of right artificial knee joint: Secondary | ICD-10-CM | POA: Diagnosis not present

## 2017-12-19 DIAGNOSIS — Z96651 Presence of right artificial knee joint: Secondary | ICD-10-CM | POA: Diagnosis not present

## 2017-12-19 DIAGNOSIS — M25661 Stiffness of right knee, not elsewhere classified: Secondary | ICD-10-CM | POA: Diagnosis not present

## 2017-12-19 DIAGNOSIS — M25561 Pain in right knee: Secondary | ICD-10-CM | POA: Diagnosis not present

## 2017-12-23 ENCOUNTER — Ambulatory Visit (INDEPENDENT_AMBULATORY_CARE_PROVIDER_SITE_OTHER): Payer: Medicare Other | Admitting: Podiatry

## 2017-12-23 ENCOUNTER — Encounter: Payer: Self-pay | Admitting: Podiatry

## 2017-12-23 DIAGNOSIS — L600 Ingrowing nail: Secondary | ICD-10-CM | POA: Diagnosis not present

## 2017-12-24 DIAGNOSIS — M25661 Stiffness of right knee, not elsewhere classified: Secondary | ICD-10-CM | POA: Diagnosis not present

## 2017-12-24 DIAGNOSIS — Z96651 Presence of right artificial knee joint: Secondary | ICD-10-CM | POA: Diagnosis not present

## 2017-12-24 DIAGNOSIS — M25561 Pain in right knee: Secondary | ICD-10-CM | POA: Diagnosis not present

## 2017-12-25 NOTE — Progress Notes (Signed)
   Subjective: Patient presents today status post ingrown permanent nail avulsion procedure of the medial border of the right great toe that was done about 6 weeks ago. She reports continued soreness of the area. She denies drainage or swelling. There are no modifying factors noted. She reports having a right knee replacement on 11/22/17. Patient is here for further evaluation and treatment.   Past Medical History:  Diagnosis Date  . Anxiety    [facility]  . Arthritis   . Cataract    THESE BEEN REPLACED  . Depression    [facility]  . Diverticulosis   . Dry eye syndrome   . GERD (gastroesophageal reflux disease)   . History of colon polyps 12/03/2012   COLONOSCOPY  . History of exercise stress test 2010   Dr. Ashok Norris   . Hyperlipidemia   . Hypertension   . Hypothyroidism   . Multinodular goiter    DR. KERR  . OSA (obstructive sleep apnea)    (PSG 12/12/15 ESS 2, AHI 42/HR REM 30/HR, O2 MIN 80%)  CPAP- q night , done with [facility].  study done at [facility]  . Squamous cell carcinoma in situ 2015  . Thyroid disease    Nodules  . Tubular adenoma    DR. [place]  . Varicose veins     Objective: Skin is warm, dry and supple. Nail and respective nail fold appears to be healing appropriately. Open wound to the associated nail fold with a granular wound base and moderate amount of fibrotic tissue. Minimal drainage noted. Mild erythema around the periungual region likely due to phenol chemical matricectomy.  Assessment: #1 postop permanent partial nail avulsion medial border right great toe #2 open wound periungual nail fold of respective digit.  #3 h/o right knee replacement on 11/22/17  Plan of care: #1 patient was evaluated  #2 debridement of open wound was performed to the periungual border of the respective toe using a currette. Antibiotic ointment and Band-Aid was applied. #3 patient is to return to clinic on a PRN basis.   Edrick Kins,  DPM [facility]  Dr. Edrick Kins, [address]                                        [place], [place] [address]                Office [phone]  Fax [phone]

## 2017-12-27 DIAGNOSIS — Z96651 Presence of right artificial knee joint: Secondary | ICD-10-CM | POA: Diagnosis not present

## 2017-12-27 DIAGNOSIS — M25561 Pain in right knee: Secondary | ICD-10-CM | POA: Diagnosis not present

## 2017-12-27 DIAGNOSIS — M25661 Stiffness of right knee, not elsewhere classified: Secondary | ICD-10-CM | POA: Diagnosis not present

## 2018-01-02 DIAGNOSIS — M1711 Unilateral primary osteoarthritis, right knee: Secondary | ICD-10-CM | POA: Diagnosis not present

## 2018-01-03 DIAGNOSIS — M25561 Pain in right knee: Secondary | ICD-10-CM | POA: Diagnosis not present

## 2018-01-03 DIAGNOSIS — M25661 Stiffness of right knee, not elsewhere classified: Secondary | ICD-10-CM | POA: Diagnosis not present

## 2018-01-03 DIAGNOSIS — Z96651 Presence of right artificial knee joint: Secondary | ICD-10-CM | POA: Diagnosis not present

## 2018-01-06 DIAGNOSIS — M9902 Segmental and somatic dysfunction of thoracic region: Secondary | ICD-10-CM | POA: Diagnosis not present

## 2018-01-06 DIAGNOSIS — M47812 Spondylosis without myelopathy or radiculopathy, cervical region: Secondary | ICD-10-CM | POA: Diagnosis not present

## 2018-01-06 DIAGNOSIS — M5136 Other intervertebral disc degeneration, lumbar region: Secondary | ICD-10-CM | POA: Diagnosis not present

## 2018-01-06 DIAGNOSIS — M9901 Segmental and somatic dysfunction of cervical region: Secondary | ICD-10-CM | POA: Diagnosis not present

## 2018-01-06 DIAGNOSIS — S29012A Strain of muscle and tendon of back wall of thorax, initial encounter: Secondary | ICD-10-CM | POA: Diagnosis not present

## 2018-01-06 DIAGNOSIS — M9903 Segmental and somatic dysfunction of lumbar region: Secondary | ICD-10-CM | POA: Diagnosis not present

## 2018-01-08 DIAGNOSIS — M25561 Pain in right knee: Secondary | ICD-10-CM | POA: Diagnosis not present

## 2018-01-08 DIAGNOSIS — M25661 Stiffness of right knee, not elsewhere classified: Secondary | ICD-10-CM | POA: Diagnosis not present

## 2018-01-08 DIAGNOSIS — Z96651 Presence of right artificial knee joint: Secondary | ICD-10-CM | POA: Diagnosis not present

## 2018-01-10 DIAGNOSIS — Z96651 Presence of right artificial knee joint: Secondary | ICD-10-CM | POA: Diagnosis not present

## 2018-01-10 DIAGNOSIS — M25661 Stiffness of right knee, not elsewhere classified: Secondary | ICD-10-CM | POA: Diagnosis not present

## 2018-01-10 DIAGNOSIS — M25561 Pain in right knee: Secondary | ICD-10-CM | POA: Diagnosis not present

## 2018-01-14 DIAGNOSIS — M25561 Pain in right knee: Secondary | ICD-10-CM | POA: Diagnosis not present

## 2018-01-14 DIAGNOSIS — M25661 Stiffness of right knee, not elsewhere classified: Secondary | ICD-10-CM | POA: Diagnosis not present

## 2018-01-14 DIAGNOSIS — Z96651 Presence of right artificial knee joint: Secondary | ICD-10-CM | POA: Diagnosis not present

## 2018-01-16 DIAGNOSIS — M25661 Stiffness of right knee, not elsewhere classified: Secondary | ICD-10-CM | POA: Diagnosis not present

## 2018-01-16 DIAGNOSIS — M25561 Pain in right knee: Secondary | ICD-10-CM | POA: Diagnosis not present

## 2018-01-16 DIAGNOSIS — S66324D Laceration of extensor muscle, fascia and tendon of right ring finger at wrist and hand level, subsequent encounter: Secondary | ICD-10-CM | POA: Diagnosis not present

## 2018-01-21 DIAGNOSIS — M25561 Pain in right knee: Secondary | ICD-10-CM | POA: Diagnosis not present

## 2018-01-21 DIAGNOSIS — Z96651 Presence of right artificial knee joint: Secondary | ICD-10-CM | POA: Diagnosis not present

## 2018-01-21 DIAGNOSIS — M25661 Stiffness of right knee, not elsewhere classified: Secondary | ICD-10-CM | POA: Diagnosis not present

## 2018-01-22 DIAGNOSIS — K219 Gastro-esophageal reflux disease without esophagitis: Secondary | ICD-10-CM | POA: Diagnosis not present

## 2018-01-22 DIAGNOSIS — E042 Nontoxic multinodular goiter: Secondary | ICD-10-CM | POA: Diagnosis not present

## 2018-01-22 DIAGNOSIS — E039 Hypothyroidism, unspecified: Secondary | ICD-10-CM | POA: Diagnosis not present

## 2018-01-22 DIAGNOSIS — Z23 Encounter for immunization: Secondary | ICD-10-CM | POA: Diagnosis not present

## 2018-01-22 DIAGNOSIS — E7800 Pure hypercholesterolemia, unspecified: Secondary | ICD-10-CM | POA: Diagnosis not present

## 2018-01-22 DIAGNOSIS — G4733 Obstructive sleep apnea (adult) (pediatric): Secondary | ICD-10-CM | POA: Diagnosis not present

## 2018-01-22 DIAGNOSIS — I10 Essential (primary) hypertension: Secondary | ICD-10-CM | POA: Diagnosis not present

## 2018-01-22 DIAGNOSIS — Z0000 Encounter for general adult medical examination without abnormal findings: Secondary | ICD-10-CM | POA: Diagnosis not present

## 2018-01-22 DIAGNOSIS — Z1389 Encounter for screening for other disorder: Secondary | ICD-10-CM | POA: Diagnosis not present

## 2018-01-22 DIAGNOSIS — F324 Major depressive disorder, single episode, in partial remission: Secondary | ICD-10-CM | POA: Diagnosis not present

## 2018-01-22 DIAGNOSIS — F419 Anxiety disorder, unspecified: Secondary | ICD-10-CM | POA: Diagnosis not present

## 2018-01-23 ENCOUNTER — Other Ambulatory Visit: Payer: Self-pay | Admitting: Family Medicine

## 2018-01-23 DIAGNOSIS — M25561 Pain in right knee: Secondary | ICD-10-CM | POA: Diagnosis not present

## 2018-01-23 DIAGNOSIS — M25661 Stiffness of right knee, not elsewhere classified: Secondary | ICD-10-CM | POA: Diagnosis not present

## 2018-01-23 DIAGNOSIS — E042 Nontoxic multinodular goiter: Secondary | ICD-10-CM

## 2018-01-23 DIAGNOSIS — Z96651 Presence of right artificial knee joint: Secondary | ICD-10-CM | POA: Diagnosis not present

## 2018-01-29 DIAGNOSIS — M25561 Pain in right knee: Secondary | ICD-10-CM | POA: Diagnosis not present

## 2018-01-29 DIAGNOSIS — M25661 Stiffness of right knee, not elsewhere classified: Secondary | ICD-10-CM | POA: Diagnosis not present

## 2018-01-29 DIAGNOSIS — Z96651 Presence of right artificial knee joint: Secondary | ICD-10-CM | POA: Diagnosis not present

## 2018-01-31 DIAGNOSIS — Z96651 Presence of right artificial knee joint: Secondary | ICD-10-CM | POA: Diagnosis not present

## 2018-01-31 DIAGNOSIS — M25661 Stiffness of right knee, not elsewhere classified: Secondary | ICD-10-CM | POA: Diagnosis not present

## 2018-01-31 DIAGNOSIS — M25561 Pain in right knee: Secondary | ICD-10-CM | POA: Diagnosis not present

## 2018-02-12 DIAGNOSIS — Z96651 Presence of right artificial knee joint: Secondary | ICD-10-CM | POA: Diagnosis not present

## 2018-02-12 DIAGNOSIS — M25561 Pain in right knee: Secondary | ICD-10-CM | POA: Diagnosis not present

## 2018-02-12 DIAGNOSIS — M25661 Stiffness of right knee, not elsewhere classified: Secondary | ICD-10-CM | POA: Diagnosis not present

## 2018-02-14 DIAGNOSIS — M25561 Pain in right knee: Secondary | ICD-10-CM | POA: Diagnosis not present

## 2018-02-14 DIAGNOSIS — Z96651 Presence of right artificial knee joint: Secondary | ICD-10-CM | POA: Diagnosis not present

## 2018-02-14 DIAGNOSIS — M25661 Stiffness of right knee, not elsewhere classified: Secondary | ICD-10-CM | POA: Diagnosis not present

## 2018-02-17 DIAGNOSIS — M25561 Pain in right knee: Secondary | ICD-10-CM | POA: Diagnosis not present

## 2018-02-17 DIAGNOSIS — Z96651 Presence of right artificial knee joint: Secondary | ICD-10-CM | POA: Diagnosis not present

## 2018-02-17 DIAGNOSIS — M25661 Stiffness of right knee, not elsewhere classified: Secondary | ICD-10-CM | POA: Diagnosis not present

## 2018-02-19 DIAGNOSIS — M25561 Pain in right knee: Secondary | ICD-10-CM | POA: Diagnosis not present

## 2018-02-19 DIAGNOSIS — Z96651 Presence of right artificial knee joint: Secondary | ICD-10-CM | POA: Diagnosis not present

## 2018-02-19 DIAGNOSIS — M25661 Stiffness of right knee, not elsewhere classified: Secondary | ICD-10-CM | POA: Diagnosis not present

## 2018-02-21 DIAGNOSIS — M545 Low back pain: Secondary | ICD-10-CM | POA: Diagnosis not present

## 2018-02-26 DIAGNOSIS — M25661 Stiffness of right knee, not elsewhere classified: Secondary | ICD-10-CM | POA: Diagnosis not present

## 2018-02-26 DIAGNOSIS — M47812 Spondylosis without myelopathy or radiculopathy, cervical region: Secondary | ICD-10-CM | POA: Diagnosis not present

## 2018-02-26 DIAGNOSIS — M25561 Pain in right knee: Secondary | ICD-10-CM | POA: Diagnosis not present

## 2018-02-26 DIAGNOSIS — M9901 Segmental and somatic dysfunction of cervical region: Secondary | ICD-10-CM | POA: Diagnosis not present

## 2018-02-26 DIAGNOSIS — S29012A Strain of muscle and tendon of back wall of thorax, initial encounter: Secondary | ICD-10-CM | POA: Diagnosis not present

## 2018-02-26 DIAGNOSIS — M9903 Segmental and somatic dysfunction of lumbar region: Secondary | ICD-10-CM | POA: Diagnosis not present

## 2018-02-26 DIAGNOSIS — Z96651 Presence of right artificial knee joint: Secondary | ICD-10-CM | POA: Diagnosis not present

## 2018-02-26 DIAGNOSIS — M5136 Other intervertebral disc degeneration, lumbar region: Secondary | ICD-10-CM | POA: Diagnosis not present

## 2018-02-26 DIAGNOSIS — M9902 Segmental and somatic dysfunction of thoracic region: Secondary | ICD-10-CM | POA: Diagnosis not present

## 2018-02-27 DIAGNOSIS — L821 Other seborrheic keratosis: Secondary | ICD-10-CM | POA: Diagnosis not present

## 2018-02-27 DIAGNOSIS — L820 Inflamed seborrheic keratosis: Secondary | ICD-10-CM | POA: Diagnosis not present

## 2018-02-28 DIAGNOSIS — M9902 Segmental and somatic dysfunction of thoracic region: Secondary | ICD-10-CM | POA: Diagnosis not present

## 2018-02-28 DIAGNOSIS — M9901 Segmental and somatic dysfunction of cervical region: Secondary | ICD-10-CM | POA: Diagnosis not present

## 2018-02-28 DIAGNOSIS — M5136 Other intervertebral disc degeneration, lumbar region: Secondary | ICD-10-CM | POA: Diagnosis not present

## 2018-02-28 DIAGNOSIS — M9903 Segmental and somatic dysfunction of lumbar region: Secondary | ICD-10-CM | POA: Diagnosis not present

## 2018-02-28 DIAGNOSIS — M47812 Spondylosis without myelopathy or radiculopathy, cervical region: Secondary | ICD-10-CM | POA: Diagnosis not present

## 2018-02-28 DIAGNOSIS — S29012A Strain of muscle and tendon of back wall of thorax, initial encounter: Secondary | ICD-10-CM | POA: Diagnosis not present

## 2018-03-03 DIAGNOSIS — F331 Major depressive disorder, recurrent, moderate: Secondary | ICD-10-CM | POA: Diagnosis not present

## 2018-03-04 DIAGNOSIS — M25561 Pain in right knee: Secondary | ICD-10-CM | POA: Diagnosis not present

## 2018-03-04 DIAGNOSIS — M25661 Stiffness of right knee, not elsewhere classified: Secondary | ICD-10-CM | POA: Diagnosis not present

## 2018-03-04 DIAGNOSIS — Z96651 Presence of right artificial knee joint: Secondary | ICD-10-CM | POA: Diagnosis not present

## 2018-03-05 DIAGNOSIS — M9903 Segmental and somatic dysfunction of lumbar region: Secondary | ICD-10-CM | POA: Diagnosis not present

## 2018-03-05 DIAGNOSIS — M5136 Other intervertebral disc degeneration, lumbar region: Secondary | ICD-10-CM | POA: Diagnosis not present

## 2018-03-05 DIAGNOSIS — M47812 Spondylosis without myelopathy or radiculopathy, cervical region: Secondary | ICD-10-CM | POA: Diagnosis not present

## 2018-03-05 DIAGNOSIS — S29012A Strain of muscle and tendon of back wall of thorax, initial encounter: Secondary | ICD-10-CM | POA: Diagnosis not present

## 2018-03-05 DIAGNOSIS — M9902 Segmental and somatic dysfunction of thoracic region: Secondary | ICD-10-CM | POA: Diagnosis not present

## 2018-03-05 DIAGNOSIS — M9901 Segmental and somatic dysfunction of cervical region: Secondary | ICD-10-CM | POA: Diagnosis not present

## 2018-03-06 DIAGNOSIS — M25561 Pain in right knee: Secondary | ICD-10-CM | POA: Diagnosis not present

## 2018-03-10 DIAGNOSIS — M5136 Other intervertebral disc degeneration, lumbar region: Secondary | ICD-10-CM | POA: Diagnosis not present

## 2018-03-10 DIAGNOSIS — M9901 Segmental and somatic dysfunction of cervical region: Secondary | ICD-10-CM | POA: Diagnosis not present

## 2018-03-10 DIAGNOSIS — M9903 Segmental and somatic dysfunction of lumbar region: Secondary | ICD-10-CM | POA: Diagnosis not present

## 2018-03-10 DIAGNOSIS — M9902 Segmental and somatic dysfunction of thoracic region: Secondary | ICD-10-CM | POA: Diagnosis not present

## 2018-03-10 DIAGNOSIS — S29012A Strain of muscle and tendon of back wall of thorax, initial encounter: Secondary | ICD-10-CM | POA: Diagnosis not present

## 2018-03-10 DIAGNOSIS — M47812 Spondylosis without myelopathy or radiculopathy, cervical region: Secondary | ICD-10-CM | POA: Diagnosis not present

## 2018-03-11 DIAGNOSIS — M25561 Pain in right knee: Secondary | ICD-10-CM | POA: Diagnosis not present

## 2018-03-11 DIAGNOSIS — Z96651 Presence of right artificial knee joint: Secondary | ICD-10-CM | POA: Diagnosis not present

## 2018-03-11 DIAGNOSIS — M25661 Stiffness of right knee, not elsewhere classified: Secondary | ICD-10-CM | POA: Diagnosis not present

## 2018-03-12 DIAGNOSIS — M9902 Segmental and somatic dysfunction of thoracic region: Secondary | ICD-10-CM | POA: Diagnosis not present

## 2018-03-12 DIAGNOSIS — M9903 Segmental and somatic dysfunction of lumbar region: Secondary | ICD-10-CM | POA: Diagnosis not present

## 2018-03-12 DIAGNOSIS — M5136 Other intervertebral disc degeneration, lumbar region: Secondary | ICD-10-CM | POA: Diagnosis not present

## 2018-03-12 DIAGNOSIS — M9901 Segmental and somatic dysfunction of cervical region: Secondary | ICD-10-CM | POA: Diagnosis not present

## 2018-03-12 DIAGNOSIS — M47812 Spondylosis without myelopathy or radiculopathy, cervical region: Secondary | ICD-10-CM | POA: Diagnosis not present

## 2018-03-12 DIAGNOSIS — S29012A Strain of muscle and tendon of back wall of thorax, initial encounter: Secondary | ICD-10-CM | POA: Diagnosis not present

## 2018-03-13 DIAGNOSIS — M25561 Pain in right knee: Secondary | ICD-10-CM | POA: Diagnosis not present

## 2018-03-13 DIAGNOSIS — M25661 Stiffness of right knee, not elsewhere classified: Secondary | ICD-10-CM | POA: Diagnosis not present

## 2018-03-13 DIAGNOSIS — Z96651 Presence of right artificial knee joint: Secondary | ICD-10-CM | POA: Diagnosis not present

## 2018-03-17 DIAGNOSIS — M9901 Segmental and somatic dysfunction of cervical region: Secondary | ICD-10-CM | POA: Diagnosis not present

## 2018-03-17 DIAGNOSIS — M5136 Other intervertebral disc degeneration, lumbar region: Secondary | ICD-10-CM | POA: Diagnosis not present

## 2018-03-17 DIAGNOSIS — M9902 Segmental and somatic dysfunction of thoracic region: Secondary | ICD-10-CM | POA: Diagnosis not present

## 2018-03-17 DIAGNOSIS — M47812 Spondylosis without myelopathy or radiculopathy, cervical region: Secondary | ICD-10-CM | POA: Diagnosis not present

## 2018-03-17 DIAGNOSIS — S29012A Strain of muscle and tendon of back wall of thorax, initial encounter: Secondary | ICD-10-CM | POA: Diagnosis not present

## 2018-03-17 DIAGNOSIS — M9903 Segmental and somatic dysfunction of lumbar region: Secondary | ICD-10-CM | POA: Diagnosis not present

## 2018-03-18 DIAGNOSIS — M25561 Pain in right knee: Secondary | ICD-10-CM | POA: Diagnosis not present

## 2018-03-18 DIAGNOSIS — Z96651 Presence of right artificial knee joint: Secondary | ICD-10-CM | POA: Diagnosis not present

## 2018-03-18 DIAGNOSIS — M25661 Stiffness of right knee, not elsewhere classified: Secondary | ICD-10-CM | POA: Diagnosis not present

## 2018-03-19 DIAGNOSIS — M9902 Segmental and somatic dysfunction of thoracic region: Secondary | ICD-10-CM | POA: Diagnosis not present

## 2018-03-19 DIAGNOSIS — M5136 Other intervertebral disc degeneration, lumbar region: Secondary | ICD-10-CM | POA: Diagnosis not present

## 2018-03-19 DIAGNOSIS — M47812 Spondylosis without myelopathy or radiculopathy, cervical region: Secondary | ICD-10-CM | POA: Diagnosis not present

## 2018-03-19 DIAGNOSIS — M9903 Segmental and somatic dysfunction of lumbar region: Secondary | ICD-10-CM | POA: Diagnosis not present

## 2018-03-19 DIAGNOSIS — S29012A Strain of muscle and tendon of back wall of thorax, initial encounter: Secondary | ICD-10-CM | POA: Diagnosis not present

## 2018-03-19 DIAGNOSIS — M9901 Segmental and somatic dysfunction of cervical region: Secondary | ICD-10-CM | POA: Diagnosis not present

## 2018-03-20 DIAGNOSIS — Z96651 Presence of right artificial knee joint: Secondary | ICD-10-CM | POA: Diagnosis not present

## 2018-03-20 DIAGNOSIS — M25561 Pain in right knee: Secondary | ICD-10-CM | POA: Diagnosis not present

## 2018-03-20 DIAGNOSIS — M25661 Stiffness of right knee, not elsewhere classified: Secondary | ICD-10-CM | POA: Diagnosis not present

## 2018-03-24 DIAGNOSIS — M47812 Spondylosis without myelopathy or radiculopathy, cervical region: Secondary | ICD-10-CM | POA: Diagnosis not present

## 2018-03-24 DIAGNOSIS — M9902 Segmental and somatic dysfunction of thoracic region: Secondary | ICD-10-CM | POA: Diagnosis not present

## 2018-03-24 DIAGNOSIS — S29012A Strain of muscle and tendon of back wall of thorax, initial encounter: Secondary | ICD-10-CM | POA: Diagnosis not present

## 2018-03-24 DIAGNOSIS — M9903 Segmental and somatic dysfunction of lumbar region: Secondary | ICD-10-CM | POA: Diagnosis not present

## 2018-03-24 DIAGNOSIS — M5136 Other intervertebral disc degeneration, lumbar region: Secondary | ICD-10-CM | POA: Diagnosis not present

## 2018-03-24 DIAGNOSIS — M9901 Segmental and somatic dysfunction of cervical region: Secondary | ICD-10-CM | POA: Diagnosis not present

## 2018-03-25 DIAGNOSIS — Z96651 Presence of right artificial knee joint: Secondary | ICD-10-CM | POA: Diagnosis not present

## 2018-03-25 DIAGNOSIS — M25561 Pain in right knee: Secondary | ICD-10-CM | POA: Diagnosis not present

## 2018-03-25 DIAGNOSIS — M25661 Stiffness of right knee, not elsewhere classified: Secondary | ICD-10-CM | POA: Diagnosis not present

## 2018-03-26 DIAGNOSIS — M9903 Segmental and somatic dysfunction of lumbar region: Secondary | ICD-10-CM | POA: Diagnosis not present

## 2018-03-26 DIAGNOSIS — M9902 Segmental and somatic dysfunction of thoracic region: Secondary | ICD-10-CM | POA: Diagnosis not present

## 2018-03-26 DIAGNOSIS — M5136 Other intervertebral disc degeneration, lumbar region: Secondary | ICD-10-CM | POA: Diagnosis not present

## 2018-03-26 DIAGNOSIS — M9901 Segmental and somatic dysfunction of cervical region: Secondary | ICD-10-CM | POA: Diagnosis not present

## 2018-03-26 DIAGNOSIS — S29012A Strain of muscle and tendon of back wall of thorax, initial encounter: Secondary | ICD-10-CM | POA: Diagnosis not present

## 2018-03-26 DIAGNOSIS — M47812 Spondylosis without myelopathy or radiculopathy, cervical region: Secondary | ICD-10-CM | POA: Diagnosis not present

## 2018-03-27 DIAGNOSIS — M25561 Pain in right knee: Secondary | ICD-10-CM | POA: Diagnosis not present

## 2018-03-29 ENCOUNTER — Other Ambulatory Visit: Payer: Self-pay | Admitting: Neurology

## 2018-03-31 DIAGNOSIS — M25561 Pain in right knee: Secondary | ICD-10-CM | POA: Diagnosis not present

## 2018-04-02 DIAGNOSIS — M25661 Stiffness of right knee, not elsewhere classified: Secondary | ICD-10-CM | POA: Diagnosis not present

## 2018-04-02 DIAGNOSIS — M25561 Pain in right knee: Secondary | ICD-10-CM | POA: Diagnosis not present

## 2018-04-02 DIAGNOSIS — Z96651 Presence of right artificial knee joint: Secondary | ICD-10-CM | POA: Diagnosis not present

## 2018-04-04 DIAGNOSIS — M25661 Stiffness of right knee, not elsewhere classified: Secondary | ICD-10-CM | POA: Diagnosis not present

## 2018-04-04 DIAGNOSIS — M25561 Pain in right knee: Secondary | ICD-10-CM | POA: Diagnosis not present

## 2018-04-04 DIAGNOSIS — Z96651 Presence of right artificial knee joint: Secondary | ICD-10-CM | POA: Diagnosis not present

## 2018-04-08 ENCOUNTER — Other Ambulatory Visit: Payer: Self-pay | Admitting: Neurology

## 2018-04-09 DIAGNOSIS — M47812 Spondylosis without myelopathy or radiculopathy, cervical region: Secondary | ICD-10-CM | POA: Diagnosis not present

## 2018-04-09 DIAGNOSIS — M9903 Segmental and somatic dysfunction of lumbar region: Secondary | ICD-10-CM | POA: Diagnosis not present

## 2018-04-09 DIAGNOSIS — M9902 Segmental and somatic dysfunction of thoracic region: Secondary | ICD-10-CM | POA: Diagnosis not present

## 2018-04-09 DIAGNOSIS — Z96651 Presence of right artificial knee joint: Secondary | ICD-10-CM | POA: Diagnosis not present

## 2018-04-09 DIAGNOSIS — M9901 Segmental and somatic dysfunction of cervical region: Secondary | ICD-10-CM | POA: Diagnosis not present

## 2018-04-09 DIAGNOSIS — M25661 Stiffness of right knee, not elsewhere classified: Secondary | ICD-10-CM | POA: Diagnosis not present

## 2018-04-09 DIAGNOSIS — M25561 Pain in right knee: Secondary | ICD-10-CM | POA: Diagnosis not present

## 2018-04-09 DIAGNOSIS — S29012A Strain of muscle and tendon of back wall of thorax, initial encounter: Secondary | ICD-10-CM | POA: Diagnosis not present

## 2018-04-09 DIAGNOSIS — M5136 Other intervertebral disc degeneration, lumbar region: Secondary | ICD-10-CM | POA: Diagnosis not present

## 2018-04-10 DIAGNOSIS — M25661 Stiffness of right knee, not elsewhere classified: Secondary | ICD-10-CM | POA: Diagnosis not present

## 2018-04-10 DIAGNOSIS — Z96651 Presence of right artificial knee joint: Secondary | ICD-10-CM | POA: Diagnosis not present

## 2018-04-10 DIAGNOSIS — M25561 Pain in right knee: Secondary | ICD-10-CM | POA: Diagnosis not present

## 2018-04-21 DIAGNOSIS — M25561 Pain in right knee: Secondary | ICD-10-CM | POA: Diagnosis not present

## 2018-04-21 DIAGNOSIS — M25661 Stiffness of right knee, not elsewhere classified: Secondary | ICD-10-CM | POA: Diagnosis not present

## 2018-04-21 DIAGNOSIS — Z96652 Presence of left artificial knee joint: Secondary | ICD-10-CM | POA: Diagnosis not present

## 2018-04-22 DIAGNOSIS — M5136 Other intervertebral disc degeneration, lumbar region: Secondary | ICD-10-CM | POA: Diagnosis not present

## 2018-04-22 DIAGNOSIS — M9902 Segmental and somatic dysfunction of thoracic region: Secondary | ICD-10-CM | POA: Diagnosis not present

## 2018-04-22 DIAGNOSIS — M9901 Segmental and somatic dysfunction of cervical region: Secondary | ICD-10-CM | POA: Diagnosis not present

## 2018-04-22 DIAGNOSIS — M9903 Segmental and somatic dysfunction of lumbar region: Secondary | ICD-10-CM | POA: Diagnosis not present

## 2018-04-22 DIAGNOSIS — S29012A Strain of muscle and tendon of back wall of thorax, initial encounter: Secondary | ICD-10-CM | POA: Diagnosis not present

## 2018-04-22 DIAGNOSIS — M47812 Spondylosis without myelopathy or radiculopathy, cervical region: Secondary | ICD-10-CM | POA: Diagnosis not present

## 2018-04-23 ENCOUNTER — Other Ambulatory Visit: Payer: Self-pay | Admitting: Neurology

## 2018-04-24 DIAGNOSIS — Z01812 Encounter for preprocedural laboratory examination: Secondary | ICD-10-CM | POA: Diagnosis not present

## 2018-04-24 DIAGNOSIS — M25561 Pain in right knee: Secondary | ICD-10-CM | POA: Diagnosis not present

## 2018-04-25 ENCOUNTER — Ambulatory Visit (HOSPITAL_COMMUNITY)
Admission: RE | Admit: 2018-04-25 | Discharge: 2018-04-25 | Disposition: A | Discharge status: Home / Self Care | Payer: Medicare Other | Source: Ambulatory Visit | Attending: Cardiology | Admitting: Cardiology

## 2018-04-25 ENCOUNTER — Other Ambulatory Visit (HOSPITAL_COMMUNITY): Payer: Self-pay | Admitting: Orthopedic Surgery

## 2018-04-25 ENCOUNTER — Other Ambulatory Visit: Payer: Self-pay | Admitting: *Deleted

## 2018-04-25 ENCOUNTER — Telehealth: Payer: Self-pay | Admitting: Neurology

## 2018-04-25 DIAGNOSIS — R609 Edema, unspecified: Secondary | ICD-10-CM | POA: Diagnosis not present

## 2018-04-25 DIAGNOSIS — R52 Pain, unspecified: Secondary | ICD-10-CM | POA: Diagnosis not present

## 2018-04-25 MED ORDER — PREGABALIN 75 MG PO CAPS: 75.0000 mg | ORAL_CAPSULE | Freq: Two times a day (BID) | ORAL | 5 refills | Status: DC

## 2018-04-25 NOTE — Telephone Encounter (Signed)
Rx sent in with 5 refills.  No more refills until after December appointment.

## 2018-04-25 NOTE — Telephone Encounter (Signed)
Patient called needing to get a refill on her Lyrica. She is completely out. She is asking if she can have it filled until her next Follow Up in December? [facility] uses [facility] [address]. Thanks

## 2018-05-06 DIAGNOSIS — Z6836 Body mass index (BMI) 36.0-36.9, adult: Secondary | ICD-10-CM | POA: Diagnosis not present

## 2018-05-06 DIAGNOSIS — M25561 Pain in right knee: Secondary | ICD-10-CM | POA: Diagnosis not present

## 2018-05-20 DIAGNOSIS — M9904 Segmental and somatic dysfunction of sacral region: Secondary | ICD-10-CM | POA: Diagnosis not present

## 2018-05-20 DIAGNOSIS — M47812 Spondylosis without myelopathy or radiculopathy, cervical region: Secondary | ICD-10-CM | POA: Diagnosis not present

## 2018-05-20 DIAGNOSIS — S29012A Strain of muscle and tendon of back wall of thorax, initial encounter: Secondary | ICD-10-CM | POA: Diagnosis not present

## 2018-05-20 DIAGNOSIS — M9901 Segmental and somatic dysfunction of cervical region: Secondary | ICD-10-CM | POA: Diagnosis not present

## 2018-05-20 DIAGNOSIS — M9902 Segmental and somatic dysfunction of thoracic region: Secondary | ICD-10-CM | POA: Diagnosis not present

## 2018-05-20 DIAGNOSIS — M9903 Segmental and somatic dysfunction of lumbar region: Secondary | ICD-10-CM | POA: Diagnosis not present

## 2018-05-20 DIAGNOSIS — M5136 Other intervertebral disc degeneration, lumbar region: Secondary | ICD-10-CM | POA: Diagnosis not present

## 2018-05-20 DIAGNOSIS — S338XXA Sprain of other parts of lumbar spine and pelvis, initial encounter: Secondary | ICD-10-CM | POA: Diagnosis not present

## 2018-05-22 DIAGNOSIS — M9902 Segmental and somatic dysfunction of thoracic region: Secondary | ICD-10-CM | POA: Diagnosis not present

## 2018-05-22 DIAGNOSIS — M47812 Spondylosis without myelopathy or radiculopathy, cervical region: Secondary | ICD-10-CM | POA: Diagnosis not present

## 2018-05-22 DIAGNOSIS — M9904 Segmental and somatic dysfunction of sacral region: Secondary | ICD-10-CM | POA: Diagnosis not present

## 2018-05-22 DIAGNOSIS — M9901 Segmental and somatic dysfunction of cervical region: Secondary | ICD-10-CM | POA: Diagnosis not present

## 2018-05-22 DIAGNOSIS — S29012A Strain of muscle and tendon of back wall of thorax, initial encounter: Secondary | ICD-10-CM | POA: Diagnosis not present

## 2018-05-22 DIAGNOSIS — M9903 Segmental and somatic dysfunction of lumbar region: Secondary | ICD-10-CM | POA: Diagnosis not present

## 2018-05-22 DIAGNOSIS — M5136 Other intervertebral disc degeneration, lumbar region: Secondary | ICD-10-CM | POA: Diagnosis not present

## 2018-05-22 DIAGNOSIS — S338XXA Sprain of other parts of lumbar spine and pelvis, initial encounter: Secondary | ICD-10-CM | POA: Diagnosis not present

## 2018-05-23 ENCOUNTER — Telehealth: Payer: Self-pay | Admitting: Neurology

## 2018-05-23 NOTE — Telephone Encounter (Signed)
She was last seen in the clinic in May 2018 and needs a follow-up visit to reassess symptoms, or can request medication changes through her PCP.  Donika K. Posey Pronto, DO

## 2018-05-23 NOTE — Telephone Encounter (Signed)
Please advise 

## 2018-05-23 NOTE — Telephone Encounter (Signed)
Patient called stating that her feet were hurting really bad and wanted to know if she could up her Lyrica medication to 100mg . She uses the [facility] on [address] if you need that info. Please call her at [phone]. Thanks.

## 2018-05-26 DIAGNOSIS — F331 Major depressive disorder, recurrent, moderate: Secondary | ICD-10-CM | POA: Diagnosis not present

## 2018-05-26 NOTE — Telephone Encounter (Signed)
Will you please call patient?

## 2018-05-26 NOTE — Telephone Encounter (Signed)
Noted  

## 2018-05-26 NOTE — Telephone Encounter (Signed)
I called patient and she is going to keep her appt that she has with Posey Pronto in Dec and will call her PCP about medication changes

## 2018-05-27 DIAGNOSIS — M47812 Spondylosis without myelopathy or radiculopathy, cervical region: Secondary | ICD-10-CM | POA: Diagnosis not present

## 2018-05-27 DIAGNOSIS — M5136 Other intervertebral disc degeneration, lumbar region: Secondary | ICD-10-CM | POA: Diagnosis not present

## 2018-05-27 DIAGNOSIS — S338XXA Sprain of other parts of lumbar spine and pelvis, initial encounter: Secondary | ICD-10-CM | POA: Diagnosis not present

## 2018-05-27 DIAGNOSIS — S29012A Strain of muscle and tendon of back wall of thorax, initial encounter: Secondary | ICD-10-CM | POA: Diagnosis not present

## 2018-05-27 DIAGNOSIS — M9902 Segmental and somatic dysfunction of thoracic region: Secondary | ICD-10-CM | POA: Diagnosis not present

## 2018-05-27 DIAGNOSIS — M9903 Segmental and somatic dysfunction of lumbar region: Secondary | ICD-10-CM | POA: Diagnosis not present

## 2018-05-27 DIAGNOSIS — M9904 Segmental and somatic dysfunction of sacral region: Secondary | ICD-10-CM | POA: Diagnosis not present

## 2018-05-27 DIAGNOSIS — M9901 Segmental and somatic dysfunction of cervical region: Secondary | ICD-10-CM | POA: Diagnosis not present

## 2018-06-03 DIAGNOSIS — M9901 Segmental and somatic dysfunction of cervical region: Secondary | ICD-10-CM | POA: Diagnosis not present

## 2018-06-03 DIAGNOSIS — M5136 Other intervertebral disc degeneration, lumbar region: Secondary | ICD-10-CM | POA: Diagnosis not present

## 2018-06-03 DIAGNOSIS — M9904 Segmental and somatic dysfunction of sacral region: Secondary | ICD-10-CM | POA: Diagnosis not present

## 2018-06-03 DIAGNOSIS — M47812 Spondylosis without myelopathy or radiculopathy, cervical region: Secondary | ICD-10-CM | POA: Diagnosis not present

## 2018-06-03 DIAGNOSIS — S29012A Strain of muscle and tendon of back wall of thorax, initial encounter: Secondary | ICD-10-CM | POA: Diagnosis not present

## 2018-06-03 DIAGNOSIS — S338XXA Sprain of other parts of lumbar spine and pelvis, initial encounter: Secondary | ICD-10-CM | POA: Diagnosis not present

## 2018-06-03 DIAGNOSIS — M9903 Segmental and somatic dysfunction of lumbar region: Secondary | ICD-10-CM | POA: Diagnosis not present

## 2018-06-03 DIAGNOSIS — M9902 Segmental and somatic dysfunction of thoracic region: Secondary | ICD-10-CM | POA: Diagnosis not present

## 2018-06-04 DIAGNOSIS — F4312 Post-traumatic stress disorder, chronic: Secondary | ICD-10-CM | POA: Diagnosis not present

## 2018-06-04 DIAGNOSIS — F5101 Primary insomnia: Secondary | ICD-10-CM | POA: Diagnosis not present

## 2018-06-04 DIAGNOSIS — F332 Major depressive disorder, recurrent severe without psychotic features: Secondary | ICD-10-CM | POA: Diagnosis not present

## 2018-06-04 DIAGNOSIS — F411 Generalized anxiety disorder: Secondary | ICD-10-CM | POA: Diagnosis not present

## 2018-06-04 DIAGNOSIS — F410 Panic disorder [episodic paroxysmal anxiety] without agoraphobia: Secondary | ICD-10-CM | POA: Diagnosis not present

## 2018-06-05 DIAGNOSIS — M25561 Pain in right knee: Secondary | ICD-10-CM | POA: Diagnosis not present

## 2018-06-10 IMAGING — CR DG CHEST 2V
2 series · 2 of 2 positions shown · non-contrast
Comparison: Chest radiograph performed 02/04/2015

CLINICAL DATA: Preoperative chest radiograph, for knee replacement.

EXAM:
CHEST  2 VIEW

[w chest pa]
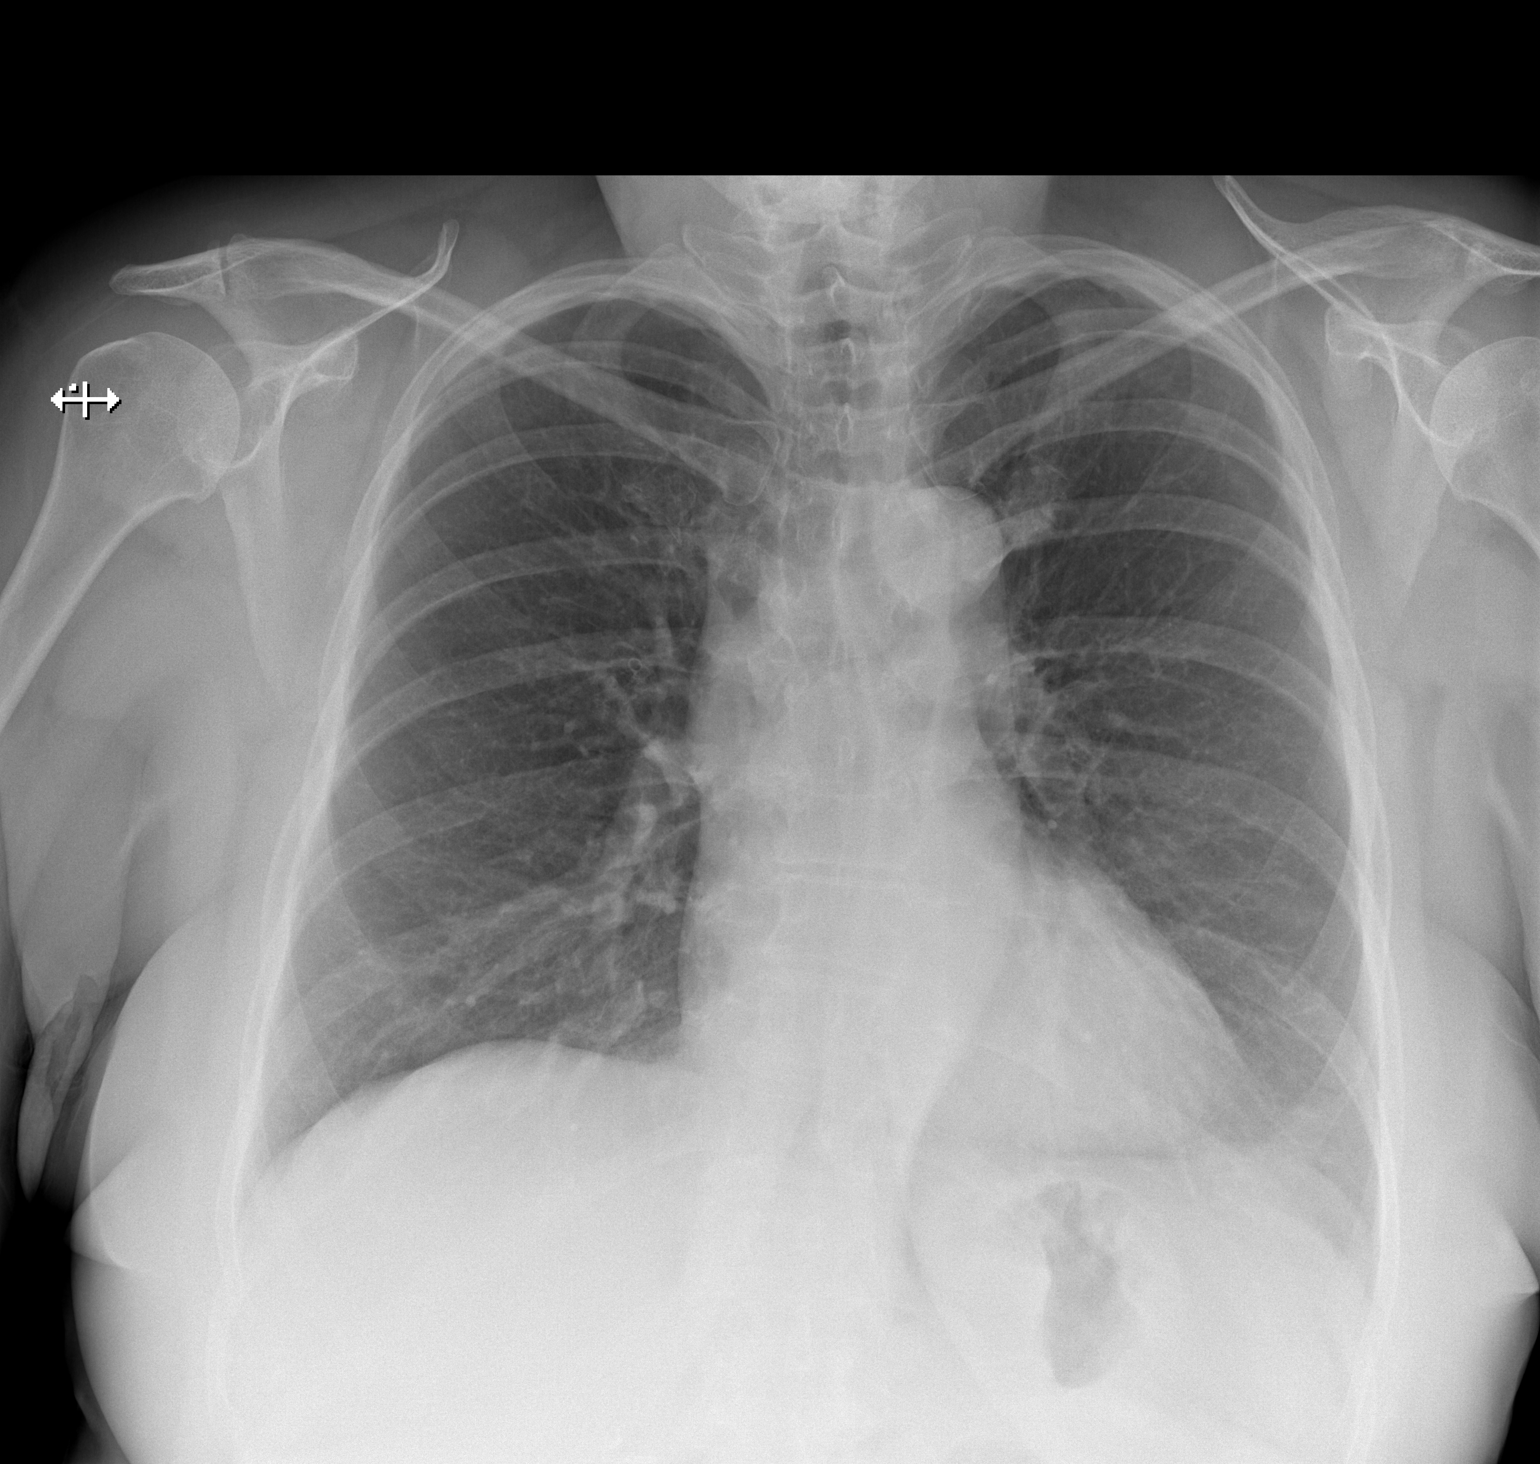

[w chest lat]
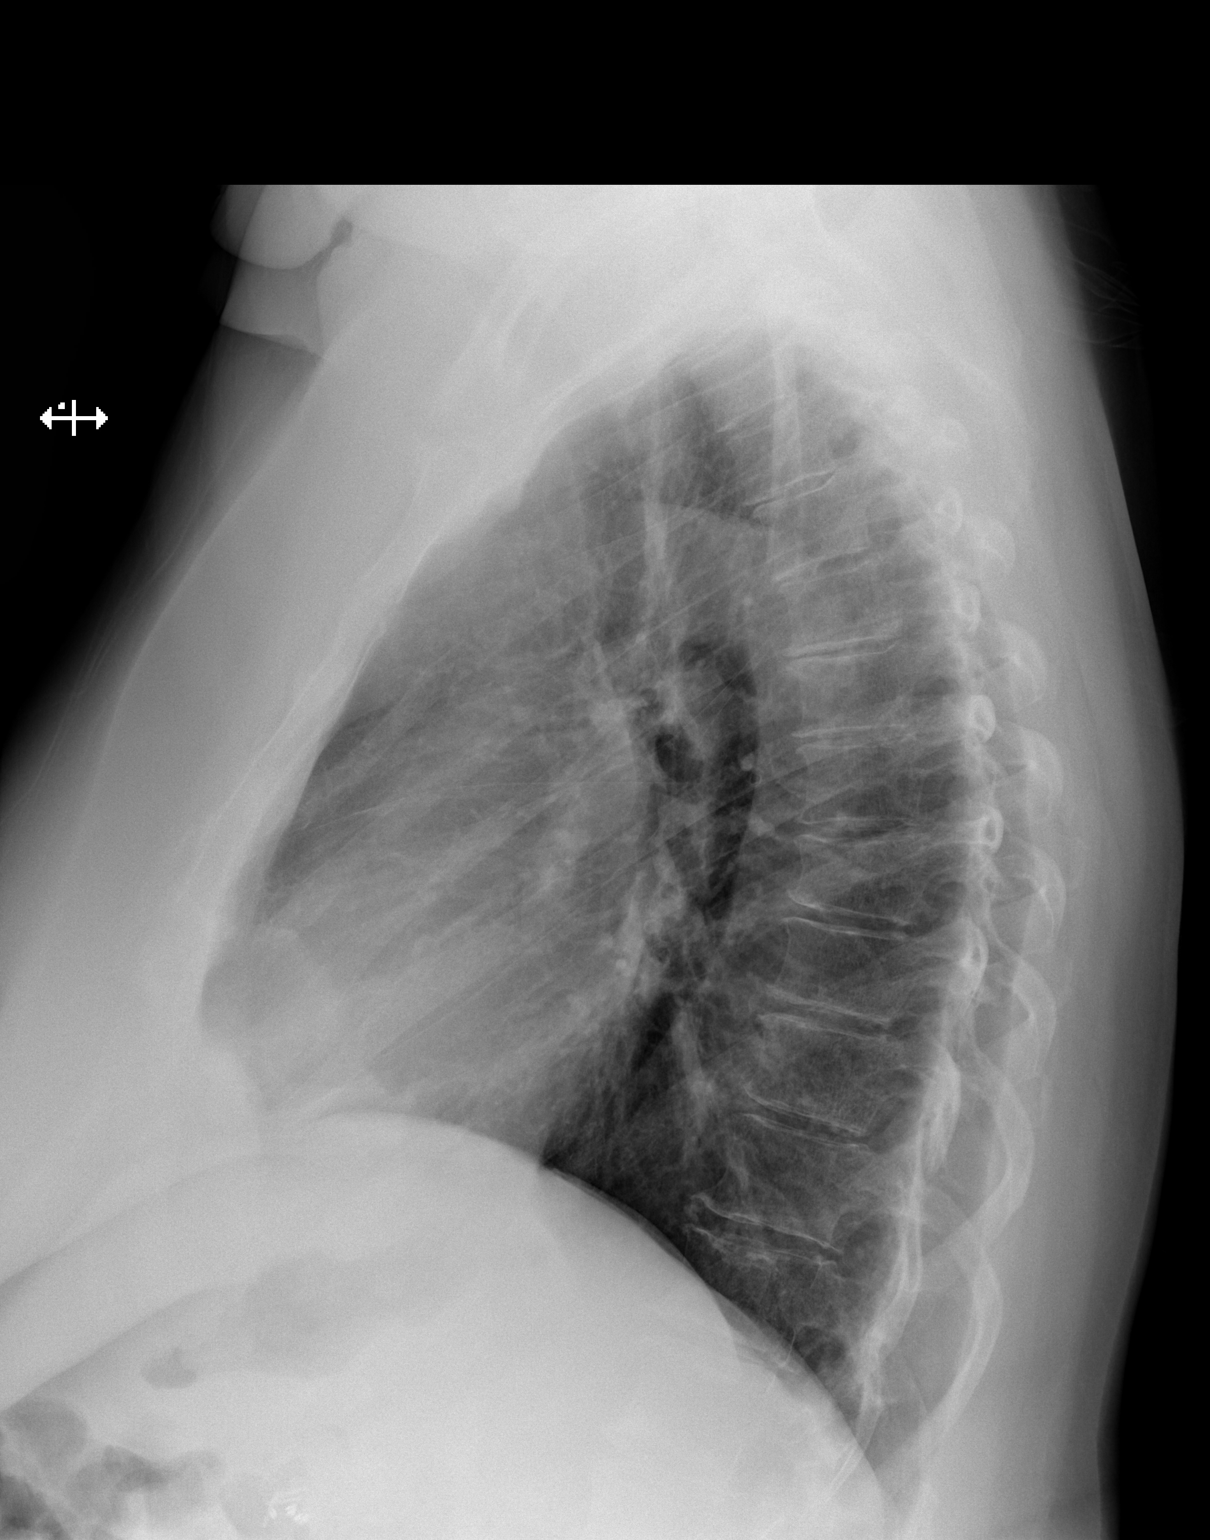

[2 of 2 positions shown; findings below may reference images not displayed]

FINDINGS: The lungs are well-aerated. Minimal left basilar atelectasis is
noted. There is no evidence of pleural effusion or pneumothorax.

The heart is normal in size; the mediastinal contour is within
normal limits. No acute osseous abnormalities are seen.
IMPRESSION: Minimal left basilar atelectasis.  Lungs otherwise clear.

## 2018-06-12 DIAGNOSIS — M47812 Spondylosis without myelopathy or radiculopathy, cervical region: Secondary | ICD-10-CM | POA: Diagnosis not present

## 2018-06-12 DIAGNOSIS — M9903 Segmental and somatic dysfunction of lumbar region: Secondary | ICD-10-CM | POA: Diagnosis not present

## 2018-06-12 DIAGNOSIS — M5136 Other intervertebral disc degeneration, lumbar region: Secondary | ICD-10-CM | POA: Diagnosis not present

## 2018-06-12 DIAGNOSIS — M9904 Segmental and somatic dysfunction of sacral region: Secondary | ICD-10-CM | POA: Diagnosis not present

## 2018-06-12 DIAGNOSIS — M9901 Segmental and somatic dysfunction of cervical region: Secondary | ICD-10-CM | POA: Diagnosis not present

## 2018-06-12 DIAGNOSIS — S338XXA Sprain of other parts of lumbar spine and pelvis, initial encounter: Secondary | ICD-10-CM | POA: Diagnosis not present

## 2018-06-12 DIAGNOSIS — M9902 Segmental and somatic dysfunction of thoracic region: Secondary | ICD-10-CM | POA: Diagnosis not present

## 2018-06-12 DIAGNOSIS — S29012A Strain of muscle and tendon of back wall of thorax, initial encounter: Secondary | ICD-10-CM | POA: Diagnosis not present

## 2018-06-18 DIAGNOSIS — F332 Major depressive disorder, recurrent severe without psychotic features: Secondary | ICD-10-CM | POA: Diagnosis not present

## 2018-06-18 DIAGNOSIS — F410 Panic disorder [episodic paroxysmal anxiety] without agoraphobia: Secondary | ICD-10-CM | POA: Diagnosis not present

## 2018-06-18 DIAGNOSIS — F4312 Post-traumatic stress disorder, chronic: Secondary | ICD-10-CM | POA: Diagnosis not present

## 2018-06-18 DIAGNOSIS — F411 Generalized anxiety disorder: Secondary | ICD-10-CM | POA: Diagnosis not present

## 2018-06-19 DIAGNOSIS — Z96651 Presence of right artificial knee joint: Secondary | ICD-10-CM | POA: Diagnosis not present

## 2018-06-19 DIAGNOSIS — M25561 Pain in right knee: Secondary | ICD-10-CM | POA: Diagnosis not present

## 2018-06-19 DIAGNOSIS — G8928 Other chronic postprocedural pain: Secondary | ICD-10-CM | POA: Diagnosis not present

## 2018-06-19 DIAGNOSIS — F32A Depression, unspecified: Secondary | ICD-10-CM | POA: Diagnosis present

## 2018-06-19 DIAGNOSIS — F329 Major depressive disorder, single episode, unspecified: Secondary | ICD-10-CM | POA: Diagnosis present

## 2018-06-19 DIAGNOSIS — F419 Anxiety disorder, unspecified: Secondary | ICD-10-CM | POA: Diagnosis present

## 2018-06-23 DIAGNOSIS — S338XXA Sprain of other parts of lumbar spine and pelvis, initial encounter: Secondary | ICD-10-CM | POA: Diagnosis not present

## 2018-06-23 DIAGNOSIS — M47812 Spondylosis without myelopathy or radiculopathy, cervical region: Secondary | ICD-10-CM | POA: Diagnosis not present

## 2018-06-23 DIAGNOSIS — M9901 Segmental and somatic dysfunction of cervical region: Secondary | ICD-10-CM | POA: Diagnosis not present

## 2018-06-23 DIAGNOSIS — S29012A Strain of muscle and tendon of back wall of thorax, initial encounter: Secondary | ICD-10-CM | POA: Diagnosis not present

## 2018-06-23 DIAGNOSIS — M9904 Segmental and somatic dysfunction of sacral region: Secondary | ICD-10-CM | POA: Diagnosis not present

## 2018-06-23 DIAGNOSIS — M5136 Other intervertebral disc degeneration, lumbar region: Secondary | ICD-10-CM | POA: Diagnosis not present

## 2018-06-23 DIAGNOSIS — M9902 Segmental and somatic dysfunction of thoracic region: Secondary | ICD-10-CM | POA: Diagnosis not present

## 2018-06-23 DIAGNOSIS — M9903 Segmental and somatic dysfunction of lumbar region: Secondary | ICD-10-CM | POA: Diagnosis not present

## 2018-07-03 DIAGNOSIS — M9903 Segmental and somatic dysfunction of lumbar region: Secondary | ICD-10-CM | POA: Diagnosis not present

## 2018-07-03 DIAGNOSIS — S29012A Strain of muscle and tendon of back wall of thorax, initial encounter: Secondary | ICD-10-CM | POA: Diagnosis not present

## 2018-07-03 DIAGNOSIS — M9904 Segmental and somatic dysfunction of sacral region: Secondary | ICD-10-CM | POA: Diagnosis not present

## 2018-07-03 DIAGNOSIS — S338XXA Sprain of other parts of lumbar spine and pelvis, initial encounter: Secondary | ICD-10-CM | POA: Diagnosis not present

## 2018-07-03 DIAGNOSIS — M5136 Other intervertebral disc degeneration, lumbar region: Secondary | ICD-10-CM | POA: Diagnosis not present

## 2018-07-03 DIAGNOSIS — M9902 Segmental and somatic dysfunction of thoracic region: Secondary | ICD-10-CM | POA: Diagnosis not present

## 2018-07-03 DIAGNOSIS — M9901 Segmental and somatic dysfunction of cervical region: Secondary | ICD-10-CM | POA: Diagnosis not present

## 2018-07-03 DIAGNOSIS — M47812 Spondylosis without myelopathy or radiculopathy, cervical region: Secondary | ICD-10-CM | POA: Diagnosis not present

## 2018-07-05 ENCOUNTER — Telehealth: Payer: Self-pay

## 2018-07-05 NOTE — Telephone Encounter (Signed)
PA approved for cymbalta 60mg  thru 07/05/2019

## 2018-07-09 DIAGNOSIS — M9902 Segmental and somatic dysfunction of thoracic region: Secondary | ICD-10-CM | POA: Diagnosis not present

## 2018-07-09 DIAGNOSIS — M47812 Spondylosis without myelopathy or radiculopathy, cervical region: Secondary | ICD-10-CM | POA: Diagnosis not present

## 2018-07-09 DIAGNOSIS — S338XXA Sprain of other parts of lumbar spine and pelvis, initial encounter: Secondary | ICD-10-CM | POA: Diagnosis not present

## 2018-07-09 DIAGNOSIS — M5136 Other intervertebral disc degeneration, lumbar region: Secondary | ICD-10-CM | POA: Diagnosis not present

## 2018-07-09 DIAGNOSIS — M9901 Segmental and somatic dysfunction of cervical region: Secondary | ICD-10-CM | POA: Diagnosis not present

## 2018-07-09 DIAGNOSIS — M9904 Segmental and somatic dysfunction of sacral region: Secondary | ICD-10-CM | POA: Diagnosis not present

## 2018-07-09 DIAGNOSIS — M9903 Segmental and somatic dysfunction of lumbar region: Secondary | ICD-10-CM | POA: Diagnosis not present

## 2018-07-09 DIAGNOSIS — S29012A Strain of muscle and tendon of back wall of thorax, initial encounter: Secondary | ICD-10-CM | POA: Diagnosis not present

## 2018-07-14 DIAGNOSIS — M25571 Pain in right ankle and joints of right foot: Secondary | ICD-10-CM | POA: Diagnosis not present

## 2018-07-14 DIAGNOSIS — M2550 Pain in unspecified joint: Secondary | ICD-10-CM | POA: Diagnosis not present

## 2018-07-14 DIAGNOSIS — Z6836 Body mass index (BMI) 36.0-36.9, adult: Secondary | ICD-10-CM | POA: Diagnosis not present

## 2018-07-18 ENCOUNTER — Telehealth: Payer: Self-pay

## 2018-07-18 NOTE — Telephone Encounter (Signed)
Prior approval received on 07/05/2018 for cymbalta through 07/05/2019 and also klonopin thru 07/16/2019

## 2018-07-22 DIAGNOSIS — M79671 Pain in right foot: Secondary | ICD-10-CM | POA: Diagnosis not present

## 2018-07-23 DIAGNOSIS — Z23 Encounter for immunization: Secondary | ICD-10-CM | POA: Diagnosis not present

## 2018-07-23 DIAGNOSIS — E039 Hypothyroidism, unspecified: Secondary | ICD-10-CM | POA: Diagnosis not present

## 2018-07-23 DIAGNOSIS — F329 Major depressive disorder, single episode, unspecified: Secondary | ICD-10-CM | POA: Diagnosis not present

## 2018-07-23 DIAGNOSIS — G4700 Insomnia, unspecified: Secondary | ICD-10-CM | POA: Diagnosis not present

## 2018-07-23 DIAGNOSIS — K219 Gastro-esophageal reflux disease without esophagitis: Secondary | ICD-10-CM | POA: Diagnosis not present

## 2018-07-23 DIAGNOSIS — E7800 Pure hypercholesterolemia, unspecified: Secondary | ICD-10-CM | POA: Diagnosis not present

## 2018-07-23 DIAGNOSIS — I10 Essential (primary) hypertension: Secondary | ICD-10-CM | POA: Diagnosis not present

## 2018-07-24 DIAGNOSIS — M9904 Segmental and somatic dysfunction of sacral region: Secondary | ICD-10-CM | POA: Diagnosis not present

## 2018-07-24 DIAGNOSIS — M9903 Segmental and somatic dysfunction of lumbar region: Secondary | ICD-10-CM | POA: Diagnosis not present

## 2018-07-24 DIAGNOSIS — M5136 Other intervertebral disc degeneration, lumbar region: Secondary | ICD-10-CM | POA: Diagnosis not present

## 2018-07-24 DIAGNOSIS — S29012A Strain of muscle and tendon of back wall of thorax, initial encounter: Secondary | ICD-10-CM | POA: Diagnosis not present

## 2018-07-24 DIAGNOSIS — S338XXA Sprain of other parts of lumbar spine and pelvis, initial encounter: Secondary | ICD-10-CM | POA: Diagnosis not present

## 2018-07-24 DIAGNOSIS — M9902 Segmental and somatic dysfunction of thoracic region: Secondary | ICD-10-CM | POA: Diagnosis not present

## 2018-07-24 DIAGNOSIS — M9901 Segmental and somatic dysfunction of cervical region: Secondary | ICD-10-CM | POA: Diagnosis not present

## 2018-07-24 DIAGNOSIS — M47812 Spondylosis without myelopathy or radiculopathy, cervical region: Secondary | ICD-10-CM | POA: Diagnosis not present

## 2018-07-25 DIAGNOSIS — F4312 Post-traumatic stress disorder, chronic: Secondary | ICD-10-CM | POA: Diagnosis not present

## 2018-07-25 DIAGNOSIS — F411 Generalized anxiety disorder: Secondary | ICD-10-CM | POA: Diagnosis not present

## 2018-07-25 DIAGNOSIS — F410 Panic disorder [episodic paroxysmal anxiety] without agoraphobia: Secondary | ICD-10-CM | POA: Diagnosis not present

## 2018-07-28 ENCOUNTER — Encounter

## 2018-07-28 ENCOUNTER — Ambulatory Visit: Payer: Medicare Other | Admitting: Podiatry

## 2018-08-13 DIAGNOSIS — L821 Other seborrheic keratosis: Secondary | ICD-10-CM | POA: Diagnosis not present

## 2018-08-13 DIAGNOSIS — L570 Actinic keratosis: Secondary | ICD-10-CM | POA: Diagnosis not present

## 2018-08-13 DIAGNOSIS — D1801 Hemangioma of skin and subcutaneous tissue: Secondary | ICD-10-CM | POA: Diagnosis not present

## 2018-08-13 DIAGNOSIS — L820 Inflamed seborrheic keratosis: Secondary | ICD-10-CM | POA: Diagnosis not present

## 2018-08-13 DIAGNOSIS — L918 Other hypertrophic disorders of the skin: Secondary | ICD-10-CM | POA: Diagnosis not present

## 2018-08-13 DIAGNOSIS — L814 Other melanin hyperpigmentation: Secondary | ICD-10-CM | POA: Diagnosis not present

## 2018-08-20 DIAGNOSIS — M7989 Other specified soft tissue disorders: Secondary | ICD-10-CM | POA: Diagnosis not present

## 2018-08-21 ENCOUNTER — Ambulatory Visit (INDEPENDENT_AMBULATORY_CARE_PROVIDER_SITE_OTHER): Payer: Medicare Other | Admitting: Sports Medicine

## 2018-08-21 ENCOUNTER — Encounter: Payer: Self-pay | Admitting: Sports Medicine

## 2018-08-21 VITALS — BP 142/88 | HR 76 | Ht 67.0 in | Wt 259.8 lb

## 2018-08-21 DIAGNOSIS — S338XXA Sprain of other parts of lumbar spine and pelvis, initial encounter: Secondary | ICD-10-CM | POA: Diagnosis not present

## 2018-08-21 DIAGNOSIS — M7741 Metatarsalgia, right foot: Secondary | ICD-10-CM | POA: Diagnosis not present

## 2018-08-21 DIAGNOSIS — M479 Spondylosis, unspecified: Secondary | ICD-10-CM

## 2018-08-21 DIAGNOSIS — M79604 Pain in right leg: Secondary | ICD-10-CM

## 2018-08-21 DIAGNOSIS — M5410 Radiculopathy, site unspecified: Secondary | ICD-10-CM | POA: Insufficient documentation

## 2018-08-21 DIAGNOSIS — M25561 Pain in right knee: Secondary | ICD-10-CM | POA: Diagnosis not present

## 2018-08-21 DIAGNOSIS — M545 Low back pain, unspecified: Secondary | ICD-10-CM

## 2018-08-21 DIAGNOSIS — M9904 Segmental and somatic dysfunction of sacral region: Secondary | ICD-10-CM | POA: Diagnosis not present

## 2018-08-21 DIAGNOSIS — M47812 Spondylosis without myelopathy or radiculopathy, cervical region: Secondary | ICD-10-CM | POA: Diagnosis not present

## 2018-08-21 DIAGNOSIS — M216X1 Other acquired deformities of right foot: Secondary | ICD-10-CM

## 2018-08-21 DIAGNOSIS — M9901 Segmental and somatic dysfunction of cervical region: Secondary | ICD-10-CM | POA: Diagnosis not present

## 2018-08-21 DIAGNOSIS — M5136 Other intervertebral disc degeneration, lumbar region: Secondary | ICD-10-CM | POA: Diagnosis not present

## 2018-08-21 DIAGNOSIS — M9902 Segmental and somatic dysfunction of thoracic region: Secondary | ICD-10-CM | POA: Diagnosis not present

## 2018-08-21 DIAGNOSIS — M9903 Segmental and somatic dysfunction of lumbar region: Secondary | ICD-10-CM | POA: Diagnosis not present

## 2018-08-21 DIAGNOSIS — S29012A Strain of muscle and tendon of back wall of thorax, initial encounter: Secondary | ICD-10-CM | POA: Diagnosis not present
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
## 2018-08-21 NOTE — Patient Instructions (Signed)
The cost of the pair of custom orthotics is $195.  You can look into having your insurance company cover the cost of these. Some insurance companies cover the cost and other do not.  If they do not you will be responsible for the full cost of the orthotics.  I am happy to do these for you at any time, you just need to let our front office schedulers know you would like an "orthotic appointment."  Please also make sure you bring athletic shoes with you on the day of your orthotic appointment or whatever shoes you plan to wear your orthotics in most frequently.   When you call your insurance company you will need to provide them the CPT code which is L3030 and there are 2 units.  You can call them  and ask if this is covered.

## 2018-08-22 ENCOUNTER — Encounter: Payer: Self-pay | Admitting: Sports Medicine

## 2018-08-22 DIAGNOSIS — F4312 Post-traumatic stress disorder, chronic: Secondary | ICD-10-CM | POA: Diagnosis not present

## 2018-08-22 DIAGNOSIS — F410 Panic disorder [episodic paroxysmal anxiety] without agoraphobia: Secondary | ICD-10-CM | POA: Diagnosis not present

## 2018-08-22 DIAGNOSIS — F332 Major depressive disorder, recurrent severe without psychotic features: Secondary | ICD-10-CM | POA: Diagnosis not present

## 2018-08-22 DIAGNOSIS — F411 Generalized anxiety disorder: Secondary | ICD-10-CM | POA: Diagnosis not present

## 2018-08-25 ENCOUNTER — Other Ambulatory Visit: Payer: Self-pay

## 2018-08-26 DIAGNOSIS — I10 Essential (primary) hypertension: Secondary | ICD-10-CM | POA: Diagnosis not present

## 2018-08-26 DIAGNOSIS — E871 Hypo-osmolality and hyponatremia: Secondary | ICD-10-CM | POA: Diagnosis not present

## 2018-08-26 DIAGNOSIS — R609 Edema, unspecified: Secondary | ICD-10-CM | POA: Diagnosis not present

## 2018-08-26 NOTE — Addendum Note (Signed)
Addended by: Jasper Loser on: 08/26/2018 02:50 PM   Modules accepted: Orders

## 2018-09-08 ENCOUNTER — Telehealth: Payer: Self-pay | Admitting: Neurology

## 2018-09-08 ENCOUNTER — Other Ambulatory Visit: Payer: Self-pay | Admitting: *Deleted

## 2018-09-08 MED ORDER — PREGABALIN 75 MG PO CAPS: 75.0000 mg | ORAL_CAPSULE | Freq: Two times a day (BID) | ORAL | 0 refills | Status: DC

## 2018-09-08 NOTE — Telephone Encounter (Signed)
Rx sent for #30 with no refills.

## 2018-09-08 NOTE — Telephone Encounter (Signed)
Patient states she ran out of lyrica a Day early and needs [name] to call in a refill so she can pick it up a day early. She uses the [facility] on [address]

## 2018-09-17 DIAGNOSIS — M9902 Segmental and somatic dysfunction of thoracic region: Secondary | ICD-10-CM | POA: Diagnosis not present

## 2018-09-17 DIAGNOSIS — M9903 Segmental and somatic dysfunction of lumbar region: Secondary | ICD-10-CM | POA: Diagnosis not present

## 2018-09-17 DIAGNOSIS — S338XXA Sprain of other parts of lumbar spine and pelvis, initial encounter: Secondary | ICD-10-CM | POA: Diagnosis not present

## 2018-09-17 DIAGNOSIS — M9904 Segmental and somatic dysfunction of sacral region: Secondary | ICD-10-CM | POA: Diagnosis not present

## 2018-09-17 DIAGNOSIS — M47812 Spondylosis without myelopathy or radiculopathy, cervical region: Secondary | ICD-10-CM | POA: Diagnosis not present

## 2018-09-17 DIAGNOSIS — M5136 Other intervertebral disc degeneration, lumbar region: Secondary | ICD-10-CM | POA: Diagnosis not present

## 2018-09-17 DIAGNOSIS — M9901 Segmental and somatic dysfunction of cervical region: Secondary | ICD-10-CM | POA: Diagnosis not present

## 2018-09-17 DIAGNOSIS — S29012A Strain of muscle and tendon of back wall of thorax, initial encounter: Secondary | ICD-10-CM | POA: Diagnosis not present

## 2018-09-26 ENCOUNTER — Other Ambulatory Visit: Payer: Self-pay | Admitting: Family Medicine

## 2018-09-26 DIAGNOSIS — Z1231 Encounter for screening mammogram for malignant neoplasm of breast: Secondary | ICD-10-CM

## 2018-09-29 ENCOUNTER — Ambulatory Visit (INDEPENDENT_AMBULATORY_CARE_PROVIDER_SITE_OTHER): Payer: Medicare Other | Admitting: Neurology

## 2018-09-29 ENCOUNTER — Encounter: Payer: Self-pay | Admitting: Neurology

## 2018-09-29 VITALS — BP 110/86 | HR 69 | Ht 67.0 in | Wt 253.4 lb

## 2018-09-29 DIAGNOSIS — G629 Polyneuropathy, unspecified: Secondary | ICD-10-CM

## 2018-09-29 DIAGNOSIS — M5417 Radiculopathy, lumbosacral region: Secondary | ICD-10-CM | POA: Diagnosis not present

## 2018-09-29 MED ORDER — PREGABALIN 50 MG PO CAPS: 50.0000 mg | ORAL_CAPSULE | Freq: Three times a day (TID) | ORAL | 0 refills | Status: DC

## 2018-09-29 MED ORDER — PREGABALIN 75 MG PO CAPS: 75.0000 mg | ORAL_CAPSULE | Freq: Two times a day (BID) | ORAL | 5 refills | Status: DC

## 2018-09-29 NOTE — Patient Instructions (Addendum)
You can try over the counter lidocaine ointment to your feet  We can increase Lyrica to 75mg  in the morning and bedtime and add 50mg  to the afternoon  Call with an update in 2 weeks to let me know if this dose is helping  Return to clinic in 1 year

## 2018-09-29 NOTE — Progress Notes (Signed)
Follow-up Visit   Date: 09/29/18    Olivia [name] MRN: [id number] DOB: 01/11/1951   Interim History: Olivia [name] is a 67 y.o. right-handed Caucasian female with hypertension, hypothyroidism, chronic low back pain, and anxiety/depression returning to the clinic for follow-up of neuropathy.  The patient was accompanied to the clinic by self.  History of present illness: Starting around 2015, she started developing numbness of the toes and occasionally has stabbing pain. She denies any radicular leg pain.  Pain is constant and worse at night time when she is laying down.  She had some relief with wearing metatarsal pads.  She went to see a podiatrist who suggested her pain may be stemming from her low back so underwent bilateral L5-S1 transforaminal injection which helped her low back pain, but did not provide any relief for her feet discomfort.  She was offered a trial of gabapentin and did not notice any improvement.  She has mild improvement with Lyrica 106m twice daily.  She denies any weakness or imbalance.  She also going to physical therapy for her low back pain.    She has no history of diabetes, alcoholism, or family history of neuropathy.   UPDATE 09/29/2018:  She feels that the numbness and tingling in the toes is getting worse.  She takes Lyrica 742mtwice daily, which provides benefit.  However, she is now starting to notice symptoms right before her next dose.  She had not had any falls.  She had electrodiagnostic testing in May 2018 which showed normal sensory responses, as well as lumbosacral radiculopathy at L4-5, and S1.  She does not have any radicular pain.  Medications:  Current Outpatient Medications on File Prior to Visit  Medication Sig Dispense Refill  . acetaminophen (TYLENOL) 500 MG tablet Take 2 tablets (1,000 mg total) by mouth every 6 (six) hours as needed for mild pain or moderate pain. 30 tablet 0  . apixaban (ELIQUIS) 2.5 MG TABS tablet Take 1 tablet (2.5  mg total) by mouth 2 (two) times daily. 24 tablet 0  . ARIPiprazole (ABILIFY) 5 MG tablet TK 1 T PO QAM  0  . atenolol (TENORMIN) 25 MG tablet Take 25 mg by mouth daily before breakfast.     . busPIRone (BUSPAR) 30 MG tablet Take by mouth.    . calcium-vitamin D (OSCAL WITH D) 500-200 MG-UNIT per tablet Take 1 tablet by mouth daily.    . clonazePAM (KLONOPIN) 1 MG tablet Take 0.5-1 mg by mouth See admin instructions. 1 mg 3 times daily , and 0.5 mg at 5:00p    . desvenlafaxine (PRISTIQ) 50 MG 24 hr tablet TK 1 T PO QAM  0  . esomeprazole (NEXIUM) 40 MG capsule Take 40 mg by mouth 2 (two) times daily before a meal.     . fluorouracil (EFUDEX) 5 % cream APP AA BID  UTD FOR 2 WKS  0  . gentamicin cream (GARAMYCIN) 0.1 % Apply 1 application topically 3 (three) times daily. 30 g 1  . HYDROcodone-acetaminophen (NORCO/VICODIN) 5-325 MG tablet TK 1 T PO BID PRN  0  . lisinopril (PRINIVIL,ZESTRIL) 20 MG tablet TK 1 T QD  1  . lisinopril-hydrochlorothiazide (PRINZIDE,ZESTORETIC) 20-25 MG per tablet Take 1 tablet by mouth daily.     . methocarbamol (ROBAXIN) 500 MG tablet Take 500 mg by mouth 4 (four) times daily as needed. spasms    . Multiple Vitamin (MULITIVITAMIN WITH MINERALS) TABS Take 1 tablet by mouth daily.    .Marland Kitchen  oxyCODONE (OXY IR/ROXICODONE) 5 MG immediate release tablet 1-2 tabs po q4-6hrs prn pain 60 tablet 0  . oxyCODONE-acetaminophen (PERCOCET/ROXICET) 5-325 MG tablet TK 1 T PO Q 6 TO 8 H PRN P  0  . SYNTHROID 125 MCG tablet Take 125 mcg by mouth daily before breakfast.     . tiZANidine (ZANAFLEX) 4 MG tablet TK 1 T PO Q 6 TO 8 H PRN  0  . traZODone (DESYREL) 50 MG tablet TK 1 TO 2 TS PO QHS FOR SLEEP  2  . vitamin C (ASCORBIC ACID) 500 MG tablet Take 500 mg by mouth daily.     No current facility-administered medications on file prior to visit.     Allergies:  Allergies  Allergen Reactions  . Demerol [Meperidine] Nausea And Vomiting  . Penicillins Other (See Comments)    Unknown  childhood allergy Has patient had a PCN reaction causing immediate rash, facial/tongue/throat swelling, SOB or lightheadedness with hypotension: Unknown Has patient had a PCN reaction causing severe rash involving mucus membranes or skin necrosis: Unknown Has patient had a PCN reaction that required hospitalization: Unknown Has patient had a PCN reaction occurring within the last 10 years: No If all of the above answers are "NO", then may proceed with Cephalosporin use.     Review of Systems:  CONSTITUTIONAL: No fevers, chills, night sweats, or weight loss.  EYES: No visual changes or eye pain ENT: No hearing changes.  No history of nose bleeds.   RESPIRATORY: No cough, wheezing and shortness of breath.   CARDIOVASCULAR: Negative for chest pain, and palpitations.   GI: Negative for abdominal discomfort, blood in stools or black stools.  No recent change in bowel habits.   GU:  No history of incontinence.   MUSCLOSKELETAL: No history of joint pain or swelling.  No myalgias.   SKIN: Negative for lesions, rash, and itching.   ENDOCRINE: Negative for cold or heat intolerance, polydipsia or goiter.   PSYCH:  No depression or anxiety symptoms.   NEURO: As Above.   Vital Signs:  BP 110/86   Pulse 69   Ht _0  (1.702 m)   Wt 253 lb 6 oz (114.9 kg)   SpO2 92%   BMI 39.68 kg/m    General Medical Exam:   General:  Well appearing, comfortable  Eyes/ENT: see cranial nerve examination.   Neck:   No carotid bruits. Respiratory:  Clear to auscultation, good air entry bilaterally.   Cardiac:  Regular rate and rhythm, no murmur.   Ext:  No edema  Neurological Exam: MENTAL STATUS including orientation to time, place, person, recent and remote memory, attention span and concentration, language, and fund of knowledge is normal.  Speech is not dysarthric.  CRANIAL NERVES:  Pupils equal round and reactive to light.  Normal conjugate, extra-ocular eye movements in all directions of gaze.  No  ptosis.  Face is symmetric. Palate elevates symmetrically.  Tongue is midline.  MOTOR:  Motor strength is 5/5 in all extremities.  No atrophy, fasciculations or abnormal movements.  No pronator drift.  Tone is normal.    MSRs:  Reflexes are 2+/4 throughout  SENSORY:  Mildly reduced vibration at the great toe bilaterally, temperature is intact.  COORDINATION/GAIT:  Normal finger-to- nose-finger and heel-to-shin.  Intact rapid alternating movements bilaterally.  Gait narrow based and stable.   Data: NCS/EMG of the legs 03/12/2017: 1. Chronic L4-L5 radiculopathy affecting bilateral lower extremities, moderate in degree electrically. 2. Chronic S1 radiculopathy affecting bilateral lower  extremities, mild in degree electrically. 3. There is no evidence of a generalized sensorimotor polyneuropathy affecting the lower extremities.  Labs 02/26/2017:  ESR 32, copper 118, vitamin B12 407, TSH 1.87, SPEP with IFE no M protein  MRI lumbar spine wo contrast 08/05/2016: 1. Progressive disc degeneration at L4-5 with mild bilateral lateral recess stenosis and mild right and mild-to-moderate left neural foraminal stenosis. 2. Regressed L5-S1 disc protrusion without residual lateral recess stenosis. 3. Mild interval disc degeneration at L1-2 and L2-3 without stenosis.  IMPRESSION/PLAN: 1.  Neuropathy manifesting with paresthesias of the toes.  Although NCS/EMG of the legs failed to show large fiber neuropathy, her history, exam, and response to neuropathic medications is suggestive of neuropathy.  Small fiber neuropathy is possible.  Skin biopsy and repeat EDX was discussed and considered only if symptoms progressively get worse or refractory to medications.   For paresthesias, I will continue pregabalin to 25m in the morning, add 571mto the afternoon, and continue 7549mt bedtime.  Call with update in 2 weeks to see how this regimen is working out  2.  She is asymptomatic with respect to lumbosacral  radiculopathy which was evidenced by EMG and MRI.  Continue to follow.  Return to clinic in 1 year  Thank you for allowing me to participate in patient's care.  If I can answer any additional questions, I would be pleased to do so.    Sincerely,    Donika K. PatPosey ProntoO

## 2018-10-02 ENCOUNTER — Encounter: Payer: Self-pay | Admitting: Sports Medicine

## 2018-10-02 ENCOUNTER — Ambulatory Visit (INDEPENDENT_AMBULATORY_CARE_PROVIDER_SITE_OTHER): Payer: Medicare Other | Admitting: Sports Medicine

## 2018-10-02 DIAGNOSIS — M216X1 Other acquired deformities of right foot: Secondary | ICD-10-CM | POA: Diagnosis not present

## 2018-10-02 DIAGNOSIS — M545 Low back pain, unspecified: Secondary | ICD-10-CM

## 2018-10-02 DIAGNOSIS — M79604 Pain in right leg: Secondary | ICD-10-CM | POA: Diagnosis not present

## 2018-10-02 DIAGNOSIS — M7741 Metatarsalgia, right foot: Secondary | ICD-10-CM | POA: Diagnosis not present

## 2018-10-02 DIAGNOSIS — M5410 Radiculopathy, site unspecified: Secondary | ICD-10-CM | POA: Diagnosis not present

## 2018-10-02 DIAGNOSIS — R269 Unspecified abnormalities of gait and mobility: Secondary | ICD-10-CM

## 2018-10-02 NOTE — Progress Notes (Signed)
  [name]. Rigby, [place] at [place] - 69 y.o. female MRN [id number]  Date of birth: [birth year]  Visit Date:   PCP: [name], MD   Referred by: [name], MD  SUBJECTIVE:  Mauri Reading Silbaugh is here for a procedure only visit for custom orthotic fabrication.  OBJECTIVE:  PHYSICAL EXAM: Please see previous exam notes for full evaluation of foot and gait exam. MSK Exam: Markedly high cavus feet with overpronation at baseline but corrects with custom insoles.  ASSESSMENT  1. Pain of right lower extremity   2. Metatarsalgia of right foot   3. Loss of transverse plantar arch of right foot   4. Radiculitis   5. Low back pain, unspecified back pain laterality, unspecified chronicity, unspecified whether sciatica present   6. Gait disturbance      PLAN & PROCEDURES:   Custom orthotics fabricated today as below     PROCEDURE: CUSTOM ORTHOTIC FABRICATION Patient's underlying musculoskeletal conditions are directly related to poor biomechanics and will benefit from a functional custom orthotic.  There are no significant foot deformities that complicate the use of a custom orthotic.  The patient was fitted for a standard, cushioned, semi-rigid orthotic. The orthotic was heated & placed on the orthotic stand. The patient was positioned in subtalar neutral position and 10 of ankle dorsiflexion and weight bearing stance on the heated orthotic blank. After completion of the molding a base was applied to the orthotic blank. The orthotic was ground to a stable position for weightbearing. The patient ambulated in these and reported they were comfortable without pressure spots.              BLANK:  Size 10 - Standard Cushioned               BASE:  Blue EVA      POSTINGS:  none          [name], [facility] Physician

## 2018-10-03 DIAGNOSIS — F4312 Post-traumatic stress disorder, chronic: Secondary | ICD-10-CM | POA: Diagnosis not present

## 2018-10-03 DIAGNOSIS — F410 Panic disorder [episodic paroxysmal anxiety] without agoraphobia: Secondary | ICD-10-CM | POA: Diagnosis not present

## 2018-10-03 DIAGNOSIS — F411 Generalized anxiety disorder: Secondary | ICD-10-CM | POA: Diagnosis not present

## 2018-10-03 DIAGNOSIS — F332 Major depressive disorder, recurrent severe without psychotic features: Secondary | ICD-10-CM | POA: Diagnosis not present

## 2018-10-06 DIAGNOSIS — Z79899 Other long term (current) drug therapy: Secondary | ICD-10-CM | POA: Diagnosis not present

## 2018-10-06 DIAGNOSIS — G629 Polyneuropathy, unspecified: Secondary | ICD-10-CM | POA: Diagnosis not present

## 2018-10-06 DIAGNOSIS — Z79891 Long term (current) use of opiate analgesic: Secondary | ICD-10-CM | POA: Diagnosis not present

## 2018-10-06 DIAGNOSIS — M1711 Unilateral primary osteoarthritis, right knee: Secondary | ICD-10-CM | POA: Diagnosis not present

## 2018-10-06 DIAGNOSIS — G894 Chronic pain syndrome: Secondary | ICD-10-CM | POA: Diagnosis not present

## 2018-10-06 DIAGNOSIS — M79671 Pain in right foot: Secondary | ICD-10-CM | POA: Diagnosis not present

## 2018-10-07 ENCOUNTER — Other Ambulatory Visit: Payer: Self-pay | Admitting: Pain Medicine

## 2018-10-07 DIAGNOSIS — M545 Low back pain, unspecified: Secondary | ICD-10-CM

## 2018-10-07 DIAGNOSIS — M4306 Spondylolysis, lumbar region: Secondary | ICD-10-CM | POA: Diagnosis not present

## 2018-10-07 DIAGNOSIS — F332 Major depressive disorder, recurrent severe without psychotic features: Secondary | ICD-10-CM | POA: Diagnosis not present

## 2018-10-09 DIAGNOSIS — M4306 Spondylolysis, lumbar region: Secondary | ICD-10-CM | POA: Diagnosis not present

## 2018-10-09 DIAGNOSIS — F332 Major depressive disorder, recurrent severe without psychotic features: Secondary | ICD-10-CM | POA: Diagnosis not present

## 2018-10-10 DIAGNOSIS — F332 Major depressive disorder, recurrent severe without psychotic features: Secondary | ICD-10-CM | POA: Diagnosis not present

## 2018-10-13 ENCOUNTER — Ambulatory Visit
Admission: RE | Admit: 2018-10-13 | Discharge: 2018-10-13 | Disposition: A | Discharge status: Home / Self Care | Payer: Medicare Other | Source: Ambulatory Visit | Attending: Pain Medicine | Admitting: Pain Medicine

## 2018-10-13 ENCOUNTER — Other Ambulatory Visit: Payer: Self-pay | Admitting: Pain Medicine

## 2018-10-13 DIAGNOSIS — M25561 Pain in right knee: Secondary | ICD-10-CM

## 2018-10-13 DIAGNOSIS — Z471 Aftercare following joint replacement surgery: Secondary | ICD-10-CM | POA: Diagnosis not present

## 2018-10-13 DIAGNOSIS — Z96661 Presence of right artificial ankle joint: Secondary | ICD-10-CM | POA: Diagnosis not present

## 2018-10-13 DIAGNOSIS — F332 Major depressive disorder, recurrent severe without psychotic features: Secondary | ICD-10-CM | POA: Diagnosis not present

## 2018-10-14 DIAGNOSIS — M9903 Segmental and somatic dysfunction of lumbar region: Secondary | ICD-10-CM | POA: Diagnosis not present

## 2018-10-14 DIAGNOSIS — S338XXA Sprain of other parts of lumbar spine and pelvis, initial encounter: Secondary | ICD-10-CM | POA: Diagnosis not present

## 2018-10-14 DIAGNOSIS — M5136 Other intervertebral disc degeneration, lumbar region: Secondary | ICD-10-CM | POA: Diagnosis not present

## 2018-10-14 DIAGNOSIS — M47812 Spondylosis without myelopathy or radiculopathy, cervical region: Secondary | ICD-10-CM | POA: Diagnosis not present

## 2018-10-14 DIAGNOSIS — M9902 Segmental and somatic dysfunction of thoracic region: Secondary | ICD-10-CM | POA: Diagnosis not present

## 2018-10-14 DIAGNOSIS — M9901 Segmental and somatic dysfunction of cervical region: Secondary | ICD-10-CM | POA: Diagnosis not present

## 2018-10-14 DIAGNOSIS — S29012A Strain of muscle and tendon of back wall of thorax, initial encounter: Secondary | ICD-10-CM | POA: Diagnosis not present

## 2018-10-14 DIAGNOSIS — M9904 Segmental and somatic dysfunction of sacral region: Secondary | ICD-10-CM | POA: Diagnosis not present

## 2018-10-15 DIAGNOSIS — F332 Major depressive disorder, recurrent severe without psychotic features: Secondary | ICD-10-CM | POA: Diagnosis not present

## 2018-10-16 ENCOUNTER — Ambulatory Visit
Admission: RE | Admit: 2018-10-16 | Discharge: 2018-10-16 | Disposition: A | Discharge status: Home / Self Care | Payer: Medicare Other | Source: Ambulatory Visit | Attending: Pain Medicine | Admitting: Pain Medicine

## 2018-10-16 DIAGNOSIS — F332 Major depressive disorder, recurrent severe without psychotic features: Secondary | ICD-10-CM | POA: Diagnosis not present

## 2018-10-16 DIAGNOSIS — M5117 Intervertebral disc disorders with radiculopathy, lumbosacral region: Secondary | ICD-10-CM | POA: Diagnosis not present

## 2018-10-16 DIAGNOSIS — M4727 Other spondylosis with radiculopathy, lumbosacral region: Secondary | ICD-10-CM | POA: Diagnosis not present

## 2018-10-16 DIAGNOSIS — M545 Low back pain, unspecified: Secondary | ICD-10-CM

## 2018-10-17 DIAGNOSIS — F332 Major depressive disorder, recurrent severe without psychotic features: Secondary | ICD-10-CM | POA: Diagnosis not present

## 2018-10-21 ENCOUNTER — Other Ambulatory Visit: Payer: Self-pay

## 2018-10-21 DIAGNOSIS — F332 Major depressive disorder, recurrent severe without psychotic features: Secondary | ICD-10-CM | POA: Diagnosis not present

## 2018-10-22 DIAGNOSIS — F332 Major depressive disorder, recurrent severe without psychotic features: Secondary | ICD-10-CM | POA: Diagnosis not present

## 2018-10-22 MED ORDER — PREGABALIN 50 MG PO CAPS: ORAL_CAPSULE | ORAL | 5 refills | Status: DC

## 2018-10-23 DIAGNOSIS — F332 Major depressive disorder, recurrent severe without psychotic features: Secondary | ICD-10-CM | POA: Diagnosis not present

## 2018-10-27 DIAGNOSIS — F332 Major depressive disorder, recurrent severe without psychotic features: Secondary | ICD-10-CM | POA: Diagnosis not present

## 2018-10-28 ENCOUNTER — Ambulatory Visit: Payer: Medicare Other | Admitting: Sports Medicine

## 2018-10-28 DIAGNOSIS — M9903 Segmental and somatic dysfunction of lumbar region: Secondary | ICD-10-CM | POA: Diagnosis not present

## 2018-10-28 DIAGNOSIS — M47812 Spondylosis without myelopathy or radiculopathy, cervical region: Secondary | ICD-10-CM | POA: Diagnosis not present

## 2018-10-28 DIAGNOSIS — M9904 Segmental and somatic dysfunction of sacral region: Secondary | ICD-10-CM | POA: Diagnosis not present

## 2018-10-28 DIAGNOSIS — S29012A Strain of muscle and tendon of back wall of thorax, initial encounter: Secondary | ICD-10-CM | POA: Diagnosis not present

## 2018-10-28 DIAGNOSIS — M9901 Segmental and somatic dysfunction of cervical region: Secondary | ICD-10-CM | POA: Diagnosis not present

## 2018-10-28 DIAGNOSIS — F332 Major depressive disorder, recurrent severe without psychotic features: Secondary | ICD-10-CM | POA: Diagnosis not present

## 2018-10-28 DIAGNOSIS — M5136 Other intervertebral disc degeneration, lumbar region: Secondary | ICD-10-CM | POA: Diagnosis not present

## 2018-10-28 DIAGNOSIS — M9902 Segmental and somatic dysfunction of thoracic region: Secondary | ICD-10-CM | POA: Diagnosis not present

## 2018-10-28 DIAGNOSIS — S338XXA Sprain of other parts of lumbar spine and pelvis, initial encounter: Secondary | ICD-10-CM | POA: Diagnosis not present

## 2018-10-29 ENCOUNTER — Ambulatory Visit
Admission: RE | Admit: 2018-10-29 | Discharge: 2018-10-29 | Disposition: A | Discharge status: Home / Self Care | Payer: Medicare Other | Source: Ambulatory Visit | Attending: Family Medicine | Admitting: Family Medicine

## 2018-10-29 DIAGNOSIS — M47817 Spondylosis without myelopathy or radiculopathy, lumbosacral region: Secondary | ICD-10-CM | POA: Diagnosis not present

## 2018-10-29 DIAGNOSIS — Z79899 Other long term (current) drug therapy: Secondary | ICD-10-CM | POA: Diagnosis not present

## 2018-10-29 DIAGNOSIS — G894 Chronic pain syndrome: Secondary | ICD-10-CM | POA: Diagnosis not present

## 2018-10-29 DIAGNOSIS — Z1231 Encounter for screening mammogram for malignant neoplasm of breast: Secondary | ICD-10-CM

## 2018-10-29 DIAGNOSIS — Z79891 Long term (current) use of opiate analgesic: Secondary | ICD-10-CM | POA: Diagnosis not present

## 2018-10-29 DIAGNOSIS — M1711 Unilateral primary osteoarthritis, right knee: Secondary | ICD-10-CM | POA: Diagnosis not present

## 2018-10-29 DIAGNOSIS — F332 Major depressive disorder, recurrent severe without psychotic features: Secondary | ICD-10-CM | POA: Diagnosis not present

## 2018-10-30 DIAGNOSIS — F332 Major depressive disorder, recurrent severe without psychotic features: Secondary | ICD-10-CM | POA: Diagnosis not present

## 2018-10-31 DIAGNOSIS — F332 Major depressive disorder, recurrent severe without psychotic features: Secondary | ICD-10-CM | POA: Diagnosis not present

## 2018-11-03 ENCOUNTER — Other Ambulatory Visit: Payer: Self-pay | Admitting: *Deleted

## 2018-11-03 ENCOUNTER — Telehealth: Payer: Self-pay | Admitting: Neurology

## 2018-11-03 NOTE — Telephone Encounter (Signed)
Will you please send?

## 2018-11-03 NOTE — Telephone Encounter (Signed)
Patient had refill sent on 12/30 with 5 refills for Lyrica 75mg  twice daily.  Did she forget her medication?  How long is she out of town?

## 2018-11-03 NOTE — Telephone Encounter (Signed)
Patient is calling in about a refill for the Lyrica 75mg  sent to the [facility] in [place] on [address]. Please send. Thanks!

## 2018-11-04 MED ORDER — PREGABALIN 75 MG PO CAPS: 75.0000 mg | ORAL_CAPSULE | Freq: Two times a day (BID) | ORAL | 0 refills | Status: DC

## 2018-11-04 NOTE — Telephone Encounter (Signed)
One week supply of Lyrica 75mg  BID has been sent.

## 2018-11-04 NOTE — Telephone Encounter (Signed)
Patient is in [place] this week.  She could not get her Rx filled before she left [place] since it was not time yet.  She would like her refill sent to [facility] in [place] on [address].

## 2018-11-04 NOTE — Addendum Note (Signed)
Addended by: [name] on: 11/04/2018 11:07 AM   Modules accepted: Orders

## 2018-11-04 NOTE — Telephone Encounter (Signed)
Left message for patient to call me and let me know why she needs refills sent to [facility].

## 2018-11-10 DIAGNOSIS — F4312 Post-traumatic stress disorder, chronic: Secondary | ICD-10-CM | POA: Diagnosis not present

## 2018-11-10 DIAGNOSIS — F411 Generalized anxiety disorder: Secondary | ICD-10-CM | POA: Diagnosis not present

## 2018-11-10 DIAGNOSIS — F410 Panic disorder [episodic paroxysmal anxiety] without agoraphobia: Secondary | ICD-10-CM | POA: Diagnosis not present

## 2018-11-10 DIAGNOSIS — F332 Major depressive disorder, recurrent severe without psychotic features: Secondary | ICD-10-CM | POA: Diagnosis not present

## 2018-11-11 ENCOUNTER — Encounter: Payer: Self-pay | Admitting: Sports Medicine

## 2018-11-11 ENCOUNTER — Ambulatory Visit (INDEPENDENT_AMBULATORY_CARE_PROVIDER_SITE_OTHER): Payer: Medicare Other | Admitting: Sports Medicine

## 2018-11-11 ENCOUNTER — Ambulatory Visit: Payer: Self-pay

## 2018-11-11 VITALS — BP 130/88 | HR 71 | Ht 67.0 in | Wt 253.6 lb

## 2018-11-11 DIAGNOSIS — M479 Spondylosis, unspecified: Secondary | ICD-10-CM

## 2018-11-11 DIAGNOSIS — M7741 Metatarsalgia, right foot: Secondary | ICD-10-CM | POA: Diagnosis not present

## 2018-11-11 DIAGNOSIS — M25571 Pain in right ankle and joints of right foot: Secondary | ICD-10-CM

## 2018-11-11 DIAGNOSIS — F332 Major depressive disorder, recurrent severe without psychotic features: Secondary | ICD-10-CM | POA: Diagnosis not present

## 2018-11-11 DIAGNOSIS — R269 Unspecified abnormalities of gait and mobility: Secondary | ICD-10-CM | POA: Diagnosis not present

## 2018-11-11 DIAGNOSIS — M5410 Radiculopathy, site unspecified: Secondary | ICD-10-CM

## 2018-11-11 NOTE — Patient Instructions (Addendum)
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
## 2018-11-11 NOTE — Procedures (Signed)
PROCEDURE NOTE:  Ultrasound Guided: Injection: Right ankle Images were obtained and interpreted by myself, Teresa Coombs, DO  Images have been saved and stored to PACS system. Images obtained on: GE S7 Ultrasound machine    ULTRASOUND FINDINGS:  Small supraphysiologic effusion.  Anterior lateral soft tissue swelling.  Mild peroneal tendon irritation but no significant tenosynovitis.    DESCRIPTION OF PROCEDURE:  The patient's clinical condition is marked by substantial pain and/or significant functional disability. Other conservative therapy has not provided relief, is contraindicated, or not appropriate. There is a reasonable likelihood that injection will significantly improve the patient's pain and/or functional impairment.   After discussing the risks, benefits and expected outcomes of the injection and all questions were reviewed and answered, the patient wished to undergo the above named procedure.  Verbal consent was obtained.  The ultrasound was used to identify the target structure and adjacent neurovascular structures. The skin was then prepped in sterile fashion and the target structure was injected under direct visualization using sterile technique as below:  Single injection performed as below: PREP: Alcohol and Ethel Chloride APPROACH:superiolateral, single injection, 25g 1.5 in. INJECTATE: 2 cc 0.5% Marcaine and 2 cc 40mg /mL DepoMedrol ASPIRATE: None DRESSING: Band-Aid  Post procedural instructions including recommending icing and warning signs for infection were reviewed.    This procedure was well tolerated and there were no complications.   IMPRESSION: Succesful Ultrasound Guided: Injection

## 2018-11-12 DIAGNOSIS — F332 Major depressive disorder, recurrent severe without psychotic features: Secondary | ICD-10-CM | POA: Diagnosis not present

## 2018-11-13 DIAGNOSIS — F332 Major depressive disorder, recurrent severe without psychotic features: Secondary | ICD-10-CM | POA: Diagnosis not present

## 2018-11-13 DIAGNOSIS — F4312 Post-traumatic stress disorder, chronic: Secondary | ICD-10-CM | POA: Diagnosis not present

## 2018-11-13 DIAGNOSIS — F411 Generalized anxiety disorder: Secondary | ICD-10-CM | POA: Diagnosis not present

## 2018-11-13 DIAGNOSIS — F410 Panic disorder [episodic paroxysmal anxiety] without agoraphobia: Secondary | ICD-10-CM | POA: Diagnosis not present

## 2018-11-14 DIAGNOSIS — F332 Major depressive disorder, recurrent severe without psychotic features: Secondary | ICD-10-CM | POA: Diagnosis not present

## 2018-11-17 DIAGNOSIS — S29012A Strain of muscle and tendon of back wall of thorax, initial encounter: Secondary | ICD-10-CM | POA: Diagnosis not present

## 2018-11-17 DIAGNOSIS — M47812 Spondylosis without myelopathy or radiculopathy, cervical region: Secondary | ICD-10-CM | POA: Diagnosis not present

## 2018-11-17 DIAGNOSIS — M9901 Segmental and somatic dysfunction of cervical region: Secondary | ICD-10-CM | POA: Diagnosis not present

## 2018-11-17 DIAGNOSIS — F332 Major depressive disorder, recurrent severe without psychotic features: Secondary | ICD-10-CM | POA: Diagnosis not present

## 2018-11-17 DIAGNOSIS — M9902 Segmental and somatic dysfunction of thoracic region: Secondary | ICD-10-CM | POA: Diagnosis not present

## 2018-11-17 DIAGNOSIS — S338XXA Sprain of other parts of lumbar spine and pelvis, initial encounter: Secondary | ICD-10-CM | POA: Diagnosis not present

## 2018-11-17 DIAGNOSIS — M9903 Segmental and somatic dysfunction of lumbar region: Secondary | ICD-10-CM | POA: Diagnosis not present

## 2018-11-17 DIAGNOSIS — M5136 Other intervertebral disc degeneration, lumbar region: Secondary | ICD-10-CM | POA: Diagnosis not present

## 2018-11-17 DIAGNOSIS — M9904 Segmental and somatic dysfunction of sacral region: Secondary | ICD-10-CM | POA: Diagnosis not present

## 2018-11-19 DIAGNOSIS — F411 Generalized anxiety disorder: Secondary | ICD-10-CM | POA: Diagnosis not present

## 2018-11-19 DIAGNOSIS — F410 Panic disorder [episodic paroxysmal anxiety] without agoraphobia: Secondary | ICD-10-CM | POA: Diagnosis not present

## 2018-11-19 DIAGNOSIS — F332 Major depressive disorder, recurrent severe without psychotic features: Secondary | ICD-10-CM | POA: Diagnosis not present

## 2018-11-19 DIAGNOSIS — F4312 Post-traumatic stress disorder, chronic: Secondary | ICD-10-CM | POA: Diagnosis not present

## 2018-11-24 DIAGNOSIS — F410 Panic disorder [episodic paroxysmal anxiety] without agoraphobia: Secondary | ICD-10-CM | POA: Diagnosis not present

## 2018-11-24 DIAGNOSIS — F4312 Post-traumatic stress disorder, chronic: Secondary | ICD-10-CM | POA: Diagnosis not present

## 2018-11-24 DIAGNOSIS — F411 Generalized anxiety disorder: Secondary | ICD-10-CM | POA: Diagnosis not present

## 2018-11-24 DIAGNOSIS — F332 Major depressive disorder, recurrent severe without psychotic features: Secondary | ICD-10-CM | POA: Diagnosis not present

## 2018-11-25 DIAGNOSIS — S338XXA Sprain of other parts of lumbar spine and pelvis, initial encounter: Secondary | ICD-10-CM | POA: Diagnosis not present

## 2018-11-25 DIAGNOSIS — M9901 Segmental and somatic dysfunction of cervical region: Secondary | ICD-10-CM | POA: Diagnosis not present

## 2018-11-25 DIAGNOSIS — M47812 Spondylosis without myelopathy or radiculopathy, cervical region: Secondary | ICD-10-CM | POA: Diagnosis not present

## 2018-11-25 DIAGNOSIS — M9902 Segmental and somatic dysfunction of thoracic region: Secondary | ICD-10-CM | POA: Diagnosis not present

## 2018-11-25 DIAGNOSIS — M9904 Segmental and somatic dysfunction of sacral region: Secondary | ICD-10-CM | POA: Diagnosis not present

## 2018-11-25 DIAGNOSIS — M5136 Other intervertebral disc degeneration, lumbar region: Secondary | ICD-10-CM | POA: Diagnosis not present

## 2018-11-25 DIAGNOSIS — F332 Major depressive disorder, recurrent severe without psychotic features: Secondary | ICD-10-CM | POA: Diagnosis not present

## 2018-11-25 DIAGNOSIS — M9903 Segmental and somatic dysfunction of lumbar region: Secondary | ICD-10-CM | POA: Diagnosis not present

## 2018-11-25 DIAGNOSIS — S29012A Strain of muscle and tendon of back wall of thorax, initial encounter: Secondary | ICD-10-CM | POA: Diagnosis not present

## 2018-11-26 DIAGNOSIS — M1711 Unilateral primary osteoarthritis, right knee: Secondary | ICD-10-CM | POA: Diagnosis not present

## 2018-11-26 DIAGNOSIS — M5136 Other intervertebral disc degeneration, lumbar region: Secondary | ICD-10-CM | POA: Diagnosis not present

## 2018-11-26 DIAGNOSIS — M47817 Spondylosis without myelopathy or radiculopathy, lumbosacral region: Secondary | ICD-10-CM | POA: Diagnosis not present

## 2018-11-26 DIAGNOSIS — F411 Generalized anxiety disorder: Secondary | ICD-10-CM | POA: Diagnosis not present

## 2018-11-26 DIAGNOSIS — F4312 Post-traumatic stress disorder, chronic: Secondary | ICD-10-CM | POA: Diagnosis not present

## 2018-11-26 DIAGNOSIS — F410 Panic disorder [episodic paroxysmal anxiety] without agoraphobia: Secondary | ICD-10-CM | POA: Diagnosis not present

## 2018-11-26 DIAGNOSIS — G894 Chronic pain syndrome: Secondary | ICD-10-CM | POA: Diagnosis not present

## 2018-11-26 DIAGNOSIS — F332 Major depressive disorder, recurrent severe without psychotic features: Secondary | ICD-10-CM | POA: Diagnosis not present

## 2018-11-27 DIAGNOSIS — Z96651 Presence of right artificial knee joint: Secondary | ICD-10-CM | POA: Diagnosis not present

## 2018-11-27 DIAGNOSIS — F332 Major depressive disorder, recurrent severe without psychotic features: Secondary | ICD-10-CM | POA: Diagnosis not present

## 2018-11-28 DIAGNOSIS — F332 Major depressive disorder, recurrent severe without psychotic features: Secondary | ICD-10-CM | POA: Diagnosis not present

## 2018-12-01 DIAGNOSIS — F332 Major depressive disorder, recurrent severe without psychotic features: Secondary | ICD-10-CM | POA: Diagnosis not present

## 2018-12-03 DIAGNOSIS — F332 Major depressive disorder, recurrent severe without psychotic features: Secondary | ICD-10-CM | POA: Diagnosis not present

## 2018-12-03 DIAGNOSIS — M9901 Segmental and somatic dysfunction of cervical region: Secondary | ICD-10-CM | POA: Diagnosis not present

## 2018-12-03 DIAGNOSIS — S29012A Strain of muscle and tendon of back wall of thorax, initial encounter: Secondary | ICD-10-CM | POA: Diagnosis not present

## 2018-12-03 DIAGNOSIS — M47812 Spondylosis without myelopathy or radiculopathy, cervical region: Secondary | ICD-10-CM | POA: Diagnosis not present

## 2018-12-03 DIAGNOSIS — S338XXA Sprain of other parts of lumbar spine and pelvis, initial encounter: Secondary | ICD-10-CM | POA: Diagnosis not present

## 2018-12-03 DIAGNOSIS — M5136 Other intervertebral disc degeneration, lumbar region: Secondary | ICD-10-CM | POA: Diagnosis not present

## 2018-12-03 DIAGNOSIS — M9904 Segmental and somatic dysfunction of sacral region: Secondary | ICD-10-CM | POA: Diagnosis not present

## 2018-12-03 DIAGNOSIS — M9902 Segmental and somatic dysfunction of thoracic region: Secondary | ICD-10-CM | POA: Diagnosis not present

## 2018-12-03 DIAGNOSIS — M9903 Segmental and somatic dysfunction of lumbar region: Secondary | ICD-10-CM | POA: Diagnosis not present

## 2018-12-04 DIAGNOSIS — F332 Major depressive disorder, recurrent severe without psychotic features: Secondary | ICD-10-CM | POA: Diagnosis not present

## 2018-12-05 DIAGNOSIS — F332 Major depressive disorder, recurrent severe without psychotic features: Secondary | ICD-10-CM | POA: Diagnosis not present

## 2018-12-08 DIAGNOSIS — M47817 Spondylosis without myelopathy or radiculopathy, lumbosacral region: Secondary | ICD-10-CM | POA: Diagnosis not present

## 2018-12-08 DIAGNOSIS — M5136 Other intervertebral disc degeneration, lumbar region: Secondary | ICD-10-CM | POA: Diagnosis not present

## 2018-12-09 DIAGNOSIS — F332 Major depressive disorder, recurrent severe without psychotic features: Secondary | ICD-10-CM | POA: Diagnosis not present

## 2018-12-10 DIAGNOSIS — F332 Major depressive disorder, recurrent severe without psychotic features: Secondary | ICD-10-CM | POA: Diagnosis not present

## 2018-12-11 DIAGNOSIS — F332 Major depressive disorder, recurrent severe without psychotic features: Secondary | ICD-10-CM | POA: Diagnosis not present

## 2018-12-12 DIAGNOSIS — F332 Major depressive disorder, recurrent severe without psychotic features: Secondary | ICD-10-CM | POA: Diagnosis not present

## 2018-12-17 DIAGNOSIS — M5136 Other intervertebral disc degeneration, lumbar region: Secondary | ICD-10-CM | POA: Diagnosis not present

## 2018-12-17 DIAGNOSIS — M9902 Segmental and somatic dysfunction of thoracic region: Secondary | ICD-10-CM | POA: Diagnosis not present

## 2018-12-17 DIAGNOSIS — S338XXA Sprain of other parts of lumbar spine and pelvis, initial encounter: Secondary | ICD-10-CM | POA: Diagnosis not present

## 2018-12-17 DIAGNOSIS — M9904 Segmental and somatic dysfunction of sacral region: Secondary | ICD-10-CM | POA: Diagnosis not present

## 2018-12-17 DIAGNOSIS — S29012A Strain of muscle and tendon of back wall of thorax, initial encounter: Secondary | ICD-10-CM | POA: Diagnosis not present

## 2018-12-17 DIAGNOSIS — M47812 Spondylosis without myelopathy or radiculopathy, cervical region: Secondary | ICD-10-CM | POA: Diagnosis not present

## 2018-12-17 DIAGNOSIS — M9901 Segmental and somatic dysfunction of cervical region: Secondary | ICD-10-CM | POA: Diagnosis not present

## 2018-12-17 DIAGNOSIS — M9903 Segmental and somatic dysfunction of lumbar region: Secondary | ICD-10-CM | POA: Diagnosis not present

## 2019-01-06 ENCOUNTER — Ambulatory Visit: Payer: Medicare Other | Admitting: Sports Medicine

## 2019-02-03 DIAGNOSIS — F324 Major depressive disorder, single episode, in partial remission: Secondary | ICD-10-CM | POA: Diagnosis not present

## 2019-02-03 DIAGNOSIS — E7800 Pure hypercholesterolemia, unspecified: Secondary | ICD-10-CM | POA: Diagnosis not present

## 2019-02-03 DIAGNOSIS — Z0000 Encounter for general adult medical examination without abnormal findings: Secondary | ICD-10-CM | POA: Diagnosis not present

## 2019-02-03 DIAGNOSIS — E039 Hypothyroidism, unspecified: Secondary | ICD-10-CM | POA: Diagnosis not present

## 2019-02-03 DIAGNOSIS — I10 Essential (primary) hypertension: Secondary | ICD-10-CM | POA: Diagnosis not present

## 2019-02-03 DIAGNOSIS — K219 Gastro-esophageal reflux disease without esophagitis: Secondary | ICD-10-CM | POA: Diagnosis not present

## 2019-02-03 MED ORDER — PREGABALIN 100 MG PO CAPS: 100.0000 mg | ORAL_CAPSULE | Freq: Two times a day (BID) | ORAL | 5 refills | Status: DC

## 2019-02-04 DIAGNOSIS — M5136 Other intervertebral disc degeneration, lumbar region: Secondary | ICD-10-CM | POA: Diagnosis not present

## 2019-02-04 DIAGNOSIS — M47817 Spondylosis without myelopathy or radiculopathy, lumbosacral region: Secondary | ICD-10-CM | POA: Diagnosis not present

## 2019-02-09 ENCOUNTER — Other Ambulatory Visit: Payer: Self-pay | Admitting: Family Medicine

## 2019-02-09 ENCOUNTER — Other Ambulatory Visit (HOSPITAL_COMMUNITY)
Admission: RE | Admit: 2019-02-09 | Discharge: 2019-02-09 | Disposition: A | Discharge status: Home / Self Care | Payer: Medicare Other | Source: Ambulatory Visit | Attending: Family Medicine | Admitting: Family Medicine

## 2019-02-09 DIAGNOSIS — Z1151 Encounter for screening for human papillomavirus (HPV): Secondary | ICD-10-CM | POA: Insufficient documentation

## 2019-02-09 DIAGNOSIS — Z23 Encounter for immunization: Secondary | ICD-10-CM | POA: Diagnosis not present

## 2019-02-09 DIAGNOSIS — Z124 Encounter for screening for malignant neoplasm of cervix: Secondary | ICD-10-CM | POA: Insufficient documentation

## 2019-02-09 DIAGNOSIS — E7800 Pure hypercholesterolemia, unspecified: Secondary | ICD-10-CM | POA: Diagnosis not present

## 2019-02-09 DIAGNOSIS — Z01419 Encounter for gynecological examination (general) (routine) without abnormal findings: Secondary | ICD-10-CM | POA: Diagnosis not present

## 2019-02-09 DIAGNOSIS — M7701 Medial epicondylitis, right elbow: Secondary | ICD-10-CM | POA: Diagnosis not present

## 2019-02-09 DIAGNOSIS — R399 Unspecified symptoms and signs involving the genitourinary system: Secondary | ICD-10-CM | POA: Diagnosis not present

## 2019-02-11 LAB — CYTOLOGY - PAP
Diagnosis: NEGATIVE
HPV: NOT DETECTED

## 2019-02-26 DIAGNOSIS — S29012A Strain of muscle and tendon of back wall of thorax, initial encounter: Secondary | ICD-10-CM | POA: Diagnosis not present

## 2019-02-26 DIAGNOSIS — M9902 Segmental and somatic dysfunction of thoracic region: Secondary | ICD-10-CM | POA: Diagnosis not present

## 2019-02-26 DIAGNOSIS — S338XXA Sprain of other parts of lumbar spine and pelvis, initial encounter: Secondary | ICD-10-CM | POA: Diagnosis not present

## 2019-02-26 DIAGNOSIS — M9904 Segmental and somatic dysfunction of sacral region: Secondary | ICD-10-CM | POA: Diagnosis not present

## 2019-02-26 DIAGNOSIS — M47812 Spondylosis without myelopathy or radiculopathy, cervical region: Secondary | ICD-10-CM | POA: Diagnosis not present

## 2019-02-26 DIAGNOSIS — M9901 Segmental and somatic dysfunction of cervical region: Secondary | ICD-10-CM | POA: Diagnosis not present

## 2019-02-26 DIAGNOSIS — M9903 Segmental and somatic dysfunction of lumbar region: Secondary | ICD-10-CM | POA: Diagnosis not present

## 2019-02-26 DIAGNOSIS — M5136 Other intervertebral disc degeneration, lumbar region: Secondary | ICD-10-CM | POA: Diagnosis not present

## 2019-03-25 DIAGNOSIS — G894 Chronic pain syndrome: Secondary | ICD-10-CM | POA: Diagnosis not present

## 2019-03-25 DIAGNOSIS — G629 Polyneuropathy, unspecified: Secondary | ICD-10-CM | POA: Diagnosis not present

## 2019-03-25 DIAGNOSIS — Z79899 Other long term (current) drug therapy: Secondary | ICD-10-CM | POA: Diagnosis not present

## 2019-03-25 DIAGNOSIS — Z79891 Long term (current) use of opiate analgesic: Secondary | ICD-10-CM | POA: Diagnosis not present

## 2019-03-25 DIAGNOSIS — M1711 Unilateral primary osteoarthritis, right knee: Secondary | ICD-10-CM | POA: Diagnosis not present

## 2019-03-25 DIAGNOSIS — M5136 Other intervertebral disc degeneration, lumbar region: Secondary | ICD-10-CM | POA: Diagnosis not present

## 2019-04-07 DIAGNOSIS — Z96651 Presence of right artificial knee joint: Secondary | ICD-10-CM | POA: Diagnosis not present

## 2019-04-09 DIAGNOSIS — F4312 Post-traumatic stress disorder, chronic: Secondary | ICD-10-CM | POA: Diagnosis not present

## 2019-04-09 DIAGNOSIS — F332 Major depressive disorder, recurrent severe without psychotic features: Secondary | ICD-10-CM | POA: Diagnosis not present

## 2019-04-09 DIAGNOSIS — F411 Generalized anxiety disorder: Secondary | ICD-10-CM | POA: Diagnosis not present

## 2019-04-09 DIAGNOSIS — F410 Panic disorder [episodic paroxysmal anxiety] without agoraphobia: Secondary | ICD-10-CM | POA: Diagnosis not present

## 2019-04-17 DIAGNOSIS — I10 Essential (primary) hypertension: Secondary | ICD-10-CM | POA: Diagnosis not present

## 2019-04-22 DIAGNOSIS — G894 Chronic pain syndrome: Secondary | ICD-10-CM | POA: Diagnosis not present

## 2019-04-22 DIAGNOSIS — M47817 Spondylosis without myelopathy or radiculopathy, lumbosacral region: Secondary | ICD-10-CM | POA: Diagnosis not present

## 2019-04-22 DIAGNOSIS — M5136 Other intervertebral disc degeneration, lumbar region: Secondary | ICD-10-CM | POA: Diagnosis not present

## 2019-04-22 DIAGNOSIS — M1711 Unilateral primary osteoarthritis, right knee: Secondary | ICD-10-CM | POA: Diagnosis not present

## 2019-04-23 DIAGNOSIS — G8929 Other chronic pain: Secondary | ICD-10-CM | POA: Diagnosis not present

## 2019-04-23 DIAGNOSIS — M25561 Pain in right knee: Secondary | ICD-10-CM | POA: Diagnosis not present

## 2019-04-23 DIAGNOSIS — Z96651 Presence of right artificial knee joint: Secondary | ICD-10-CM | POA: Diagnosis not present

## 2019-04-30 DIAGNOSIS — R198 Other specified symptoms and signs involving the digestive system and abdomen: Secondary | ICD-10-CM | POA: Diagnosis not present

## 2019-04-30 DIAGNOSIS — R110 Nausea: Secondary | ICD-10-CM | POA: Diagnosis not present

## 2019-05-04 DIAGNOSIS — M9902 Segmental and somatic dysfunction of thoracic region: Secondary | ICD-10-CM | POA: Diagnosis not present

## 2019-05-04 DIAGNOSIS — M5136 Other intervertebral disc degeneration, lumbar region: Secondary | ICD-10-CM | POA: Diagnosis not present

## 2019-05-04 DIAGNOSIS — M9901 Segmental and somatic dysfunction of cervical region: Secondary | ICD-10-CM | POA: Diagnosis not present

## 2019-05-04 DIAGNOSIS — M9903 Segmental and somatic dysfunction of lumbar region: Secondary | ICD-10-CM | POA: Diagnosis not present

## 2019-05-04 DIAGNOSIS — M47812 Spondylosis without myelopathy or radiculopathy, cervical region: Secondary | ICD-10-CM | POA: Diagnosis not present

## 2019-05-04 DIAGNOSIS — M9904 Segmental and somatic dysfunction of sacral region: Secondary | ICD-10-CM | POA: Diagnosis not present

## 2019-05-04 DIAGNOSIS — S338XXA Sprain of other parts of lumbar spine and pelvis, initial encounter: Secondary | ICD-10-CM | POA: Diagnosis not present

## 2019-05-04 DIAGNOSIS — S29012A Strain of muscle and tendon of back wall of thorax, initial encounter: Secondary | ICD-10-CM | POA: Diagnosis not present

## 2019-05-06 DIAGNOSIS — M25511 Pain in right shoulder: Secondary | ICD-10-CM | POA: Diagnosis not present

## 2019-05-06 DIAGNOSIS — M5136 Other intervertebral disc degeneration, lumbar region: Secondary | ICD-10-CM | POA: Diagnosis not present

## 2019-05-07 DIAGNOSIS — M47817 Spondylosis without myelopathy or radiculopathy, lumbosacral region: Secondary | ICD-10-CM | POA: Diagnosis not present

## 2019-05-07 DIAGNOSIS — M5136 Other intervertebral disc degeneration, lumbar region: Secondary | ICD-10-CM | POA: Diagnosis not present

## 2019-05-11 DIAGNOSIS — R198 Other specified symptoms and signs involving the digestive system and abdomen: Secondary | ICD-10-CM | POA: Diagnosis not present

## 2019-05-11 DIAGNOSIS — K5900 Constipation, unspecified: Secondary | ICD-10-CM | POA: Diagnosis not present

## 2019-05-11 DIAGNOSIS — R197 Diarrhea, unspecified: Secondary | ICD-10-CM | POA: Diagnosis not present

## 2019-05-12 DIAGNOSIS — F410 Panic disorder [episodic paroxysmal anxiety] without agoraphobia: Secondary | ICD-10-CM | POA: Diagnosis not present

## 2019-05-12 DIAGNOSIS — F332 Major depressive disorder, recurrent severe without psychotic features: Secondary | ICD-10-CM | POA: Diagnosis not present

## 2019-05-12 DIAGNOSIS — F411 Generalized anxiety disorder: Secondary | ICD-10-CM | POA: Diagnosis not present

## 2019-05-12 DIAGNOSIS — F4312 Post-traumatic stress disorder, chronic: Secondary | ICD-10-CM | POA: Diagnosis not present

## 2019-05-26 DIAGNOSIS — M47812 Spondylosis without myelopathy or radiculopathy, cervical region: Secondary | ICD-10-CM | POA: Diagnosis not present

## 2019-05-26 DIAGNOSIS — M62838 Other muscle spasm: Secondary | ICD-10-CM | POA: Diagnosis not present

## 2019-05-28 DIAGNOSIS — Z96651 Presence of right artificial knee joint: Secondary | ICD-10-CM | POA: Diagnosis not present

## 2019-05-28 DIAGNOSIS — M25561 Pain in right knee: Secondary | ICD-10-CM | POA: Diagnosis not present

## 2019-05-28 DIAGNOSIS — G8929 Other chronic pain: Secondary | ICD-10-CM | POA: Diagnosis not present

## 2019-06-01 DIAGNOSIS — M62838 Other muscle spasm: Secondary | ICD-10-CM | POA: Diagnosis not present

## 2019-06-01 DIAGNOSIS — M47812 Spondylosis without myelopathy or radiculopathy, cervical region: Secondary | ICD-10-CM | POA: Diagnosis not present

## 2019-06-02 DIAGNOSIS — M5136 Other intervertebral disc degeneration, lumbar region: Secondary | ICD-10-CM | POA: Diagnosis not present

## 2019-06-02 DIAGNOSIS — G629 Polyneuropathy, unspecified: Secondary | ICD-10-CM | POA: Diagnosis not present

## 2019-06-02 DIAGNOSIS — G8929 Other chronic pain: Secondary | ICD-10-CM | POA: Diagnosis not present

## 2019-06-02 DIAGNOSIS — M1711 Unilateral primary osteoarthritis, right knee: Secondary | ICD-10-CM | POA: Diagnosis not present

## 2019-06-03 DIAGNOSIS — M5136 Other intervertebral disc degeneration, lumbar region: Secondary | ICD-10-CM | POA: Diagnosis not present

## 2019-06-03 DIAGNOSIS — M25511 Pain in right shoulder: Secondary | ICD-10-CM | POA: Diagnosis not present

## 2019-06-04 DIAGNOSIS — M5136 Other intervertebral disc degeneration, lumbar region: Secondary | ICD-10-CM | POA: Diagnosis not present

## 2019-06-04 DIAGNOSIS — M9904 Segmental and somatic dysfunction of sacral region: Secondary | ICD-10-CM | POA: Diagnosis not present

## 2019-06-04 DIAGNOSIS — M9902 Segmental and somatic dysfunction of thoracic region: Secondary | ICD-10-CM | POA: Diagnosis not present

## 2019-06-04 DIAGNOSIS — M9903 Segmental and somatic dysfunction of lumbar region: Secondary | ICD-10-CM | POA: Diagnosis not present

## 2019-06-04 DIAGNOSIS — S29012A Strain of muscle and tendon of back wall of thorax, initial encounter: Secondary | ICD-10-CM | POA: Diagnosis not present

## 2019-06-04 DIAGNOSIS — S338XXA Sprain of other parts of lumbar spine and pelvis, initial encounter: Secondary | ICD-10-CM | POA: Diagnosis not present

## 2019-06-04 DIAGNOSIS — M9901 Segmental and somatic dysfunction of cervical region: Secondary | ICD-10-CM | POA: Diagnosis not present

## 2019-06-04 DIAGNOSIS — R197 Diarrhea, unspecified: Secondary | ICD-10-CM | POA: Diagnosis not present

## 2019-06-04 DIAGNOSIS — M47812 Spondylosis without myelopathy or radiculopathy, cervical region: Secondary | ICD-10-CM | POA: Diagnosis not present

## 2019-06-05 DIAGNOSIS — M47812 Spondylosis without myelopathy or radiculopathy, cervical region: Secondary | ICD-10-CM | POA: Diagnosis not present

## 2019-06-05 DIAGNOSIS — M62838 Other muscle spasm: Secondary | ICD-10-CM | POA: Diagnosis not present

## 2019-06-09 DIAGNOSIS — M62838 Other muscle spasm: Secondary | ICD-10-CM | POA: Diagnosis not present

## 2019-06-09 DIAGNOSIS — M47812 Spondylosis without myelopathy or radiculopathy, cervical region: Secondary | ICD-10-CM | POA: Diagnosis not present

## 2019-06-10 DIAGNOSIS — R1013 Epigastric pain: Secondary | ICD-10-CM | POA: Diagnosis not present

## 2019-06-10 DIAGNOSIS — R110 Nausea: Secondary | ICD-10-CM | POA: Diagnosis not present

## 2019-06-10 DIAGNOSIS — R198 Other specified symptoms and signs involving the digestive system and abdomen: Secondary | ICD-10-CM | POA: Diagnosis not present

## 2019-06-11 DIAGNOSIS — M62838 Other muscle spasm: Secondary | ICD-10-CM | POA: Diagnosis not present

## 2019-06-11 DIAGNOSIS — M47812 Spondylosis without myelopathy or radiculopathy, cervical region: Secondary | ICD-10-CM | POA: Diagnosis not present

## 2019-06-16 DIAGNOSIS — M47812 Spondylosis without myelopathy or radiculopathy, cervical region: Secondary | ICD-10-CM | POA: Diagnosis not present

## 2019-06-16 DIAGNOSIS — M62838 Other muscle spasm: Secondary | ICD-10-CM | POA: Diagnosis not present

## 2019-06-18 DIAGNOSIS — M9904 Segmental and somatic dysfunction of sacral region: Secondary | ICD-10-CM | POA: Diagnosis not present

## 2019-06-18 DIAGNOSIS — M5136 Other intervertebral disc degeneration, lumbar region: Secondary | ICD-10-CM | POA: Diagnosis not present

## 2019-06-18 DIAGNOSIS — M9901 Segmental and somatic dysfunction of cervical region: Secondary | ICD-10-CM | POA: Diagnosis not present

## 2019-06-18 DIAGNOSIS — S338XXA Sprain of other parts of lumbar spine and pelvis, initial encounter: Secondary | ICD-10-CM | POA: Diagnosis not present

## 2019-06-18 DIAGNOSIS — M9902 Segmental and somatic dysfunction of thoracic region: Secondary | ICD-10-CM | POA: Diagnosis not present

## 2019-06-18 DIAGNOSIS — M9903 Segmental and somatic dysfunction of lumbar region: Secondary | ICD-10-CM | POA: Diagnosis not present

## 2019-06-18 DIAGNOSIS — M47812 Spondylosis without myelopathy or radiculopathy, cervical region: Secondary | ICD-10-CM | POA: Diagnosis not present

## 2019-06-18 DIAGNOSIS — S29012A Strain of muscle and tendon of back wall of thorax, initial encounter: Secondary | ICD-10-CM | POA: Diagnosis not present

## 2019-06-19 DIAGNOSIS — M62838 Other muscle spasm: Secondary | ICD-10-CM | POA: Diagnosis not present

## 2019-06-19 DIAGNOSIS — M47812 Spondylosis without myelopathy or radiculopathy, cervical region: Secondary | ICD-10-CM | POA: Diagnosis not present

## 2019-06-22 DIAGNOSIS — M62838 Other muscle spasm: Secondary | ICD-10-CM | POA: Diagnosis not present

## 2019-06-22 DIAGNOSIS — M47812 Spondylosis without myelopathy or radiculopathy, cervical region: Secondary | ICD-10-CM | POA: Diagnosis not present

## 2019-06-23 ENCOUNTER — Ambulatory Visit (INDEPENDENT_AMBULATORY_CARE_PROVIDER_SITE_OTHER): Payer: Medicare Other | Admitting: Adult Health

## 2019-06-23 ENCOUNTER — Other Ambulatory Visit: Payer: Self-pay

## 2019-06-23 ENCOUNTER — Encounter: Payer: Self-pay | Admitting: Adult Health

## 2019-06-23 DIAGNOSIS — G4700 Insomnia, unspecified: Secondary | ICD-10-CM

## 2019-06-23 DIAGNOSIS — F411 Generalized anxiety disorder: Secondary | ICD-10-CM | POA: Diagnosis not present

## 2019-06-23 DIAGNOSIS — F331 Major depressive disorder, recurrent, moderate: Secondary | ICD-10-CM | POA: Diagnosis not present

## 2019-06-23 MED ORDER — CLONAZEPAM 1 MG PO TABS: 1.0000 mg | ORAL_TABLET | Freq: Three times a day (TID) | ORAL | 1 refills | Status: DC | PRN
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
## 2019-06-24 DIAGNOSIS — S338XXA Sprain of other parts of lumbar spine and pelvis, initial encounter: Secondary | ICD-10-CM | POA: Diagnosis not present

## 2019-06-24 DIAGNOSIS — M5136 Other intervertebral disc degeneration, lumbar region: Secondary | ICD-10-CM | POA: Diagnosis not present

## 2019-06-24 DIAGNOSIS — S29012A Strain of muscle and tendon of back wall of thorax, initial encounter: Secondary | ICD-10-CM | POA: Diagnosis not present

## 2019-06-24 DIAGNOSIS — M9903 Segmental and somatic dysfunction of lumbar region: Secondary | ICD-10-CM | POA: Diagnosis not present

## 2019-06-24 DIAGNOSIS — M9904 Segmental and somatic dysfunction of sacral region: Secondary | ICD-10-CM | POA: Diagnosis not present

## 2019-06-24 DIAGNOSIS — M9901 Segmental and somatic dysfunction of cervical region: Secondary | ICD-10-CM | POA: Diagnosis not present

## 2019-06-24 DIAGNOSIS — M47812 Spondylosis without myelopathy or radiculopathy, cervical region: Secondary | ICD-10-CM | POA: Diagnosis not present

## 2019-06-24 DIAGNOSIS — M9902 Segmental and somatic dysfunction of thoracic region: Secondary | ICD-10-CM | POA: Diagnosis not present

## 2019-06-25 ENCOUNTER — Other Ambulatory Visit: Payer: Self-pay | Admitting: Adult Health

## 2019-06-25 DIAGNOSIS — F331 Major depressive disorder, recurrent, moderate: Secondary | ICD-10-CM

## 2019-06-25 DIAGNOSIS — M62838 Other muscle spasm: Secondary | ICD-10-CM | POA: Diagnosis not present

## 2019-06-25 DIAGNOSIS — F411 Generalized anxiety disorder: Secondary | ICD-10-CM

## 2019-06-25 DIAGNOSIS — M47812 Spondylosis without myelopathy or radiculopathy, cervical region: Secondary | ICD-10-CM | POA: Diagnosis not present

## 2019-06-25 MED ORDER — FLUOXETINE HCL 20 MG PO CAPS: 20.0000 mg | ORAL_CAPSULE | Freq: Every day | ORAL | 2 refills | Status: DC

## 2019-06-29 ENCOUNTER — Telehealth: Payer: Self-pay | Admitting: Adult Health

## 2019-06-29 DIAGNOSIS — F411 Generalized anxiety disorder: Secondary | ICD-10-CM

## 2019-06-29 DIAGNOSIS — M9903 Segmental and somatic dysfunction of lumbar region: Secondary | ICD-10-CM | POA: Diagnosis not present

## 2019-06-29 DIAGNOSIS — S29012A Strain of muscle and tendon of back wall of thorax, initial encounter: Secondary | ICD-10-CM | POA: Diagnosis not present

## 2019-06-29 DIAGNOSIS — M9904 Segmental and somatic dysfunction of sacral region: Secondary | ICD-10-CM | POA: Diagnosis not present

## 2019-06-29 DIAGNOSIS — M47812 Spondylosis without myelopathy or radiculopathy, cervical region: Secondary | ICD-10-CM | POA: Diagnosis not present

## 2019-06-29 DIAGNOSIS — S338XXA Sprain of other parts of lumbar spine and pelvis, initial encounter: Secondary | ICD-10-CM | POA: Diagnosis not present

## 2019-06-29 DIAGNOSIS — M9902 Segmental and somatic dysfunction of thoracic region: Secondary | ICD-10-CM | POA: Diagnosis not present

## 2019-06-29 DIAGNOSIS — F331 Major depressive disorder, recurrent, moderate: Secondary | ICD-10-CM

## 2019-06-29 DIAGNOSIS — M5136 Other intervertebral disc degeneration, lumbar region: Secondary | ICD-10-CM | POA: Diagnosis not present

## 2019-06-29 DIAGNOSIS — M9901 Segmental and somatic dysfunction of cervical region: Secondary | ICD-10-CM | POA: Diagnosis not present

## 2019-06-29 MED ORDER — FLUOXETINE HCL 20 MG PO CAPS: 20.0000 mg | ORAL_CAPSULE | Freq: Every day | ORAL | 2 refills | Status: DC

## 2019-06-29 NOTE — Telephone Encounter (Signed)
Pt called stated pharmacy canc Prozac Rx. Please call to correct . [place] [address]

## 2019-06-30 DIAGNOSIS — G8929 Other chronic pain: Secondary | ICD-10-CM | POA: Diagnosis not present

## 2019-06-30 DIAGNOSIS — M1711 Unilateral primary osteoarthritis, right knee: Secondary | ICD-10-CM | POA: Diagnosis not present

## 2019-06-30 DIAGNOSIS — M5136 Other intervertebral disc degeneration, lumbar region: Secondary | ICD-10-CM | POA: Diagnosis not present

## 2019-06-30 DIAGNOSIS — M47817 Spondylosis without myelopathy or radiculopathy, lumbosacral region: Secondary | ICD-10-CM | POA: Diagnosis not present

## 2019-07-03 DIAGNOSIS — M62838 Other muscle spasm: Secondary | ICD-10-CM | POA: Diagnosis not present

## 2019-07-03 DIAGNOSIS — M47812 Spondylosis without myelopathy or radiculopathy, cervical region: Secondary | ICD-10-CM | POA: Diagnosis not present

## 2019-07-06 DIAGNOSIS — M47812 Spondylosis without myelopathy or radiculopathy, cervical region: Secondary | ICD-10-CM | POA: Diagnosis not present

## 2019-07-06 DIAGNOSIS — G894 Chronic pain syndrome: Secondary | ICD-10-CM | POA: Diagnosis not present

## 2019-07-06 DIAGNOSIS — M62838 Other muscle spasm: Secondary | ICD-10-CM | POA: Diagnosis not present

## 2019-07-06 DIAGNOSIS — Z79891 Long term (current) use of opiate analgesic: Secondary | ICD-10-CM | POA: Diagnosis not present

## 2019-07-07 DIAGNOSIS — Z96651 Presence of right artificial knee joint: Secondary | ICD-10-CM | POA: Diagnosis not present

## 2019-07-08 ENCOUNTER — Other Ambulatory Visit (HOSPITAL_COMMUNITY): Payer: Self-pay | Admitting: Orthopedic Surgery

## 2019-07-08 DIAGNOSIS — M62838 Other muscle spasm: Secondary | ICD-10-CM | POA: Diagnosis not present

## 2019-07-08 DIAGNOSIS — S338XXA Sprain of other parts of lumbar spine and pelvis, initial encounter: Secondary | ICD-10-CM | POA: Diagnosis not present

## 2019-07-08 DIAGNOSIS — S29012A Strain of muscle and tendon of back wall of thorax, initial encounter: Secondary | ICD-10-CM | POA: Diagnosis not present

## 2019-07-08 DIAGNOSIS — T8484XA Pain due to internal orthopedic prosthetic devices, implants and grafts, initial encounter: Secondary | ICD-10-CM

## 2019-07-08 DIAGNOSIS — M9903 Segmental and somatic dysfunction of lumbar region: Secondary | ICD-10-CM | POA: Diagnosis not present

## 2019-07-08 DIAGNOSIS — M5136 Other intervertebral disc degeneration, lumbar region: Secondary | ICD-10-CM | POA: Diagnosis not present

## 2019-07-08 DIAGNOSIS — M9901 Segmental and somatic dysfunction of cervical region: Secondary | ICD-10-CM | POA: Diagnosis not present

## 2019-07-08 DIAGNOSIS — M47812 Spondylosis without myelopathy or radiculopathy, cervical region: Secondary | ICD-10-CM | POA: Diagnosis not present

## 2019-07-08 DIAGNOSIS — M9902 Segmental and somatic dysfunction of thoracic region: Secondary | ICD-10-CM | POA: Diagnosis not present

## 2019-07-08 DIAGNOSIS — M9904 Segmental and somatic dysfunction of sacral region: Secondary | ICD-10-CM | POA: Diagnosis not present

## 2019-07-09 DIAGNOSIS — Z23 Encounter for immunization: Secondary | ICD-10-CM | POA: Diagnosis not present

## 2019-07-13 DIAGNOSIS — M62838 Other muscle spasm: Secondary | ICD-10-CM | POA: Diagnosis not present

## 2019-07-13 DIAGNOSIS — M47812 Spondylosis without myelopathy or radiculopathy, cervical region: Secondary | ICD-10-CM | POA: Diagnosis not present

## 2019-07-15 ENCOUNTER — Encounter (HOSPITAL_COMMUNITY): Payer: Medicare Other

## 2019-07-15 ENCOUNTER — Other Ambulatory Visit: Payer: Self-pay | Admitting: Sports Medicine

## 2019-07-15 ENCOUNTER — Other Ambulatory Visit (HOSPITAL_COMMUNITY): Payer: Medicare Other

## 2019-07-15 DIAGNOSIS — M47812 Spondylosis without myelopathy or radiculopathy, cervical region: Secondary | ICD-10-CM | POA: Diagnosis not present

## 2019-07-15 DIAGNOSIS — M542 Cervicalgia: Secondary | ICD-10-CM

## 2019-07-21 ENCOUNTER — Ambulatory Visit: Payer: Medicare Other | Admitting: Adult Health

## 2019-07-21 DIAGNOSIS — M9902 Segmental and somatic dysfunction of thoracic region: Secondary | ICD-10-CM | POA: Diagnosis not present

## 2019-07-21 DIAGNOSIS — M9904 Segmental and somatic dysfunction of sacral region: Secondary | ICD-10-CM | POA: Diagnosis not present

## 2019-07-21 DIAGNOSIS — S338XXA Sprain of other parts of lumbar spine and pelvis, initial encounter: Secondary | ICD-10-CM | POA: Diagnosis not present

## 2019-07-21 DIAGNOSIS — M9903 Segmental and somatic dysfunction of lumbar region: Secondary | ICD-10-CM | POA: Diagnosis not present

## 2019-07-21 DIAGNOSIS — S29012A Strain of muscle and tendon of back wall of thorax, initial encounter: Secondary | ICD-10-CM | POA: Diagnosis not present

## 2019-07-21 DIAGNOSIS — M47812 Spondylosis without myelopathy or radiculopathy, cervical region: Secondary | ICD-10-CM | POA: Diagnosis not present

## 2019-07-21 DIAGNOSIS — M9901 Segmental and somatic dysfunction of cervical region: Secondary | ICD-10-CM | POA: Diagnosis not present

## 2019-07-21 DIAGNOSIS — M5136 Other intervertebral disc degeneration, lumbar region: Secondary | ICD-10-CM | POA: Diagnosis not present

## 2019-07-21 DIAGNOSIS — M62838 Other muscle spasm: Secondary | ICD-10-CM | POA: Diagnosis not present

## 2019-07-22 ENCOUNTER — Ambulatory Visit: Payer: Medicare Other | Admitting: Adult Health

## 2019-07-24 DIAGNOSIS — R110 Nausea: Secondary | ICD-10-CM | POA: Diagnosis not present

## 2019-07-24 DIAGNOSIS — K219 Gastro-esophageal reflux disease without esophagitis: Secondary | ICD-10-CM | POA: Diagnosis not present

## 2019-07-24 DIAGNOSIS — R1013 Epigastric pain: Secondary | ICD-10-CM | POA: Diagnosis not present

## 2019-07-24 DIAGNOSIS — M47812 Spondylosis without myelopathy or radiculopathy, cervical region: Secondary | ICD-10-CM | POA: Diagnosis not present

## 2019-07-24 DIAGNOSIS — M62838 Other muscle spasm: Secondary | ICD-10-CM | POA: Diagnosis not present

## 2019-07-24 DIAGNOSIS — R198 Other specified symptoms and signs involving the digestive system and abdomen: Secondary | ICD-10-CM | POA: Diagnosis not present

## 2019-07-25 ENCOUNTER — Other Ambulatory Visit: Payer: Self-pay | Admitting: Neurology

## 2019-07-27 ENCOUNTER — Encounter (HOSPITAL_COMMUNITY): Payer: Medicare Other

## 2019-07-27 ENCOUNTER — Encounter (HOSPITAL_COMMUNITY)
Admission: RE | Admit: 2019-07-27 | Discharge: 2019-07-27 | Disposition: A | Discharge status: Home / Self Care | Payer: Medicare Other | Source: Ambulatory Visit | Attending: Orthopedic Surgery | Admitting: Orthopedic Surgery

## 2019-07-27 ENCOUNTER — Encounter (HOSPITAL_COMMUNITY): Payer: Self-pay

## 2019-07-27 ENCOUNTER — Other Ambulatory Visit: Payer: Self-pay

## 2019-07-27 DIAGNOSIS — T8484XA Pain due to internal orthopedic prosthetic devices, implants and grafts, initial encounter: Secondary | ICD-10-CM

## 2019-07-27 DIAGNOSIS — Z96652 Presence of left artificial knee joint: Secondary | ICD-10-CM | POA: Insufficient documentation

## 2019-07-27 MED ORDER — PREGABALIN 100 MG PO CAPS: 100.0000 mg | ORAL_CAPSULE | Freq: Two times a day (BID) | ORAL | 5 refills | Status: DC

## 2019-07-29 DIAGNOSIS — S29012A Strain of muscle and tendon of back wall of thorax, initial encounter: Secondary | ICD-10-CM | POA: Diagnosis not present

## 2019-07-29 DIAGNOSIS — M5136 Other intervertebral disc degeneration, lumbar region: Secondary | ICD-10-CM | POA: Diagnosis not present

## 2019-07-29 DIAGNOSIS — S338XXA Sprain of other parts of lumbar spine and pelvis, initial encounter: Secondary | ICD-10-CM | POA: Diagnosis not present

## 2019-07-29 DIAGNOSIS — M9901 Segmental and somatic dysfunction of cervical region: Secondary | ICD-10-CM | POA: Diagnosis not present

## 2019-07-29 DIAGNOSIS — M9903 Segmental and somatic dysfunction of lumbar region: Secondary | ICD-10-CM | POA: Diagnosis not present

## 2019-07-29 DIAGNOSIS — M9904 Segmental and somatic dysfunction of sacral region: Secondary | ICD-10-CM | POA: Diagnosis not present

## 2019-07-29 DIAGNOSIS — M9902 Segmental and somatic dysfunction of thoracic region: Secondary | ICD-10-CM | POA: Diagnosis not present

## 2019-07-29 DIAGNOSIS — M47812 Spondylosis without myelopathy or radiculopathy, cervical region: Secondary | ICD-10-CM | POA: Diagnosis not present

## 2019-07-30 ENCOUNTER — Encounter (HOSPITAL_COMMUNITY)
Admission: RE | Admit: 2019-07-30 | Discharge: 2019-07-30 | Disposition: A | Discharge status: Home / Self Care | Payer: Medicare Other | Source: Ambulatory Visit | Attending: Orthopedic Surgery | Admitting: Orthopedic Surgery

## 2019-07-30 ENCOUNTER — Telehealth: Payer: Self-pay

## 2019-07-30 ENCOUNTER — Other Ambulatory Visit: Payer: Self-pay

## 2019-07-30 ENCOUNTER — Telehealth: Payer: Self-pay | Admitting: Adult Health

## 2019-07-30 DIAGNOSIS — R6889 Other general symptoms and signs: Secondary | ICD-10-CM | POA: Diagnosis not present

## 2019-07-30 DIAGNOSIS — Z96651 Presence of right artificial knee joint: Secondary | ICD-10-CM | POA: Insufficient documentation

## 2019-07-30 DIAGNOSIS — M25561 Pain in right knee: Secondary | ICD-10-CM | POA: Diagnosis not present

## 2019-07-30 NOTE — Telephone Encounter (Signed)
Prior authorization submitted for Klonopin 1 mg tid #90/30 days through [place] Medicare Part D. Effective 05/01/2019-07/29/2020 [facility] pharmacy aware and will fill for her today.

## 2019-07-30 NOTE — Telephone Encounter (Signed)
[place] to let them know we did renew her PA now since it was due to come up and they will go ahead and fill her Rx today. Pt took last dose today and confirmed she's taking 3/day.

## 2019-07-30 NOTE — Telephone Encounter (Signed)
Patient called and said that she needs a pa on her klonopin. Said that the pharmacy sent it but I think they sent it to [facility] old [facility]. She took her last dos eof her klonopin today. Pharmacy faxed [name] 3 days ago. I told her to tell the pharmacy our fax number instead

## 2019-07-30 NOTE — Telephone Encounter (Signed)
Patient's last refill for #90 was on 07/04/2019, pt trying to fill too soon. Pt already has a PA on file. Will confirm she's not taking more than prescribed and check with [facility] for an early refill.

## 2019-08-01 ENCOUNTER — Other Ambulatory Visit: Payer: Self-pay

## 2019-08-01 ENCOUNTER — Ambulatory Visit
Admission: RE | Admit: 2019-08-01 | Discharge: 2019-08-01 | Disposition: A | Discharge status: Home / Self Care | Payer: Medicare Other | Source: Ambulatory Visit | Attending: Sports Medicine | Admitting: Sports Medicine

## 2019-08-01 DIAGNOSIS — M542 Cervicalgia: Secondary | ICD-10-CM

## 2019-08-01 DIAGNOSIS — M4802 Spinal stenosis, cervical region: Secondary | ICD-10-CM | POA: Diagnosis not present

## 2019-08-03 ENCOUNTER — Ambulatory Visit (INDEPENDENT_AMBULATORY_CARE_PROVIDER_SITE_OTHER): Payer: Medicare Other | Admitting: Podiatry

## 2019-08-03 ENCOUNTER — Other Ambulatory Visit: Payer: Self-pay

## 2019-08-03 DIAGNOSIS — M9903 Segmental and somatic dysfunction of lumbar region: Secondary | ICD-10-CM | POA: Diagnosis not present

## 2019-08-03 DIAGNOSIS — M9901 Segmental and somatic dysfunction of cervical region: Secondary | ICD-10-CM | POA: Diagnosis not present

## 2019-08-03 DIAGNOSIS — R600 Localized edema: Secondary | ICD-10-CM

## 2019-08-03 DIAGNOSIS — S338XXA Sprain of other parts of lumbar spine and pelvis, initial encounter: Secondary | ICD-10-CM | POA: Diagnosis not present

## 2019-08-03 DIAGNOSIS — M9902 Segmental and somatic dysfunction of thoracic region: Secondary | ICD-10-CM | POA: Diagnosis not present

## 2019-08-03 DIAGNOSIS — S29012A Strain of muscle and tendon of back wall of thorax, initial encounter: Secondary | ICD-10-CM | POA: Diagnosis not present

## 2019-08-03 DIAGNOSIS — M9904 Segmental and somatic dysfunction of sacral region: Secondary | ICD-10-CM | POA: Diagnosis not present

## 2019-08-03 DIAGNOSIS — M47812 Spondylosis without myelopathy or radiculopathy, cervical region: Secondary | ICD-10-CM | POA: Diagnosis not present

## 2019-08-03 DIAGNOSIS — G629 Polyneuropathy, unspecified: Secondary | ICD-10-CM

## 2019-08-03 DIAGNOSIS — M5136 Other intervertebral disc degeneration, lumbar region: Secondary | ICD-10-CM | POA: Diagnosis not present

## 2019-08-04 DIAGNOSIS — G8929 Other chronic pain: Secondary | ICD-10-CM | POA: Diagnosis not present

## 2019-08-04 DIAGNOSIS — M5136 Other intervertebral disc degeneration, lumbar region: Secondary | ICD-10-CM | POA: Diagnosis not present

## 2019-08-04 DIAGNOSIS — M1711 Unilateral primary osteoarthritis, right knee: Secondary | ICD-10-CM | POA: Diagnosis not present

## 2019-08-04 DIAGNOSIS — G629 Polyneuropathy, unspecified: Secondary | ICD-10-CM | POA: Diagnosis not present

## 2019-08-05 DIAGNOSIS — M47812 Spondylosis without myelopathy or radiculopathy, cervical region: Secondary | ICD-10-CM | POA: Diagnosis not present

## 2019-08-06 ENCOUNTER — Encounter: Payer: Self-pay | Admitting: Adult Health

## 2019-08-06 ENCOUNTER — Ambulatory Visit (INDEPENDENT_AMBULATORY_CARE_PROVIDER_SITE_OTHER): Payer: Medicare Other | Admitting: Adult Health

## 2019-08-06 DIAGNOSIS — G4700 Insomnia, unspecified: Secondary | ICD-10-CM | POA: Diagnosis not present

## 2019-08-06 DIAGNOSIS — F411 Generalized anxiety disorder: Secondary | ICD-10-CM

## 2019-08-06 DIAGNOSIS — F331 Major depressive disorder, recurrent, moderate: Secondary | ICD-10-CM | POA: Diagnosis not present

## 2019-08-06 MED ORDER — CLONAZEPAM 1 MG PO TABS: 1.0000 mg | ORAL_TABLET | Freq: Three times a day (TID) | ORAL | 2 refills | Status: DC | PRN

## 2019-08-06 MED ORDER — FLUOXETINE HCL 20 MG PO CAPS: 20.0000 mg | ORAL_CAPSULE | Freq: Every day | ORAL | 3 refills | Status: DC

## 2019-08-06 NOTE — Progress Notes (Signed)
   HPI: 68 y.o. female presenting today with a chief compliant of pain to the plantar left heel, forefoot and dorsal foot that began a few months ago. She reports associated redness and swelling of the areas. Being on the foot increases the symptoms. She has not had any treatment. She reports h/o neuropathy. Patient is here for further evaluation and treatment.   Past Medical History:  Diagnosis Date  . Anxiety    [facility]  . Arthritis   . Cataract    THESE BEEN REPLACED  . Depression    [facility]  . Diverticulosis   . Dry eye syndrome   . GERD (gastroesophageal reflux disease)   . History of colon polyps 12/03/2012   COLONOSCOPY  . History of exercise stress test 2010   Dr. Ashok Norris   . Hyperlipidemia   . Hypertension   . Hypothyroidism   . Multinodular goiter    DR. KERR  . OSA (obstructive sleep apnea)    (PSG 12/12/15 ESS 2, AHI 42/HR REM 30/HR, O2 MIN 80%)  CPAP- q night , done with [facility]  study done at [facility]  . Squamous cell carcinoma in situ 2015  . Thyroid disease    Nodules  . Tubular adenoma    DR. [place]  . Varicose veins      Physical Exam: General: The patient is alert and oriented x3 in no acute distress.  Dermatology: Skin is warm, dry and supple bilateral lower extremities. Negative for open lesions or macerations.  Vascular: Edema noted to the bilateral lower extremities. Palpable pedal pulses bilaterally. No erythema noted. Capillary refill within normal limits.  Neurological: Epicritic and protective threshold diminished bilaterally.   Musculoskeletal Exam: Pain with palpation noted to the metatarsal heads of the left foot. Range of motion within normal limits to all pedal and ankle joints bilateral. Muscle strength 5/5 in all groups bilateral.   Assessment: 1. Peripheral neuropathy BLE 2. Metatarsalgia left  3. Edema BLE   Plan of Care:  1. Patient evaluated.  2. Continue taking Lyrica as directed by  neurology.  3. Compression anklet dispensed.  4. Recommended compression socks.  5. Return to clinic as needed.       Edrick Kins, DPM [facility]  Dr. Edrick Kins, DPM    [address], [place] [address]                Office [phone]  Fax [phone]
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
## 2019-08-10 DIAGNOSIS — Z79899 Other long term (current) drug therapy: Secondary | ICD-10-CM | POA: Diagnosis not present

## 2019-08-10 DIAGNOSIS — M9902 Segmental and somatic dysfunction of thoracic region: Secondary | ICD-10-CM | POA: Diagnosis not present

## 2019-08-10 DIAGNOSIS — M5136 Other intervertebral disc degeneration, lumbar region: Secondary | ICD-10-CM | POA: Diagnosis not present

## 2019-08-10 DIAGNOSIS — M9901 Segmental and somatic dysfunction of cervical region: Secondary | ICD-10-CM | POA: Diagnosis not present

## 2019-08-10 DIAGNOSIS — M9904 Segmental and somatic dysfunction of sacral region: Secondary | ICD-10-CM | POA: Diagnosis not present

## 2019-08-10 DIAGNOSIS — G894 Chronic pain syndrome: Secondary | ICD-10-CM | POA: Diagnosis not present

## 2019-08-10 DIAGNOSIS — S29012A Strain of muscle and tendon of back wall of thorax, initial encounter: Secondary | ICD-10-CM | POA: Diagnosis not present

## 2019-08-10 DIAGNOSIS — M47812 Spondylosis without myelopathy or radiculopathy, cervical region: Secondary | ICD-10-CM | POA: Diagnosis not present

## 2019-08-10 DIAGNOSIS — M9903 Segmental and somatic dysfunction of lumbar region: Secondary | ICD-10-CM | POA: Diagnosis not present

## 2019-08-10 DIAGNOSIS — S338XXA Sprain of other parts of lumbar spine and pelvis, initial encounter: Secondary | ICD-10-CM | POA: Diagnosis not present

## 2019-08-11 DIAGNOSIS — M25561 Pain in right knee: Secondary | ICD-10-CM | POA: Diagnosis not present

## 2019-08-11 DIAGNOSIS — Z96651 Presence of right artificial knee joint: Secondary | ICD-10-CM | POA: Diagnosis not present

## 2019-08-11 DIAGNOSIS — M25861 Other specified joint disorders, right knee: Secondary | ICD-10-CM | POA: Diagnosis not present

## 2019-08-12 DIAGNOSIS — M5136 Other intervertebral disc degeneration, lumbar region: Secondary | ICD-10-CM | POA: Diagnosis not present

## 2019-08-12 DIAGNOSIS — S338XXA Sprain of other parts of lumbar spine and pelvis, initial encounter: Secondary | ICD-10-CM | POA: Diagnosis not present

## 2019-08-12 DIAGNOSIS — M9901 Segmental and somatic dysfunction of cervical region: Secondary | ICD-10-CM | POA: Diagnosis not present

## 2019-08-12 DIAGNOSIS — S29012A Strain of muscle and tendon of back wall of thorax, initial encounter: Secondary | ICD-10-CM | POA: Diagnosis not present

## 2019-08-12 DIAGNOSIS — M47812 Spondylosis without myelopathy or radiculopathy, cervical region: Secondary | ICD-10-CM | POA: Diagnosis not present

## 2019-08-12 DIAGNOSIS — M9904 Segmental and somatic dysfunction of sacral region: Secondary | ICD-10-CM | POA: Diagnosis not present

## 2019-08-12 DIAGNOSIS — M9903 Segmental and somatic dysfunction of lumbar region: Secondary | ICD-10-CM | POA: Diagnosis not present

## 2019-08-12 DIAGNOSIS — M9902 Segmental and somatic dysfunction of thoracic region: Secondary | ICD-10-CM | POA: Diagnosis not present

## 2019-08-13 DIAGNOSIS — K219 Gastro-esophageal reflux disease without esophagitis: Secondary | ICD-10-CM | POA: Diagnosis not present

## 2019-08-13 DIAGNOSIS — F419 Anxiety disorder, unspecified: Secondary | ICD-10-CM | POA: Diagnosis not present

## 2019-08-13 DIAGNOSIS — K130 Diseases of lips: Secondary | ICD-10-CM | POA: Diagnosis not present

## 2019-08-13 DIAGNOSIS — E538 Deficiency of other specified B group vitamins: Secondary | ICD-10-CM | POA: Diagnosis not present

## 2019-08-13 DIAGNOSIS — E039 Hypothyroidism, unspecified: Secondary | ICD-10-CM | POA: Diagnosis not present

## 2019-08-13 DIAGNOSIS — I10 Essential (primary) hypertension: Secondary | ICD-10-CM | POA: Diagnosis not present

## 2019-08-20 DIAGNOSIS — S29012A Strain of muscle and tendon of back wall of thorax, initial encounter: Secondary | ICD-10-CM | POA: Diagnosis not present

## 2019-08-20 DIAGNOSIS — M47812 Spondylosis without myelopathy or radiculopathy, cervical region: Secondary | ICD-10-CM | POA: Diagnosis not present

## 2019-08-20 DIAGNOSIS — S338XXA Sprain of other parts of lumbar spine and pelvis, initial encounter: Secondary | ICD-10-CM | POA: Diagnosis not present

## 2019-08-20 DIAGNOSIS — M9901 Segmental and somatic dysfunction of cervical region: Secondary | ICD-10-CM | POA: Diagnosis not present

## 2019-08-20 DIAGNOSIS — M5136 Other intervertebral disc degeneration, lumbar region: Secondary | ICD-10-CM | POA: Diagnosis not present

## 2019-08-20 DIAGNOSIS — M9903 Segmental and somatic dysfunction of lumbar region: Secondary | ICD-10-CM | POA: Diagnosis not present

## 2019-08-20 DIAGNOSIS — M9904 Segmental and somatic dysfunction of sacral region: Secondary | ICD-10-CM | POA: Diagnosis not present

## 2019-08-20 DIAGNOSIS — M9902 Segmental and somatic dysfunction of thoracic region: Secondary | ICD-10-CM | POA: Diagnosis not present

## 2019-08-20 DIAGNOSIS — M62838 Other muscle spasm: Secondary | ICD-10-CM | POA: Diagnosis not present

## 2019-08-24 DIAGNOSIS — M5136 Other intervertebral disc degeneration, lumbar region: Secondary | ICD-10-CM | POA: Diagnosis not present

## 2019-08-24 DIAGNOSIS — S29012A Strain of muscle and tendon of back wall of thorax, initial encounter: Secondary | ICD-10-CM | POA: Diagnosis not present

## 2019-08-24 DIAGNOSIS — S338XXA Sprain of other parts of lumbar spine and pelvis, initial encounter: Secondary | ICD-10-CM | POA: Diagnosis not present

## 2019-08-24 DIAGNOSIS — M9902 Segmental and somatic dysfunction of thoracic region: Secondary | ICD-10-CM | POA: Diagnosis not present

## 2019-08-24 DIAGNOSIS — M47812 Spondylosis without myelopathy or radiculopathy, cervical region: Secondary | ICD-10-CM | POA: Diagnosis not present

## 2019-08-24 DIAGNOSIS — M9901 Segmental and somatic dysfunction of cervical region: Secondary | ICD-10-CM | POA: Diagnosis not present

## 2019-08-24 DIAGNOSIS — M9903 Segmental and somatic dysfunction of lumbar region: Secondary | ICD-10-CM | POA: Diagnosis not present

## 2019-08-24 DIAGNOSIS — M9904 Segmental and somatic dysfunction of sacral region: Secondary | ICD-10-CM | POA: Diagnosis not present

## 2019-08-25 DIAGNOSIS — M47812 Spondylosis without myelopathy or radiculopathy, cervical region: Secondary | ICD-10-CM | POA: Diagnosis not present

## 2019-08-25 DIAGNOSIS — M62838 Other muscle spasm: Secondary | ICD-10-CM | POA: Diagnosis not present

## 2019-09-01 DIAGNOSIS — M47817 Spondylosis without myelopathy or radiculopathy, lumbosacral region: Secondary | ICD-10-CM | POA: Diagnosis not present

## 2019-09-01 DIAGNOSIS — G8929 Other chronic pain: Secondary | ICD-10-CM | POA: Diagnosis not present

## 2019-09-01 DIAGNOSIS — M1711 Unilateral primary osteoarthritis, right knee: Secondary | ICD-10-CM | POA: Diagnosis not present

## 2019-09-01 DIAGNOSIS — G894 Chronic pain syndrome: Secondary | ICD-10-CM | POA: Diagnosis not present

## 2019-09-01 DIAGNOSIS — Z79891 Long term (current) use of opiate analgesic: Secondary | ICD-10-CM | POA: Diagnosis not present

## 2019-09-01 DIAGNOSIS — G629 Polyneuropathy, unspecified: Secondary | ICD-10-CM | POA: Diagnosis not present

## 2019-09-01 DIAGNOSIS — Z79899 Other long term (current) drug therapy: Secondary | ICD-10-CM | POA: Diagnosis not present

## 2019-09-03 DIAGNOSIS — M9901 Segmental and somatic dysfunction of cervical region: Secondary | ICD-10-CM | POA: Diagnosis not present

## 2019-09-03 DIAGNOSIS — S29012A Strain of muscle and tendon of back wall of thorax, initial encounter: Secondary | ICD-10-CM | POA: Diagnosis not present

## 2019-09-03 DIAGNOSIS — M9904 Segmental and somatic dysfunction of sacral region: Secondary | ICD-10-CM | POA: Diagnosis not present

## 2019-09-03 DIAGNOSIS — M9902 Segmental and somatic dysfunction of thoracic region: Secondary | ICD-10-CM | POA: Diagnosis not present

## 2019-09-03 DIAGNOSIS — M47812 Spondylosis without myelopathy or radiculopathy, cervical region: Secondary | ICD-10-CM | POA: Diagnosis not present

## 2019-09-03 DIAGNOSIS — M9903 Segmental and somatic dysfunction of lumbar region: Secondary | ICD-10-CM | POA: Diagnosis not present

## 2019-09-03 DIAGNOSIS — S338XXA Sprain of other parts of lumbar spine and pelvis, initial encounter: Secondary | ICD-10-CM | POA: Diagnosis not present

## 2019-09-03 DIAGNOSIS — M5136 Other intervertebral disc degeneration, lumbar region: Secondary | ICD-10-CM | POA: Diagnosis not present

## 2019-10-05 ENCOUNTER — Encounter: Payer: Self-pay | Admitting: Neurology

## 2019-10-05 ENCOUNTER — Other Ambulatory Visit: Payer: Self-pay

## 2019-10-05 ENCOUNTER — Telehealth (INDEPENDENT_AMBULATORY_CARE_PROVIDER_SITE_OTHER): Payer: Medicare Other | Admitting: Neurology

## 2019-10-05 VITALS — Ht 67.0 in | Wt 235.0 lb

## 2019-10-05 DIAGNOSIS — M79672 Pain in left foot: Secondary | ICD-10-CM

## 2019-10-05 DIAGNOSIS — M79671 Pain in right foot: Secondary | ICD-10-CM

## 2019-10-05 NOTE — Progress Notes (Signed)
   Virtual Visit via Video Note The purpose of this virtual visit is to provide medical care while limiting exposure to the novel coronavirus.    Consent was obtained for video visit:  Yes.   Answered questions that patient had about telehealth interaction:  Yes.   I discussed the limitations, risks, security and privacy concerns of performing an evaluation and management service by telemedicine. I also discussed with the patient that there may be a patient responsible charge related to this service. The patient expressed understanding and agreed to proceed.  Pt location: Home Physician Location: office Name of referring provider:  [name], MD I connected with Olivia [name] at patients initiation/request on 10/05/2019 at  2:30 PM EST by video enabled telemedicine application and verified that I am speaking with the correct person using two identifiers. Pt MRN:  [id number] Pt DOB:  1951-07-10 Video Participants:  [name]   History of Present Illness: This is a 69 y.o. female returning for follow-up of bilateral feet pain.  She reports having two separate occasions, she was travelling and spending a lot of time walking.  By the evening, her feet were very painful, swollen and red.  She did get some relief with compression stockings and no longer has swelling.  She continues to have sharp, achy, painful feet which she reports is getting worse.  She takes Lyrica 100mg  twice daily for pain which helps.  She also takes hydrocodone for right knee pain, which is prescribed by Dr. Vira Blanco, pain management.   Observations/Objective:   Vitals:   10/05/19 1414  Weight: 235 lb (106.6 kg)  Height: 5\' 7"  (1.702 m)   Patient is awake, alert, and appears comfortable.  Oriented x 4.   Face is symmetric.  Speech is not dysarthric.  Antigravity in the upper extremities Gait not tested  Assessment and Plan:  Chronic bilateral feet pain, worsening.  There are features to her history which is not  typical for neuropathy such as discoloration or swelling and suggested it would be reasonable to either consider repeat NCS/EMG of the legs or get skin biopsy to evaluate for small fiber neuropathy, as prior NCS in 2018 which was reviewed did not show neuropathy.  At this point, we are treating symptoms, without clear diagnosis and her pain has features which can overlap with musculoskeletal pain.  I also offered a second opinion with another provider about her feet pain.  She would like to proceed with repeat EDX of the legs.  Continue Lyrica 100mg  BID for pain.  I did inform patient that when she is already seeing pain management it is best practice to allow them to manage all pain medications as to avoid polypharmacy and try to simplify pain regimen. She had many questions about the nature of neuropathy which was addressed at length.  Further recommendations pending results.   Follow Up Instructions:   I discussed the assessment and treatment plan with the patient. The patient was provided an opportunity to ask questions and all were answered. The patient agreed with the plan and demonstrated an understanding of the instructions.   The patient was advised to call back or seek an in-person evaluation if the symptoms worsen or if the condition fails to improve as anticipated.  [name], DO

## 2019-10-14 ENCOUNTER — Ambulatory Visit: Payer: Medicare Other | Admitting: Cardiology

## 2019-10-15 DIAGNOSIS — S338XXA Sprain of other parts of lumbar spine and pelvis, initial encounter: Secondary | ICD-10-CM | POA: Diagnosis not present

## 2019-10-15 DIAGNOSIS — M9903 Segmental and somatic dysfunction of lumbar region: Secondary | ICD-10-CM | POA: Diagnosis not present

## 2019-10-15 DIAGNOSIS — M5136 Other intervertebral disc degeneration, lumbar region: Secondary | ICD-10-CM | POA: Diagnosis not present

## 2019-10-15 DIAGNOSIS — M9902 Segmental and somatic dysfunction of thoracic region: Secondary | ICD-10-CM | POA: Diagnosis not present

## 2019-10-15 DIAGNOSIS — M47812 Spondylosis without myelopathy or radiculopathy, cervical region: Secondary | ICD-10-CM | POA: Diagnosis not present

## 2019-10-15 DIAGNOSIS — M9901 Segmental and somatic dysfunction of cervical region: Secondary | ICD-10-CM | POA: Diagnosis not present

## 2019-10-15 DIAGNOSIS — M9904 Segmental and somatic dysfunction of sacral region: Secondary | ICD-10-CM | POA: Diagnosis not present

## 2019-10-15 DIAGNOSIS — S29012A Strain of muscle and tendon of back wall of thorax, initial encounter: Secondary | ICD-10-CM | POA: Diagnosis not present

## 2019-10-15 DIAGNOSIS — M4802 Spinal stenosis, cervical region: Secondary | ICD-10-CM | POA: Diagnosis not present

## 2019-10-16 DIAGNOSIS — L649 Androgenic alopecia, unspecified: Secondary | ICD-10-CM | POA: Diagnosis not present

## 2019-10-16 DIAGNOSIS — Z79899 Other long term (current) drug therapy: Secondary | ICD-10-CM | POA: Diagnosis not present

## 2019-10-20 ENCOUNTER — Other Ambulatory Visit: Payer: Self-pay

## 2019-10-20 ENCOUNTER — Ambulatory Visit (INDEPENDENT_AMBULATORY_CARE_PROVIDER_SITE_OTHER): Payer: Medicare Other | Admitting: Neurology

## 2019-10-20 DIAGNOSIS — M79671 Pain in right foot: Secondary | ICD-10-CM | POA: Diagnosis not present

## 2019-10-20 DIAGNOSIS — M5417 Radiculopathy, lumbosacral region: Secondary | ICD-10-CM

## 2019-10-20 DIAGNOSIS — M79672 Pain in left foot: Secondary | ICD-10-CM

## 2019-10-21 NOTE — Procedures (Signed)
[facility]  [address], [place]  [place], [place] [address] Tel: [phone] Fax:  [phone] Test Date:  10/20/2019  Patient: Olivia [name] DOB: 09-27-1951 Physician: Narda Amber, DO  Sex: Female Height: 5\' 7"  Ref Phys: Narda Amber, DO  ID#: [id number] Temp: 32.0 Technician:    Patient Complaints: This is a 69 year-old female referred for evaluation of ongoing bilateral feet pain and paresthesias.  NCV & EMG Findings: Extensive electrodiagnostic testing of the right lower extremity and additional studies of the left shows:  1. Bilateral superficial peroneal and sural sensory responses are within normal limits. 2. Bilateral peroneal and tibial motor responses are within normal limits. 3. Bilateral tibial H reflex studies are within normal limits. 4. Chronic motor axonal loss changes are seen affecting L4-S1 myotomes bilaterally, without accompanied active denervation.   Impression: 1. Chronic L4, L5, and S1 radiculopathies affecting bilateral lower extremities, mild to moderate. As compared to prior study on 03/12/2017 findings are stable. 2. There is no evidence of a large fiber sensorimotor polyneuropathy affecting lower extremities.     ___________________________ Narda Amber, DO    Nerve Conduction Studies Anti Sensory Summary Table   Site NR Peak (ms) Norm Peak (ms) P-T Amp (V) Norm P-T Amp  Left Sup Peroneal Anti Sensory (Ant Lat Mall)  32C  12 cm    2.1 <4.6 8.9 >3  Right Sup Peroneal Anti Sensory (Ant Lat Mall)  32C  12 cm    2.4 <4.6 7.4 >3  Left Sural Anti Sensory (Lat Mall)  32C  Calf    4.1 <4.6 4.3 >3  Right Sural Anti Sensory (Lat Mall)  32C  Calf    3.3 <4.6 8.5 >3   Motor Summary Table   Site NR Onset (ms) Norm Onset (ms) O-P Amp (mV) Norm O-P Amp Site1 Site2 Delta-0 (ms) Dist (cm) Vel (m/s) Norm Vel (m/s)  Left Peroneal Motor (Ext Dig Brev)  32C  Ankle    5.0 <6.0 2.8 >2.5 B Fib Ankle 8.4 35.0 42 >40  B Fib    13.4  2.8   Poplt B Fib 1.4 8.0 57 >40  Poplt    14.8  2.6         Right Peroneal Motor (Ext Dig Brev)  32C  Ankle    4.0 <6.0 6.4 >2.5 B Fib Ankle 7.9 35.0 44 >40  B Fib    11.9  5.8  Poplt B Fib 1.8 9.0 50 >40  Poplt    13.7  5.5         Left Tibial Motor (Abd Hall Brev)  32C  Ankle    3.2 <6.0 6.0 >4 Knee Ankle 10.8 43.0 40 >40  Knee    14.0  3.5         Right Tibial Motor (Abd Hall Brev)  32C  Ankle    3.4 <6.0 5.7 >4 Knee Ankle 10.1 41.0 41 >40  Knee    13.5  3.6          H Reflex Studies   NR H-Lat (ms) Lat Norm (ms) L-R H-Lat (ms)  Left Tibial (Gastroc)  32C     34.42 <35 0.41  Right Tibial (Gastroc)  32C     34.83 <35 0.41   EMG   Side Muscle Ins Act Fibs Psw Fasc Number Recrt Dur Dur. Amp Amp. Poly Poly. Comment  Left Gastroc Nml Nml Nml Nml 2- Rapid Some 1+ Some 1+ Some 1+ N/A  Left Flex  Dig Long Nml Nml Nml Nml 1- Rapid Some 1+ Some 1+ Some 1+ N/A  Left RectFemoris Nml Nml Nml Nml 1- Rapid Some 1+ Some 1+ Some 1+ N/A  Left GluteusMed Nml Nml Nml Nml 1- Rapid Some 1+ Some 1+ Some 1+ N/A  Left AdductorLong Nml Nml Nml Nml Nml Nml Nml Nml Nml Nml Nml Nml N/A  Left AntTibialis Nml Nml Nml Nml 1- Rapid Few 1+ Few 1+ Nml Nml N/A  Right AntTibialis Nml Nml Nml Nml 1- Rapid Few 1+ Some 1+ Few 1+ N/A  Right Gastroc Nml Nml Nml Nml 2- Rapid Some 1+ Some 1+ Some 1+ N/A  Right Flex Dig Long Nml Nml Nml Nml 2- Rapid Some 1+ Some 1+ Some 1+ N/A  Right RectFemoris Nml Nml Nml Nml 1- Rapid Some 1+ Some 1+ Some 1+ N/A  Right GluteusMed Nml Nml Nml Nml 1- Rapid Some 1+ Few 1+ Few 1+ N/A  Right BicepsFemS Nml Nml Nml Nml 1- Rapid Some 1+ Some 1+ Some 1+ N/A      Waveforms:

## 2019-10-26 ENCOUNTER — Encounter: Payer: Self-pay | Admitting: Adult Health

## 2019-10-26 ENCOUNTER — Ambulatory Visit (INDEPENDENT_AMBULATORY_CARE_PROVIDER_SITE_OTHER): Payer: Medicare Other | Admitting: Adult Health

## 2019-10-26 ENCOUNTER — Other Ambulatory Visit: Payer: Self-pay

## 2019-10-26 DIAGNOSIS — F331 Major depressive disorder, recurrent, moderate: Secondary | ICD-10-CM

## 2019-10-26 DIAGNOSIS — G4700 Insomnia, unspecified: Secondary | ICD-10-CM | POA: Diagnosis not present

## 2019-10-26 DIAGNOSIS — F411 Generalized anxiety disorder: Secondary | ICD-10-CM

## 2019-10-26 MED ORDER — CLONAZEPAM 1 MG PO TABS: 1.0000 mg | ORAL_TABLET | Freq: Three times a day (TID) | ORAL | 2 refills | Status: DC | PRN

## 2019-10-26 MED ORDER — FLUOXETINE HCL 40 MG PO CAPS: 40.0000 mg | ORAL_CAPSULE | Freq: Every day | ORAL | 5 refills | Status: DC
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
## 2019-10-27 ENCOUNTER — Telehealth: Payer: Self-pay

## 2019-10-27 NOTE — Telephone Encounter (Signed)
-----   Message from [name], DO sent at 10/27/2019  1:39 PM EST ----- Please inform patient that nerve testing is stable and unchanged from 2018.  Specifically, still no findings to suggest neuropathy.  There is nerve injury seen in the legs which is stemming from the back, which may contribute to some of her symptoms. Continue Lyrica as she is taking.  If symptoms get worse, then we can consider seeking a second opinion as I discussed the last time. Thanks.

## 2019-10-27 NOTE — Telephone Encounter (Signed)
Pt informed of EMG results. She knows to continue with Lyrica and to call if symptoms worsen or if she would like to move forward with 2nd opinion.

## 2019-10-29 DIAGNOSIS — M1711 Unilateral primary osteoarthritis, right knee: Secondary | ICD-10-CM | POA: Diagnosis not present

## 2019-10-29 DIAGNOSIS — Z79891 Long term (current) use of opiate analgesic: Secondary | ICD-10-CM | POA: Diagnosis not present

## 2019-10-29 DIAGNOSIS — G894 Chronic pain syndrome: Secondary | ICD-10-CM | POA: Diagnosis not present

## 2019-10-29 DIAGNOSIS — G8929 Other chronic pain: Secondary | ICD-10-CM | POA: Diagnosis not present

## 2019-10-29 DIAGNOSIS — M47817 Spondylosis without myelopathy or radiculopathy, lumbosacral region: Secondary | ICD-10-CM | POA: Diagnosis not present

## 2019-10-29 DIAGNOSIS — Z79899 Other long term (current) drug therapy: Secondary | ICD-10-CM | POA: Diagnosis not present

## 2019-10-29 DIAGNOSIS — M5136 Other intervertebral disc degeneration, lumbar region: Secondary | ICD-10-CM | POA: Diagnosis not present

## 2019-11-03 DIAGNOSIS — L639 Alopecia areata, unspecified: Secondary | ICD-10-CM | POA: Diagnosis not present

## 2019-11-03 DIAGNOSIS — L83 Acanthosis nigricans: Secondary | ICD-10-CM | POA: Diagnosis not present

## 2019-11-04 DIAGNOSIS — D225 Melanocytic nevi of trunk: Secondary | ICD-10-CM | POA: Diagnosis not present

## 2019-11-04 DIAGNOSIS — D1801 Hemangioma of skin and subcutaneous tissue: Secondary | ICD-10-CM | POA: Diagnosis not present

## 2019-11-04 DIAGNOSIS — L821 Other seborrheic keratosis: Secondary | ICD-10-CM | POA: Diagnosis not present

## 2019-11-04 DIAGNOSIS — L304 Erythema intertrigo: Secondary | ICD-10-CM | POA: Diagnosis not present

## 2019-11-04 DIAGNOSIS — L820 Inflamed seborrheic keratosis: Secondary | ICD-10-CM | POA: Diagnosis not present

## 2019-11-04 DIAGNOSIS — L814 Other melanin hyperpigmentation: Secondary | ICD-10-CM | POA: Diagnosis not present

## 2019-11-05 DIAGNOSIS — M5136 Other intervertebral disc degeneration, lumbar region: Secondary | ICD-10-CM | POA: Diagnosis not present

## 2019-11-05 DIAGNOSIS — M9903 Segmental and somatic dysfunction of lumbar region: Secondary | ICD-10-CM | POA: Diagnosis not present

## 2019-11-05 DIAGNOSIS — M9902 Segmental and somatic dysfunction of thoracic region: Secondary | ICD-10-CM | POA: Diagnosis not present

## 2019-11-05 DIAGNOSIS — M9904 Segmental and somatic dysfunction of sacral region: Secondary | ICD-10-CM | POA: Diagnosis not present

## 2019-11-05 DIAGNOSIS — S338XXA Sprain of other parts of lumbar spine and pelvis, initial encounter: Secondary | ICD-10-CM | POA: Diagnosis not present

## 2019-11-05 DIAGNOSIS — S29012A Strain of muscle and tendon of back wall of thorax, initial encounter: Secondary | ICD-10-CM | POA: Diagnosis not present

## 2019-11-05 DIAGNOSIS — M47812 Spondylosis without myelopathy or radiculopathy, cervical region: Secondary | ICD-10-CM | POA: Diagnosis not present

## 2019-11-05 DIAGNOSIS — M9901 Segmental and somatic dysfunction of cervical region: Secondary | ICD-10-CM | POA: Diagnosis not present

## 2019-11-05 DIAGNOSIS — M4802 Spinal stenosis, cervical region: Secondary | ICD-10-CM | POA: Diagnosis not present

## 2019-11-19 ENCOUNTER — Other Ambulatory Visit: Payer: Self-pay | Admitting: Orthopedic Surgery

## 2019-11-20 DIAGNOSIS — Z96651 Presence of right artificial knee joint: Secondary | ICD-10-CM | POA: Diagnosis not present

## 2019-11-20 DIAGNOSIS — M25561 Pain in right knee: Secondary | ICD-10-CM | POA: Diagnosis not present

## 2019-11-23 ENCOUNTER — Ambulatory Visit (INDEPENDENT_AMBULATORY_CARE_PROVIDER_SITE_OTHER): Payer: Medicare Other | Admitting: Adult Health

## 2019-11-23 ENCOUNTER — Other Ambulatory Visit: Payer: Self-pay

## 2019-11-23 ENCOUNTER — Encounter: Payer: Self-pay | Admitting: Adult Health

## 2019-11-23 DIAGNOSIS — G4700 Insomnia, unspecified: Secondary | ICD-10-CM

## 2019-11-23 DIAGNOSIS — G4730 Sleep apnea, unspecified: Secondary | ICD-10-CM | POA: Diagnosis not present

## 2019-11-23 DIAGNOSIS — F411 Generalized anxiety disorder: Secondary | ICD-10-CM

## 2019-11-23 DIAGNOSIS — F331 Major depressive disorder, recurrent, moderate: Secondary | ICD-10-CM
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
## 2019-11-24 ENCOUNTER — Other Ambulatory Visit: Payer: Self-pay

## 2019-11-24 ENCOUNTER — Encounter (HOSPITAL_COMMUNITY)
Admission: RE | Admit: 2019-11-24 | Discharge: 2019-11-24 | Disposition: A | Discharge status: Home / Self Care | Payer: Medicare Other | Source: Ambulatory Visit | Attending: Orthopedic Surgery | Admitting: Orthopedic Surgery

## 2019-11-24 ENCOUNTER — Encounter (HOSPITAL_COMMUNITY): Payer: Self-pay

## 2019-11-24 DIAGNOSIS — G4733 Obstructive sleep apnea (adult) (pediatric): Secondary | ICD-10-CM | POA: Diagnosis not present

## 2019-11-24 DIAGNOSIS — Z01818 Encounter for other preprocedural examination: Secondary | ICD-10-CM | POA: Diagnosis not present

## 2019-11-24 DIAGNOSIS — E039 Hypothyroidism, unspecified: Secondary | ICD-10-CM | POA: Insufficient documentation

## 2019-11-24 DIAGNOSIS — I10 Essential (primary) hypertension: Secondary | ICD-10-CM | POA: Diagnosis not present

## 2019-11-24 DIAGNOSIS — Z01810 Encounter for preprocedural cardiovascular examination: Secondary | ICD-10-CM | POA: Insufficient documentation

## 2019-11-24 DIAGNOSIS — Z01812 Encounter for preprocedural laboratory examination: Secondary | ICD-10-CM | POA: Insufficient documentation

## 2019-11-24 LAB — CBC WITH DIFFERENTIAL/PLATELET
Abs Immature Granulocytes: 0.01 10*3/uL (ref 0.00–0.07)
Basophils Absolute: 0 10*3/uL (ref 0.0–0.1)
Basophils Relative: 0 %
Eosinophils Absolute: 0.2 10*3/uL (ref 0.0–0.5)
Eosinophils Relative: 3 %
HCT: 37.5 % (ref 36.0–46.0)
Hemoglobin: 12.6 g/dL (ref 12.0–15.0)
Immature Granulocytes: 0 %
Lymphocytes Relative: 24 %
Lymphs Abs: 1.9 10*3/uL (ref 0.7–4.0)
MCH: 30.5 pg (ref 26.0–34.0)
MCHC: 33.6 g/dL (ref 30.0–36.0)
MCV: 90.8 fL (ref 80.0–100.0)
Monocytes Absolute: 0.6 10*3/uL (ref 0.1–1.0)
Monocytes Relative: 7 %
Neutro Abs: 5.1 10*3/uL (ref 1.7–7.7)
Neutrophils Relative %: 66 %
Platelets: 306 10*3/uL (ref 150–400)
RBC: 4.13 MIL/uL (ref 3.87–5.11)
RDW: 15.9 % — ABNORMAL HIGH (ref 11.5–15.5)
WBC: 7.8 10*3/uL (ref 4.0–10.5)
nRBC: 0 % (ref 0.0–0.2)

## 2019-11-24 LAB — COMPREHENSIVE METABOLIC PANEL
ALT: 18 U/L (ref 0–44)
AST: 17 U/L (ref 15–41)
Albumin: 4 g/dL (ref 3.5–5.0)
Alkaline Phosphatase: 82 U/L (ref 38–126)
Anion gap: 9 (ref 5–15)
BUN: 15 mg/dL (ref 8–23)
CO2: 26 mmol/L (ref 22–32)
Calcium: 9.4 mg/dL (ref 8.9–10.3)
Chloride: 100 mmol/L (ref 98–111)
Creatinine, Ser: 0.72 mg/dL (ref 0.44–1.00)
GFR calc Af Amer: >60 mL/min (ref >60)
GFR calc non Af Amer: >60 mL/min (ref >60)
Glucose, Bld: 102 mg/dL — ABNORMAL HIGH (ref 70–99)
Potassium: 4.5 mmol/L (ref 3.5–5.1)
Sodium: 135 mmol/L (ref 135–145)
Total Bilirubin: 0.4 mg/dL (ref 0.3–1.2)
Total Protein: 7.4 g/dL (ref 6.5–8.1)

## 2019-11-24 LAB — SURGICAL PCR SCREEN
MRSA, PCR: NEGATIVE
Staphylococcus aureus: NEGATIVE

## 2019-11-24 NOTE — Progress Notes (Signed)
PCP - Dr. [name] Cardiologist -saw one before first knee surgery in 2019 but doesn't remember who   Chest x-ray - n/a EKG - 11/24/2019  epic Stress Test - 2013 ECHO - 12/21/2011 epic Cardiac Cath - n/a  Sleep Study - [facility] -Dr. Maxwell Caul CPAP - yes  Fasting Blood Sugar - n/a Checks Blood Sugar __0___ times a day  Blood Thinner Instructions:n/a Aspirin Instructions:n/a Last Dose:n/a  Anesthesia review:   Patient has a history of HTN, GERD, Hypothyroidism and OSA and uses CPAP.  Patient denies shortness of breath, fever, cough and chest pain at PAT appointment   Patient verbalized understanding of instructions that were given to them at the PAT appointment. Patient was also instructed that they will need to review over the PAT instructions again at home before surgery.

## 2019-11-24 NOTE — Patient Instructions (Addendum)
DUE TO COVID-19 ONLY ONE VISITOR IS ALLOWED TO COME WITH YOU AND STAY IN THE WAITING ROOM ONLY DURING PRE OP AND PROCEDURE DAY OF SURGERY. THE 1 VISITOR MAY VISIT WITH YOU AFTER SURGERY IN YOUR PRIVATE ROOM DURING VISITING HOURS ONLY!  YOU NEED TO HAVE A COVID 19 TEST ON_Thursday 02/25/2021______ @___2 :45 pm____, THIS TEST MUST BE DONE BEFORE SURGERY, COME  [facility] [place] , [id number].  ([place]) ONCE YOUR COVID TEST IS COMPLETED, PLEASE BEGIN THE QUARANTINE INSTRUCTIONS AS OUTLINED IN YOUR HANDOUT.                Olivia [name]     Your procedure is scheduled on: Monday 11/30/2019   Report to [facility] Main  Entrance    Report to Short Stay at  0530 AM               Please bring CPAP mask and tubing  with you to the hospital the morning of surgery!   Call this number if you have problems the morning of surgery [phone]    Remember: Do not eat food  :After Midnight.    NO SOLID FOOD AFTER MIDNIGHT THE NIGHT PRIOR TO SURGERY and  NOTHING BY MOUTH EXCEPT CLEAR LIQUIDS UNTIL   0500 am .     PLEASE FINISH ENSURE DRINK PER SURGEON ORDER  WHICH NEEDS TO BE COMPLETED AT  0500 am .   CLEAR LIQUID DIET   Foods Allowed                                                                     Foods Excluded  Coffee and tea, regular and decaf                             liquids that you cannot  Plain Jell-O any favor except red or purple                                           see through such as: Fruit ices (not with fruit pulp)                                     milk, soups, orange juice  Iced Popsicles                                    All solid food Carbonated beverages, regular and diet                                    Cranberry, grape and apple juices Sports drinks like Gatorade Lightly seasoned clear broth or consume(fat free) Sugar, honey syrup  Sample Menu Breakfast  Lunch                                      Supper Cranberry juice                    Beef broth                            Chicken broth Jell-O                                     Grape juice                           Apple juice Coffee or tea                        Jell-O                                      Popsicle                                                Coffee or tea                        Coffee or tea  _____________________________________________________________________      BRUSH YOUR TEETH MORNING OF SURGERY AND RINSE YOUR MOUTH OUT, NO CHEWING GUM CANDY OR MINTS.     Take these medicines the morning of surgery with A SIP OF WATER: Pregabalin (Lyrica), Synthroid, Esomeprazole (Nexium), Clonazepam (Klonopin) if needed for anxiety                                You may not have any metal on your body including hair pins and              piercings  Do not wear jewelry, make-up, lotions, powders or perfumes, deodorant             Do not wear nail polish on your fingernails.  Do not shave  48 hours prior to surgery.              Do not bring valuables to the hospital. [place].  Contacts, dentures or bridgework may not be worn into surgery.  Leave suitcase in the car. After surgery it may be brought to your room.                  Please read over the following fact sheets you were given: _____________________________________________________________________             [facility] - Preparing for Surgery Before surgery, you can play an important role.  Because skin is not sterile, your skin needs to be as free of germs as possible.  You can reduce the number of germs on your skin by washing  with CHG (chlorahexidine gluconate) soap before surgery.  CHG is an antiseptic cleaner which kills germs and bonds with the skin to continue killing germs even after washing. Please DO NOT use if you have an allergy to CHG or antibacterial soaps.  If your skin becomes  reddened/irritated stop using the CHG and inform your nurse when you arrive at Short Stay. Do not shave (including legs and underarms) for at least 48 hours prior to the first CHG shower.  You may shave your face/neck. Please follow these instructions carefully:  1.  Shower with CHG Soap the night before surgery and the  morning of Surgery.  2.  If you choose to wash your hair, wash your hair first as usual with your  normal  shampoo.  3.  After you shampoo, rinse your hair and body thoroughly to remove the  shampoo.                           4.  Use CHG as you would any other liquid soap.  You can apply chg directly  to the skin and wash                       Gently with a scrungie or clean washcloth.  5.  Apply the CHG Soap to your body ONLY FROM THE NECK DOWN.   Do not use on face/ open                           Wound or open sores. Avoid contact with eyes, ears mouth and genitals (private parts).                       Wash face,  Genitals (private parts) with your normal soap.             6.  Wash thoroughly, paying special attention to the area where your surgery  will be performed.  7.  Thoroughly rinse your body with warm water from the neck down.  8.  DO NOT shower/wash with your normal soap after using and rinsing off  the CHG Soap.                9.  Pat yourself dry with a clean towel.            10.  Wear clean pajamas.            11.  Place clean sheets on your bed the night of your first shower and do not  sleep with pets. Day of Surgery : Do not apply any lotions/deodorants the morning of surgery.  Please wear clean clothes to the hospital/surgery center.  FAILURE TO FOLLOW THESE INSTRUCTIONS MAY RESULT IN THE CANCELLATION OF YOUR SURGERY PATIENT SIGNATURE_________________________________  NURSE SIGNATURE__________________________________  ________________________________________________________________________   Olivia [name]  An incentive spirometer is a tool that  can help keep your lungs clear and active. This tool measures how well you are filling your lungs with each breath. Taking long deep breaths may help reverse or decrease the chance of developing breathing (pulmonary) problems (especially infection) following:  A long period of time when you are unable to move or be active. BEFORE THE PROCEDURE   If the spirometer includes an indicator to show your best effort, your nurse or respiratory therapist will set it to a desired goal.  If possible, sit up straight  or lean slightly forward. Try not to slouch.  Hold the incentive spirometer in an upright position. INSTRUCTIONS FOR USE  1. Sit on the edge of your bed if possible, or sit up as far as you can in bed or on a chair. 2. Hold the incentive spirometer in an upright position. 3. Breathe out normally. 4. Place the mouthpiece in your mouth and seal your lips tightly around it. 5. Breathe in slowly and as deeply as possible, raising the piston or the ball toward the top of the column. 6. Hold your breath for 3-5 seconds or for as long as possible. Allow the piston or ball to fall to the bottom of the column. 7. Remove the mouthpiece from your mouth and breathe out normally. 8. Rest for a few seconds and repeat Steps 1 through 7 at least 10 times every 1-2 hours when you are awake. Take your time and take a few normal breaths between deep breaths. 9. The spirometer may include an indicator to show your best effort. Use the indicator as a goal to work toward during each repetition. 10. After each set of 10 deep breaths, practice coughing to be sure your lungs are clear. If you have an incision (the cut made at the time of surgery), support your incision when coughing by placing a pillow or rolled up towels firmly against it. Once you are able to get out of bed, walk around indoors and cough well. You may stop using the incentive spirometer when instructed by your caregiver.  RISKS AND  COMPLICATIONS  Take your time so you do not get dizzy or light-headed.  If you are in pain, you may need to take or ask for pain medication before doing incentive spirometry. It is harder to take a deep breath if you are having pain. AFTER USE  Rest and breathe slowly and easily.  It can be helpful to keep track of a log of your progress. Your caregiver can provide you with a simple table to help with this. If you are using the spirometer at home, follow these instructions: [place] IF:   You are having difficultly using the spirometer.  You have trouble using the spirometer as often as instructed.  Your pain medication is not giving enough relief while using the spirometer.  You develop fever of 100.5 F (38.1 C) or higher. SEEK IMMEDIATE MEDICAL CARE IF:   You cough up bloody sputum that had not been present before.  You develop fever of 102 F (38.9 C) or greater.  You develop worsening pain at or near the incision site. MAKE SURE YOU:   Understand these instructions.  Will watch your condition.  Will get help right away if you are not doing well or get worse. Document Released: 01/28/2007 Document Revised: 12/10/2011 Document Reviewed: 03/31/2007 [facility] Patient Information 2014 [place], [place].   ________________________________________________________________________

## 2019-11-26 ENCOUNTER — Other Ambulatory Visit (HOSPITAL_COMMUNITY)
Admission: RE | Admit: 2019-11-26 | Discharge: 2019-11-26 | Disposition: A | Discharge status: Home / Self Care | Payer: Medicare Other | Source: Ambulatory Visit | Attending: Orthopedic Surgery | Admitting: Orthopedic Surgery

## 2019-11-26 DIAGNOSIS — Z20822 Contact with and (suspected) exposure to covid-19: Secondary | ICD-10-CM | POA: Insufficient documentation

## 2019-11-26 DIAGNOSIS — Z01812 Encounter for preprocedural laboratory examination: Secondary | ICD-10-CM | POA: Insufficient documentation

## 2019-11-26 LAB — SARS CORONAVIRUS 2 (TAT 6-24 HRS): SARS Coronavirus 2: NEGATIVE

## 2019-11-27 NOTE — Anesthesia Preprocedure Evaluation (Addendum)
Anesthesia Evaluation  Patient identified by MRN, date of birth, ID band Patient awake    Reviewed: Allergy & Precautions, NPO status , Patient's Chart, lab work & pertinent test results, reviewed documented beta blocker date and time   History of Anesthesia Complications Negative for: history of anesthetic complications  Airway Mallampati: II  TM Distance: >3 FB Neck ROM: Full    Dental no notable dental hx.    Pulmonary sleep apnea ,    Pulmonary exam normal        Cardiovascular hypertension, Pt. on medications and Pt. on home beta blockers Normal cardiovascular exam     Neuro/Psych Anxiety Depression negative neurological ROS     GI/Hepatic Neg liver ROS, GERD  Medicated and Controlled,  Endo/Other  Hypothyroidism BMI 38  Renal/GU negative Renal ROS  negative genitourinary   Musculoskeletal  (+) Arthritis ,   Abdominal   Peds  Hematology negative hematology ROS (+)   Anesthesia Other Findings Day of surgery medications reviewed with patient.  Reproductive/Obstetrics negative OB ROS                            Anesthesia Physical Anesthesia Plan  ASA: II  Anesthesia Plan: Spinal   Post-op Pain Management:  [facility] for Post-op pain   Induction:   PONV Risk Score and Plan: Treatment may vary due to age or medical condition, Ondansetron, Propofol infusion and Dexamethasone  Airway Management Planned: Natural Airway and Simple Face Mask  Additional Equipment: None  Intra-op Plan:   Post-operative Plan:   Informed Consent: I have reviewed the patients History and Physical, chart, labs and discussed the procedure including the risks, benefits and alternatives for the proposed anesthesia with the patient or authorized representative who has indicated his/her understanding and acceptance.     Dental advisory given  Plan Discussed with: CRNA  Anesthesia Plan Comments:         Anesthesia Quick Evaluation

## 2019-11-30 ENCOUNTER — Encounter (HOSPITAL_COMMUNITY): Payer: Self-pay | Admitting: Orthopedic Surgery

## 2019-11-30 ENCOUNTER — Observation Stay (HOSPITAL_COMMUNITY)
Admission: RE | Admit: 2019-11-30 | Discharge: 2019-12-01 | Disposition: A | Discharge status: Home / Self Care | Payer: Medicare Other | Source: Ambulatory Visit | Attending: Orthopedic Surgery | Admitting: Orthopedic Surgery

## 2019-11-30 ENCOUNTER — Inpatient Hospital Stay (HOSPITAL_COMMUNITY): Payer: Medicare Other | Admitting: Physician Assistant

## 2019-11-30 ENCOUNTER — Encounter (HOSPITAL_COMMUNITY)
Admission: RE | Disposition: A | Discharge status: Home / Self Care | Payer: Self-pay | Source: Ambulatory Visit | Attending: Orthopedic Surgery

## 2019-11-30 ENCOUNTER — Other Ambulatory Visit: Payer: Self-pay

## 2019-11-30 ENCOUNTER — Inpatient Hospital Stay (HOSPITAL_COMMUNITY): Payer: Medicare Other | Admitting: Certified Registered Nurse Anesthetist

## 2019-11-30 DIAGNOSIS — I10 Essential (primary) hypertension: Secondary | ICD-10-CM | POA: Insufficient documentation

## 2019-11-30 DIAGNOSIS — E039 Hypothyroidism, unspecified: Secondary | ICD-10-CM | POA: Insufficient documentation

## 2019-11-30 DIAGNOSIS — Z79890 Hormone replacement therapy (postmenopausal): Secondary | ICD-10-CM | POA: Insufficient documentation

## 2019-11-30 DIAGNOSIS — T8484XA Pain due to internal orthopedic prosthetic devices, implants and grafts, initial encounter: Secondary | ICD-10-CM | POA: Insufficient documentation

## 2019-11-30 DIAGNOSIS — F419 Anxiety disorder, unspecified: Secondary | ICD-10-CM | POA: Insufficient documentation

## 2019-11-30 DIAGNOSIS — Y831 Surgical operation with implant of artificial internal device as the cause of abnormal reaction of the patient, or of later complication, without mention of misadventure at the time of the procedure: Secondary | ICD-10-CM | POA: Insufficient documentation

## 2019-11-30 DIAGNOSIS — Z79899 Other long term (current) drug therapy: Secondary | ICD-10-CM | POA: Insufficient documentation

## 2019-11-30 DIAGNOSIS — T84012A Broken internal right knee prosthesis, initial encounter: Secondary | ICD-10-CM | POA: Diagnosis not present

## 2019-11-30 DIAGNOSIS — T84092A Other mechanical complication of internal right knee prosthesis, initial encounter: Secondary | ICD-10-CM | POA: Diagnosis not present

## 2019-11-30 DIAGNOSIS — G4733 Obstructive sleep apnea (adult) (pediatric): Secondary | ICD-10-CM | POA: Diagnosis not present

## 2019-11-30 DIAGNOSIS — G8918 Other acute postprocedural pain: Secondary | ICD-10-CM | POA: Diagnosis not present

## 2019-11-30 DIAGNOSIS — Z96659 Presence of unspecified artificial knee joint: Secondary | ICD-10-CM

## 2019-11-30 DIAGNOSIS — F329 Major depressive disorder, single episode, unspecified: Secondary | ICD-10-CM | POA: Insufficient documentation

## 2019-11-30 DIAGNOSIS — K219 Gastro-esophageal reflux disease without esophagitis: Secondary | ICD-10-CM | POA: Diagnosis not present

## 2019-11-30 DIAGNOSIS — M25861 Other specified joint disorders, right knee: Secondary | ICD-10-CM | POA: Diagnosis not present

## 2019-11-30 DIAGNOSIS — E785 Hyperlipidemia, unspecified: Secondary | ICD-10-CM | POA: Diagnosis not present

## 2019-11-30 DIAGNOSIS — Z96651 Presence of right artificial knee joint: Secondary | ICD-10-CM | POA: Diagnosis not present

## 2019-11-30 HISTORY — PX: SYNOVECTOMY WITH POLY EXCHANGE: SHX6746

## 2019-11-30 SURGERY — SYNOVECTOMY WITH POLY EXCHANGE
Anesthesia: Spinal | Site: Knee | Laterality: Right

## 2019-11-30 MED ORDER — BISACODYL 5 MG PO TBEC: 5.0000 mg | DELAYED_RELEASE_TABLET | Freq: Every day | ORAL | Status: DC | PRN

## 2019-11-30 MED ORDER — LIDOCAINE HCL (CARDIAC) PF 100 MG/5ML IV SOSY
PREFILLED_SYRINGE | INTRAVENOUS | Status: DC | PRN
  Administered 2019-11-30: 100 mg via INTRATRACHEAL

## 2019-11-30 MED ORDER — PANTOPRAZOLE SODIUM 40 MG PO TBEC: 40.0000 mg | DELAYED_RELEASE_TABLET | Freq: Every day | ORAL | Status: DC

## 2019-11-30 MED ORDER — OXYCODONE HCL 5 MG/5ML PO SOLN: 5.0000 mg | Freq: Once | ORAL | Status: DC | PRN

## 2019-11-30 MED ORDER — ASPIRIN EC 325 MG PO TBEC
325.0000 mg | DELAYED_RELEASE_TABLET | Freq: Two times a day (BID) | ORAL | Status: DC
  Administered 2019-12-01: 325 mg via ORAL
  Filled 2019-11-30: qty 1

## 2019-11-30 MED ORDER — CLINDAMYCIN PHOSPHATE 900 MG/50ML IV SOLN
900.0000 mg | INTRAVENOUS | Status: AC
  Administered 2019-11-30: 900 mg via INTRAVENOUS
  Filled 2019-11-30: qty 50

## 2019-11-30 MED ORDER — SODIUM CHLORIDE 0.9 % IV SOLN: INTRAVENOUS | Status: DC

## 2019-11-30 MED ORDER — DEXAMETHASONE SODIUM PHOSPHATE 10 MG/ML IJ SOLN: 8.0000 mg | Freq: Once | INTRAMUSCULAR | Status: DC

## 2019-11-30 MED ORDER — GABAPENTIN 300 MG PO CAPS
300.0000 mg | ORAL_CAPSULE | Freq: Once | ORAL | Status: AC
  Administered 2019-11-30: 300 mg via ORAL
  Filled 2019-11-30: qty 1

## 2019-11-30 MED ORDER — PROPOFOL 10 MG/ML IV BOLUS
INTRAVENOUS | Status: DC | PRN
  Administered 2019-11-30: 180 mg via INTRAVENOUS
  Administered 2019-11-30: 20 mg via INTRAVENOUS

## 2019-11-30 MED ORDER — FENTANYL CITRATE (PF) 100 MCG/2ML IJ SOLN
INTRAMUSCULAR | Status: AC
  Filled 2019-11-30: qty 2

## 2019-11-30 MED ORDER — MENTHOL 3 MG MT LOZG: 1.0000 | LOZENGE | OROMUCOSAL | Status: DC | PRN

## 2019-11-30 MED ORDER — PANTOPRAZOLE SODIUM 40 MG PO TBEC
40.0000 mg | DELAYED_RELEASE_TABLET | Freq: Every day | ORAL | Status: DC
  Administered 2019-12-01: 40 mg via ORAL
  Filled 2019-11-30: qty 1

## 2019-11-30 MED ORDER — ONDANSETRON HCL 4 MG PO TABS: 4.0000 mg | ORAL_TABLET | Freq: Four times a day (QID) | ORAL | Status: DC | PRN

## 2019-11-30 MED ORDER — BUPIVACAINE IN DEXTROSE 0.75-8.25 % IT SOLN
INTRATHECAL | Status: DC | PRN
  Administered 2019-11-30: 1.6 mL via INTRATHECAL

## 2019-11-30 MED ORDER — ONDANSETRON HCL 4 MG/2ML IJ SOLN
4.0000 mg | Freq: Four times a day (QID) | INTRAMUSCULAR | Status: DC | PRN
  Administered 2019-11-30 – 2019-12-01 (×2): 4 mg via INTRAVENOUS
  Filled 2019-11-30 (×2): qty 2

## 2019-11-30 MED ORDER — OXYCODONE HCL 5 MG PO TABS
5.0000 mg | ORAL_TABLET | ORAL | Status: DC | PRN
  Administered 2019-11-30 – 2019-12-01 (×7): 10 mg via ORAL
  Filled 2019-11-30 (×7): qty 2

## 2019-11-30 MED ORDER — CLONAZEPAM 1 MG PO TABS
1.0000 mg | ORAL_TABLET | Freq: Three times a day (TID) | ORAL | Status: DC | PRN
  Administered 2019-12-01: 1 mg via ORAL
  Filled 2019-11-30: qty 1

## 2019-11-30 MED ORDER — BUPIVACAINE HCL (PF) 0.25 % IJ SOLN
INTRAMUSCULAR | Status: AC
  Filled 2019-11-30: qty 30

## 2019-11-30 MED ORDER — METHOCARBAMOL 500 MG IVPB - SIMPLE MED
500.0000 mg | Freq: Four times a day (QID) | INTRAVENOUS | Status: DC | PRN
  Administered 2019-11-30: 500 mg via INTRAVENOUS
  Filled 2019-11-30: qty 50

## 2019-11-30 MED ORDER — PHENYLEPHRINE HCL (PRESSORS) 10 MG/ML IV SOLN
INTRAVENOUS | Status: AC
  Filled 2019-11-30: qty 1

## 2019-11-30 MED ORDER — ACETAMINOPHEN 500 MG PO TABS
1000.0000 mg | ORAL_TABLET | Freq: Four times a day (QID) | ORAL | Status: AC
  Administered 2019-11-30 (×3): 1000 mg via ORAL
  Filled 2019-11-30 (×5): qty 2

## 2019-11-30 MED ORDER — POVIDONE-IODINE 10 % EX SWAB
2.0000 "application " | Freq: Once | CUTANEOUS | Status: AC
  Administered 2019-11-30: 2 via TOPICAL

## 2019-11-30 MED ORDER — BUPIVACAINE-EPINEPHRINE (PF) 0.5% -1:200000 IJ SOLN
INTRAMUSCULAR | Status: DC | PRN
  Administered 2019-11-30: 15 mL via PERINEURAL

## 2019-11-30 MED ORDER — METHOCARBAMOL 500 MG IVPB - SIMPLE MED
INTRAVENOUS | Status: AC
  Filled 2019-11-30: qty 50

## 2019-11-30 MED ORDER — EPHEDRINE SULFATE-NACL 50-0.9 MG/10ML-% IV SOSY
PREFILLED_SYRINGE | INTRAVENOUS | Status: DC | PRN
  Administered 2019-11-30 (×2): 10 mg via INTRAVENOUS

## 2019-11-30 MED ORDER — TRANEXAMIC ACID-NACL 1000-0.7 MG/100ML-% IV SOLN
1000.0000 mg | INTRAVENOUS | Status: AC
  Administered 2019-11-30: 1000 mg via INTRAVENOUS
  Filled 2019-11-30: qty 100

## 2019-11-30 MED ORDER — MIDAZOLAM HCL 2 MG/2ML IJ SOLN
1.0000 mg | Freq: Once | INTRAMUSCULAR | Status: AC
  Administered 2019-11-30: 1 mg via INTRAVENOUS
  Filled 2019-11-30: qty 2

## 2019-11-30 MED ORDER — FERROUS SULFATE 325 (65 FE) MG PO TABS
325.0000 mg | ORAL_TABLET | Freq: Three times a day (TID) | ORAL | Status: DC
  Administered 2019-12-01 (×2): 325 mg via ORAL
  Filled 2019-11-30 (×2): qty 1

## 2019-11-30 MED ORDER — FLEET ENEMA 7-19 GM/118ML RE ENEM: 1.0000 | ENEMA | Freq: Once | RECTAL | Status: DC | PRN

## 2019-11-30 MED ORDER — OXYCODONE HCL 5 MG PO TABS: 5.0000 mg | ORAL_TABLET | Freq: Once | ORAL | Status: DC | PRN

## 2019-11-30 MED ORDER — METHOCARBAMOL 500 MG PO TABS
500.0000 mg | ORAL_TABLET | Freq: Four times a day (QID) | ORAL | Status: DC | PRN
  Administered 2019-12-01: 500 mg via ORAL
  Filled 2019-11-30: qty 1

## 2019-11-30 MED ORDER — ALUM & MAG HYDROXIDE-SIMETH 200-200-20 MG/5ML PO SUSP: 30.0000 mL | ORAL | Status: DC | PRN

## 2019-11-30 MED ORDER — CELECOXIB 200 MG PO CAPS
400.0000 mg | ORAL_CAPSULE | Freq: Once | ORAL | Status: AC
  Administered 2019-11-30: 400 mg via ORAL
  Filled 2019-11-30: qty 2

## 2019-11-30 MED ORDER — FENTANYL CITRATE (PF) 100 MCG/2ML IJ SOLN
25.0000 ug | INTRAMUSCULAR | Status: DC | PRN
  Administered 2019-11-30 (×2): 50 ug via INTRAVENOUS

## 2019-11-30 MED ORDER — ACETAMINOPHEN 500 MG PO TABS
1000.0000 mg | ORAL_TABLET | Freq: Once | ORAL | Status: AC
  Administered 2019-11-30: 1000 mg via ORAL
  Filled 2019-11-30: qty 2

## 2019-11-30 MED ORDER — PROPOFOL 1000 MG/100ML IV EMUL
INTRAVENOUS | Status: AC
  Filled 2019-11-30: qty 100

## 2019-11-30 MED ORDER — LACTATED RINGERS IV SOLN: INTRAVENOUS | Status: DC

## 2019-11-30 MED ORDER — FENTANYL CITRATE (PF) 100 MCG/2ML IJ SOLN
50.0000 ug | Freq: Once | INTRAMUSCULAR | Status: AC
  Administered 2019-11-30: 100 ug via INTRAVENOUS
  Filled 2019-11-30: qty 2

## 2019-11-30 MED ORDER — SODIUM CHLORIDE 0.9 % IR SOLN
Status: DC | PRN
  Administered 2019-11-30: 3000 mL

## 2019-11-30 MED ORDER — SODIUM CHLORIDE 0.9 % IV SOLN
INTRAVENOUS | Status: DC | PRN
  Administered 2019-11-30: 40 mL

## 2019-11-30 MED ORDER — LISINOPRIL 20 MG PO TABS
20.0000 mg | ORAL_TABLET | Freq: Every day | ORAL | Status: DC
  Administered 2019-11-30: 20 mg via ORAL
  Filled 2019-11-30 (×2): qty 1

## 2019-11-30 MED ORDER — DIPHENHYDRAMINE HCL 12.5 MG/5ML PO ELIX: 12.5000 mg | ORAL_SOLUTION | ORAL | Status: DC | PRN

## 2019-11-30 MED ORDER — CLINDAMYCIN PHOSPHATE 600 MG/50ML IV SOLN
600.0000 mg | Freq: Four times a day (QID) | INTRAVENOUS | Status: AC
  Administered 2019-11-30 (×2): 600 mg via INTRAVENOUS
  Filled 2019-11-30 (×2): qty 50

## 2019-11-30 MED ORDER — ONDANSETRON HCL 4 MG/2ML IJ SOLN
INTRAMUSCULAR | Status: AC
  Filled 2019-11-30: qty 2

## 2019-11-30 MED ORDER — METOCLOPRAMIDE HCL 5 MG/ML IJ SOLN
5.0000 mg | Freq: Three times a day (TID) | INTRAMUSCULAR | Status: DC | PRN
  Administered 2019-11-30 – 2019-12-01 (×2): 10 mg via INTRAVENOUS
  Filled 2019-11-30 (×2): qty 2

## 2019-11-30 MED ORDER — PROMETHAZINE HCL 25 MG/ML IJ SOLN: 6.2500 mg | INTRAMUSCULAR | Status: DC | PRN

## 2019-11-30 MED ORDER — GABAPENTIN 300 MG PO CAPS: 300.0000 mg | ORAL_CAPSULE | Freq: Three times a day (TID) | ORAL | Status: DC

## 2019-11-30 MED ORDER — FLUOXETINE HCL 20 MG PO CAPS
40.0000 mg | ORAL_CAPSULE | Freq: Every day | ORAL | Status: DC
  Administered 2019-11-30: 40 mg via ORAL
  Filled 2019-11-30: qty 2

## 2019-11-30 MED ORDER — BUPIVACAINE LIPOSOME 1.3 % IJ SUSP
Freq: Once | INTRAMUSCULAR | Status: DC
  Filled 2019-11-30: qty 20

## 2019-11-30 MED ORDER — DEXAMETHASONE SODIUM PHOSPHATE 10 MG/ML IJ SOLN
10.0000 mg | Freq: Once | INTRAMUSCULAR | Status: AC
  Administered 2019-12-01: 10 mg via INTRAVENOUS
  Filled 2019-11-30: qty 1

## 2019-11-30 MED ORDER — LEVOTHYROXINE SODIUM 125 MCG PO TABS
125.0000 ug | ORAL_TABLET | Freq: Every day | ORAL | Status: DC
  Filled 2019-11-30: qty 1

## 2019-11-30 MED ORDER — CHLORHEXIDINE GLUCONATE 4 % EX LIQD: 60.0000 mL | Freq: Once | CUTANEOUS | Status: DC

## 2019-11-30 MED ORDER — ACETAMINOPHEN 500 MG PO TABS: 1000.0000 mg | ORAL_TABLET | Freq: Once | ORAL | Status: DC

## 2019-11-30 MED ORDER — SENNOSIDES-DOCUSATE SODIUM 8.6-50 MG PO TABS: 1.0000 | ORAL_TABLET | Freq: Every evening | ORAL | Status: DC | PRN

## 2019-11-30 MED ORDER — 0.9 % SODIUM CHLORIDE (POUR BTL) OPTIME
TOPICAL | Status: DC | PRN
  Administered 2019-11-30: 1000 mL

## 2019-11-30 MED ORDER — TRAMADOL HCL 50 MG PO TABS
50.0000 mg | ORAL_TABLET | Freq: Four times a day (QID) | ORAL | Status: DC
  Administered 2019-11-30 – 2019-12-01 (×4): 50 mg via ORAL
  Filled 2019-11-30 (×4): qty 1

## 2019-11-30 MED ORDER — ONDANSETRON HCL 4 MG/2ML IJ SOLN
INTRAMUSCULAR | Status: DC | PRN
  Administered 2019-11-30: 4 mg via INTRAVENOUS

## 2019-11-30 MED ORDER — PREGABALIN 100 MG PO CAPS
100.0000 mg | ORAL_CAPSULE | Freq: Two times a day (BID) | ORAL | Status: DC
  Administered 2019-11-30 – 2019-12-01 (×2): 100 mg via ORAL
  Filled 2019-11-30 (×2): qty 1

## 2019-11-30 MED ORDER — TRANEXAMIC ACID-NACL 1000-0.7 MG/100ML-% IV SOLN
1000.0000 mg | Freq: Once | INTRAVENOUS | Status: AC
  Administered 2019-11-30: 1000 mg via INTRAVENOUS
  Filled 2019-11-30: qty 100

## 2019-11-30 MED ORDER — HYDROMORPHONE HCL 1 MG/ML IJ SOLN
0.5000 mg | INTRAMUSCULAR | Status: DC | PRN
  Administered 2019-11-30: 0.5 mg via INTRAVENOUS
  Filled 2019-11-30: qty 1

## 2019-11-30 MED ORDER — METOCLOPRAMIDE HCL 5 MG PO TABS: 5.0000 mg | ORAL_TABLET | Freq: Three times a day (TID) | ORAL | Status: DC | PRN

## 2019-11-30 MED ORDER — STERILE WATER FOR IRRIGATION IR SOLN
Status: DC | PRN
  Administered 2019-11-30: 2000 mL

## 2019-11-30 MED ORDER — ZOLPIDEM TARTRATE 5 MG PO TABS: 5.0000 mg | ORAL_TABLET | Freq: Every evening | ORAL | Status: DC | PRN

## 2019-11-30 MED ORDER — DOCUSATE SODIUM 100 MG PO CAPS
100.0000 mg | ORAL_CAPSULE | Freq: Two times a day (BID) | ORAL | Status: DC
  Administered 2019-11-30 – 2019-12-01 (×2): 100 mg via ORAL
  Filled 2019-11-30 (×2): qty 1

## 2019-11-30 MED ORDER — FENTANYL CITRATE (PF) 100 MCG/2ML IJ SOLN
INTRAMUSCULAR | Status: DC | PRN
  Administered 2019-11-30 (×2): 50 ug via INTRAVENOUS

## 2019-11-30 MED ORDER — BUPIVACAINE HCL (PF) 0.25 % IJ SOLN
INTRAMUSCULAR | Status: DC | PRN
  Administered 2019-11-30: 60 mL

## 2019-11-30 MED ORDER — PHENOL 1.4 % MT LIQD: 1.0000 | OROMUCOSAL | Status: DC | PRN

## 2019-11-30 MED ORDER — ATENOLOL 25 MG PO TABS
25.0000 mg | ORAL_TABLET | Freq: Every day | ORAL | Status: DC
  Administered 2019-11-30: 25 mg via ORAL
  Filled 2019-11-30: qty 1

## 2019-11-30 MED ORDER — BUPIVACAINE LIPOSOME 1.3 % IJ SUSP
20.0000 mL | Freq: Once | INTRAMUSCULAR | Status: DC
  Filled 2019-11-30: qty 20

## 2019-11-30 SURGICAL SUPPLY — 43 items
BAG ZIPLOCK 12X15 (MISCELLANEOUS) ×2 IMPLANT
BLADE SURG SZ10 CARB STEEL (BLADE) ×8 IMPLANT
BNDG ELASTIC 6X5.8 VLCR STR LF (GAUZE/BANDAGES/DRESSINGS) ×2 IMPLANT
COVER SURGICAL LIGHT HANDLE (MISCELLANEOUS) ×2 IMPLANT
COVER WAND RF STERILE (DRAPES) ×2 IMPLANT
CUFF TOURN SGL QUICK 34 (TOURNIQUET CUFF) ×2
CUFF TRNQT CYL 34X4.125X (TOURNIQUET CUFF) ×2 IMPLANT
DECANTER SPIKE VIAL GLASS SM (MISCELLANEOUS) ×4 IMPLANT
DRAPE INCISE IOBAN 66X45 STRL (DRAPES) ×4 IMPLANT
DRAPE U-SHAPE 47X51 STRL (DRAPES) ×2 IMPLANT
DRESSING AQUACEL AG SP 3.5X10 (GAUZE/BANDAGES/DRESSINGS) ×1 IMPLANT
DRSG AQUACEL AG ADV 3.5X10 (GAUZE/BANDAGES/DRESSINGS) ×2 IMPLANT
DRSG AQUACEL AG SP 3.5X10 (GAUZE/BANDAGES/DRESSINGS) ×2
DURAPREP 26ML APPLICATOR (WOUND CARE) ×4 IMPLANT
ELECT REM PT RETURN 15FT ADLT (MISCELLANEOUS) ×2 IMPLANT
GLOVE BIOGEL PI IND STRL 8.5 (GLOVE) ×2 IMPLANT
GLOVE BIOGEL PI INDICATOR 8.5 (GLOVE) ×2
GLOVE SURG ORTHO 8.0 STRL STRW (GLOVE) ×6 IMPLANT
GOWN STRL REUS W/TWL XL LVL3 (GOWN DISPOSABLE) ×4 IMPLANT
HANDPIECE INTERPULSE COAX TIP (DISPOSABLE) ×1
HOOD PEEL AWAY FLYTE STAYCOOL (MISCELLANEOUS) ×6 IMPLANT
KIT TURNOVER KIT A (KITS) IMPLANT
MANIFOLD NEPTUNE II (INSTRUMENTS) ×2 IMPLANT
NEEDLE HYPO 22GX1.5 SAFETY (NEEDLE) ×2 IMPLANT
NS IRRIG 1000ML POUR BTL (IV SOLUTION) ×2 IMPLANT
PENCIL SMOKE EVACUATOR (MISCELLANEOUS) IMPLANT
PLATE ROT INSERT 10MM SIZE 4 (Plate) ×2 IMPLANT
PROTECTOR NERVE ULNAR (MISCELLANEOUS) ×2 IMPLANT
SET HNDPC FAN SPRY TIP SCT (DISPOSABLE) ×1 IMPLANT
STRIP CLOSURE SKIN 1/2X4 (GAUZE/BANDAGES/DRESSINGS) ×4 IMPLANT
SUT BONE WAX W31G (SUTURE) ×2 IMPLANT
SUT MNCRL AB 3-0 PS2 18 (SUTURE) ×2 IMPLANT
SUT STRATAFIX 0 PDS 27 VIOLET (SUTURE) ×2
SUT STRATAFIX PDS+ 0 24IN (SUTURE) ×2 IMPLANT
SUT VIC AB 1 CT1 36 (SUTURE) ×2 IMPLANT
SUTURE STRATFX 0 PDS 27 VIOLET (SUTURE) ×1 IMPLANT
SWAB COLLECTION DEVICE MRSA (MISCELLANEOUS) IMPLANT
SWAB CULTURE ESWAB REG 1ML (MISCELLANEOUS) IMPLANT
SYR CONTROL 10ML LL (SYRINGE) ×4 IMPLANT
TRAY FOLEY MTR SLVR 16FR STAT (SET/KITS/TRAYS/PACK) ×2 IMPLANT
WATER STERILE IRR 1000ML POUR (IV SOLUTION) ×2 IMPLANT
WRAP KNEE MAXI GEL POST OP (GAUZE/BANDAGES/DRESSINGS) ×2 IMPLANT
YANKAUER SUCT BULB TIP 10FT TU (MISCELLANEOUS) ×2 IMPLANT

## 2019-11-30 NOTE — Progress Notes (Signed)
AssistedDr. Birdie Sons with right, ultrasound guided, adductor canal block. Side rails up, monitors on throughout procedure. See vital signs in flow sheet. Tolerated Procedure well.

## 2019-11-30 NOTE — Anesthesia Procedure Notes (Addendum)
Spinal  Patient location during procedure: OR Start time: 11/30/2019 8:13 AM End time: 11/30/2019 8:17 AM Staffing Performed: anesthesiologist  Anesthesiologist: Brennan Bailey, MD Preanesthetic Checklist Completed: patient identified, IV checked, risks and benefits discussed, surgical consent, monitors and equipment checked, pre-op evaluation and timeout performed Spinal Block Patient position: sitting Prep: DuraPrep and site prepped and draped Patient monitoring: continuous pulse ox, blood pressure and heart rate Approach: midline Location: L3-4 Injection technique: single-shot Needle Needle type: Whitacre  Needle gauge: 22 G Needle length: 9 cm Additional Notes Risks, benefits, and alternative discussed. Patient gave consent to procedure. Prepped and draped in sitting position. Patient sedated but responsive to voice.Difficult spinal placement due to habitus and suspected scoliosis. Several attempts by CRNA at L3-4 unsuccessful. 2 attempts by myself at same level with Whitacre 22g required to obtain clear CSF. Brisk flow of CSF but unable to aspirate despite repositioning. Medication administered but unable to aspirate afterward as well. No pain or paraesthesias with injection. Patient tolerated procedure well. Vital signs stable. After 5 minutes, patient without motor or sensory block. Converted to [place] for case. Tawny Asal, MD

## 2019-11-30 NOTE — Evaluation (Signed)
Physical Therapy Evaluation Patient Details Name: Olivia [name] MRN: [id number] DOB: 27-Jul-1951 Today's Date: 11/30/2019   History of Present Illness  Patient is 69 y.o female s/p Rt TKR (polyexchange) on 11/30/19 to revise Rt TKA performed on 11/02/17. PMH significant for HTN, HLD, OSA, depression, anxiety.    Clinical Impression  Olivia [name] is a 69 y.o. female POD 0 s/p Rt TKR. Patient reports independence with mobility at baseline. Patient is now limited by functional impairments (see PT problem list below) and requires min assist for transfers and gait with RW. Patient was able to ambulate ~70 feet with RW and min assist. Patient instructed in exercise to facilitate ROM and circulation. Patient will benefit from continued skilled PT interventions to address impairments and progress towards PLOF. Acute PT will follow to progress mobility and stair training in preparation for safe discharge home.     Follow Up Recommendations Follow surgeon's recommendation for DC plan and follow-up therapies    Equipment Recommendations  None recommended by PT    Recommendations for Other Services       Precautions / Restrictions Precautions Precautions: Fall Restrictions Weight Bearing Restrictions: No RLE Weight Bearing: Weight bearing as tolerated      Mobility  Bed Mobility Overal bed mobility: Needs Assistance Bed Mobility: Supine to Sit     Supine to sit: HOB elevated;Min assist     General bed mobility comments: cues for use of bed rail, assist to bring Rt LE to EOB.  Transfers Overall transfer level: Needs assistance Equipment used: Rolling walker (2 wheeled) Transfers: Sit to/from Stand Sit to Stand: Mod assist         General transfer comment: cues for safe hand placement and technique with RW, assist required for power up. cues to reach back to control lowering to chair.  Ambulation/Gait Ambulation/Gait assistance: Min assist;Min guard Gait Distance (Feet): 70  Feet Assistive device: Rolling walker (2 wheeled) Gait Pattern/deviations: Step-to pattern;Decreased stride length;Decreased weight shift to right;Decreased stance time - right;Decreased step length - left Gait velocity: decreased   General Gait Details: cues for safe step pattern in RW. pt maintained safe proximity throughout, no overt LOB. min assist intermittently for navigating obstacles in hallway like chairs and negotiating doorway.  Stairs            Wheelchair Mobility    Modified Rankin (Stroke Patients Only)       Balance Overall balance assessment: Needs assistance Sitting-balance support: Feet supported Sitting balance-Leahy Scale: Good     Standing balance support: During functional activity;Bilateral upper extremity supported Standing balance-Leahy Scale: Poor          Pertinent Vitals/Pain Pain Assessment: 0-10 Pain Score: 7  Pain Location: Rt knee Pain Descriptors / Indicators: Aching;Burning;Sore Pain Intervention(s): Limited activity within patient's tolerance;Monitored during session;Ice applied    Home Living Family/patient expects to be discharged to:: Private residence Living Arrangements: Children Available Help at Discharge: Family Type of Home: House(3 level townhouse) Home Access: Stairs to enter;Level entry Entrance Stairs-Rails: Left Entrance Stairs-Number of Steps: pt enters in garage/basement level (level entry). has to go up ~14 steps with Lt hand rail to get to main level with the living room Home Layout: Multi-level;Bed/bath upstairs;1/2 bath on main level Home Equipment: Shower seat;Cane - single point;Walker - 2 wheels;Bedside commode;Toilet riser      Prior Function Level of Independence: Independent         Comments: pt on disability and teaching.     Hand Dominance  Dominant Hand: Right    Extremity/Trunk Assessment   Upper Extremity Assessment Upper Extremity Assessment: Overall WFL for tasks assessed     Lower Extremity Assessment Lower Extremity Assessment: RLE deficits/detail RLE Deficits / Details: good quad activation and no extensor lag with SLR RLE Sensation: WNL    Cervical / Trunk Assessment Cervical / Trunk Assessment: Normal  Communication   Communication: No difficulties  Cognition Arousal/Alertness: Awake/alert Behavior During Therapy: WFL for tasks assessed/performed Overall Cognitive Status: Within Functional Limits for tasks assessed           General Comments      Exercises Total Joint Exercises Ankle Circles/Pumps: AROM;10 reps;Seated;Both Quad Sets: AROM;5 reps;Seated;Right   Assessment/Plan    PT Assessment Patient needs continued PT services  PT Problem List Decreased strength;Decreased balance;Decreased mobility;Decreased range of motion;Decreased activity tolerance;Decreased knowledge of use of DME       PT Treatment Interventions DME instruction;Functional mobility training;Balance training;Patient/family education;Therapeutic activities;Gait training;Stair training;Therapeutic exercise    PT Goals (Current goals can be found in the Care Plan section)  Acute Rehab PT Goals Patient Stated Goal: to be safe going up/down stairs PT Goal Formulation: With patient Time For Goal Achievement: 12/07/19 Potential to Achieve Goals: Good    Frequency 7X/week    AM-PAC PT "6 Clicks" Mobility  Outcome Measure Help needed turning from your back to your side while in a flat bed without using bedrails?: None Help needed moving from lying on your back to sitting on the side of a flat bed without using bedrails?: A Little Help needed moving to and from a bed to a chair (including a wheelchair)?: A Little Help needed standing up from a chair using your arms (e.g., wheelchair or bedside chair)?: A Little Help needed to walk in hospital room?: A Little Help needed climbing 3-5 steps with a railing? : A Little 6 Click Score: 19    End of Session Equipment  Utilized During Treatment: Gait belt Activity Tolerance: Patient tolerated treatment well Patient left: with call bell/phone within reach;in chair;with family/visitor present;with chair alarm set Nurse Communication: Mobility status PT Visit Diagnosis: Muscle weakness (generalized) (M62.81);Difficulty in walking, not elsewhere classified (R26.2)    Time: [id number] PT Time Calculation (min) (ACUTE ONLY): 20 min   Charges:   PT Evaluation $PT Eval Low Complexity: 1 Low          Verner Mould, DPT Physical Therapist with [facility] [phone]  11/30/2019 5:31 PM

## 2019-11-30 NOTE — Anesthesia Procedure Notes (Signed)
Procedure Name: LMA Insertion Date/Time: 11/30/2019 8:27 AM Performed by: Maxwell Caul, CRNA Pre-anesthesia Checklist: Patient identified, Emergency Drugs available, Suction available and Patient being monitored Patient Re-evaluated:Patient Re-evaluated prior to induction Oxygen Delivery Method: Circle system utilized Preoxygenation: Pre-oxygenation with 100% oxygen Induction Type: IV induction LMA: LMA inserted LMA Size: 4.0 Number of attempts: 1 Placement Confirmation: positive ETCO2 and breath sounds checked- equal and bilateral Tube secured with: Tape Dental Injury: Teeth and Oropharynx as per pre-operative assessment

## 2019-11-30 NOTE — Op Note (Signed)
TOTAL KNEE REPLACEMENT OPERATIVE NOTE:  11/30/2019  3:44 PM  PATIENT:  Olivia [name]  69 y.o. female  PRE-OPERATIVE DIAGNOSIS:  Right patellar clunk  POST-OPERATIVE DIAGNOSIS:  Right patellar clunk  PROCEDURE:  Procedure(s): SYNOVECTOMY WITH POLY EXCHANGE  SURGEON:  Surgeon(s): Vickey Huger, MD  PHYSICIAN ASSISTANT: Nehemiah Massed, PA-C  ANESTHESIA:   spinal  SPECIMEN: None  COUNTS:  Correct  TOURNIQUET:   Total Tourniquet Time Documented: Thigh (Right) - 16 minutes Total: Thigh (Right) - 16 minutes   DICTATION:  Indication for procedure:    The patient is a 69 y.o. female who has failed conservative treatment for Right patellar clunk.  Informed consent was obtained prior to anesthesia. The risks versus benefits of the operation were explain and in a way the patient can, and did, understand.     Description of procedure:     The patient was taken to the operating room and placed under anesthesia.  The patient was positioned in the usual fashion taking care that all body parts were adequately padded and/or protected.  A tourniquet was applied and the leg prepped and draped in the usual sterile fashion.  The extremity was exsanguinated with the esmarch and tourniquet inflated to 350 mmHg.  Pre-operative range of motion was normal.    A midline incision approximately 6-7 inches long was made with a #10 blade.  A new blade was used to make a parapatellar arthrotomy going 2-3 cm into the quadriceps tendon, over the patella, and alongside the medial aspect of the patellar tendon.  A synovectomy was then performed with the #10 blade and forceps. I then elevated the deep MCL off the medial tibial metaphysis subperiosteally around to the semimembranosus attachment.    I then used a 1 inch straight osteotome to remove the Depuy Sigma RP poly. I then evaluated the patella, femur, and tibia and found no signs of loosening and appropriate gap balance.    I then used a size 4 78mm poly  insert trial and found that to be the appropriate size.  I had excellent flexion/extension gap balance, excellent patella tracking.  Flexion was full and beyond 120 degrees; extension was zero.  These components were chosen and the staff opened them to me on the back table while the knee was lavaged copiously and the cement mixed.   The capsule was infilltrated with a 60cc exparel/marcaine/saline mixture.   Once the cement was hard, the tourniquet was let down.  Hemostasis was obtained.  The arthrotomy was closed using a #1 stratofix running suture.  The deep soft tissues were closed with #0 vicryls and the subcuticular layer closed with #2-0 vicryl.  The skin was reapproximated and closed with 3.0 Monocryl.  The wound was covered with steristrips, aquacel dressing, and a TED stocking.   The patient was then awakened, extubated, and taken to the recovery room in stable condition.  BLOOD LOSS:  0000000 COMPLICATIONS:  None.  PLAN OF CARE: Admit for overnight observation  PATIENT DISPOSITION:  PACU - hemodynamically stable.   Please fax a copy of this op note to my office at [phone] (please only include page 1 and 2 of the Case Information op note)

## 2019-11-30 NOTE — Transfer of Care (Signed)
Immediate Anesthesia Transfer of Care Note  Patient: Olivia [name]  Procedure(s) Performed: SYNOVECTOMY WITH POLY EXCHANGE (Right Knee)  Patient Location: PACU  Anesthesia Type:General  Level of Consciousness: awake and alert   Airway & Oxygen Therapy: Patient Spontanous Breathing  Post-op Assessment: Report given to RN and Post -op Vital signs reviewed and stable  Post vital signs: Reviewed and stable  Last Vitals:  Vitals Value Taken Time  BP 127/62 11/30/19 0953  Temp    Pulse 72 11/30/19 0955  Resp 12 11/30/19 0955  SpO2 100 % 11/30/19 0955  Vitals shown include unvalidated device data.  Last Pain:  Vitals:   11/30/19 0634  TempSrc:   PainSc: 0-No pain         Complications: No apparent anesthesia complications

## 2019-11-30 NOTE — Anesthesia Postprocedure Evaluation (Signed)
Anesthesia Post Note  Patient: Olivia [name]  Procedure(s) Performed: SYNOVECTOMY WITH POLY EXCHANGE (Right Knee)     Patient location during evaluation: PACU Anesthesia Type: General Level of consciousness: awake and alert and oriented Pain management: pain level controlled Vital Signs Assessment: post-procedure vital signs reviewed and stable Respiratory status: spontaneous breathing, nonlabored ventilation and respiratory function stable Cardiovascular status: blood pressure returned to baseline Postop Assessment: no apparent nausea or vomiting Anesthetic complications: no    Last Vitals:  Vitals:   11/30/19 1030 11/30/19 1045  BP: (!) 105/57 107/63  Pulse: 65 64  Resp: (!) 9 19  Temp:  (!) 36.4 C  SpO2: 98% 100%    Last Pain:  Vitals:   11/30/19 1045  TempSrc:   PainSc: [place]

## 2019-11-30 NOTE — Anesthesia Procedure Notes (Addendum)
Anesthesia Regional Block: Adductor canal block   Pre-Anesthetic Checklist: ,, timeout performed, Correct Patient, Correct Site, Correct Laterality, Correct Procedure, Correct Position, site marked, Risks and benefits discussed, pre-op evaluation,  At surgeon's request and post-op pain management  Laterality: Right  Prep: Maximum Sterile Barrier Precautions used, chloraprep       Needles:  Injection technique: Single-shot  Needle Type: Echogenic Stimulator Needle     Needle Length: 9cm  Needle Gauge: 22     Additional Needles:   Procedures:,,,, ultrasound used (permanent image in chart),,,,  Narrative:  Start time: 11/30/2019 7:55 AM End time: 11/30/2019 7:58 AM Injection made incrementally with aspirations every 5 mL.  Performed by: Personally  Anesthesiologist: Brennan Bailey, MD  Additional Notes: Risks, benefits, and alternative discussed. Patient gave consent for procedure. Patient prepped and draped in sterile fashion. Sedation administered, patient remains easily responsive to voice. Relevant anatomy identified with ultrasound guidance. Local anesthetic given in 5cc increments with no signs or symptoms of intravascular injection. No pain or paraesthesias with injection. Patient monitored throughout procedure with signs of LAST or immediate complications. Tolerated well. Ultrasound image placed in chart.  Tawny Asal, MD

## 2019-11-30 NOTE — H&P (Signed)
TUSCANY STOKELY MRN:  [id number] DOB/SEX:  05-09-1951/female  CHIEF COMPLAINT:  Painful right Knee  HISTORY: Patient is a 70 y.o. female presented with a history of pain in the right knee. Onset of symptoms was gradual starting a few years ago with gradually worsening course since that time. Patient has been treated conservatively with over-the-counter NSAIDs and activity modification. Patient currently rates pain in the knee at 10 out of 10 with activity. There is pain at night.  PAST MEDICAL HISTORY: Patient Active Problem List   Diagnosis Date Noted  . Metatarsalgia of right foot 11/11/2018  . Arthralgia of right ankle 11/11/2018  . Radiculitis 08/21/2018  . Hyponatremia 11/23/2017  . Hypothyroidism   . Hypertension   . Hyperlipidemia   . GERD (gastroesophageal reflux disease)   . Depression   . Anxiety   . Primary localized osteoarthritis of right knee 11/22/2017  . Varicose veins of leg with complications [id number]  . Varicose veins of lower extremities with other complications [id number]  . Swelling of limb-Right Leg 06/16/2014  . Pain of right lower extremity 06/16/2014   Past Medical History:  Diagnosis Date  . Anxiety    [facility]  . Arthritis   . Cataract    THESE BEEN REPLACED  . Depression    [facility]  . Diverticulosis   . Dry eye syndrome   . GERD (gastroesophageal reflux disease)   . History of colon polyps 12/03/2012   COLONOSCOPY  . History of exercise stress test 2010   Dr. Ashok Norris   . Hyperlipidemia   . Hypertension   . Hypothyroidism   . Multinodular goiter    DR. KERR  . OSA (obstructive sleep apnea)    (PSG 12/12/15 ESS 2, AHI 42/HR REM 30/HR, O2 MIN 80%)  CPAP- q night , done with [facility]  [facility] done at [facility]  . Squamous cell carcinoma in situ 2015  . Thyroid disease    Nodules  . Tubular adenoma    DR. [place]  . Varicose veins    Past Surgical History:  Procedure Laterality Date  . BREAST EXCISIONAL  BIOPSY Left    benign  . CHOLECYSTECTOMY  2003   Gall Bladder  . COLONOSCOPY  06/25/2017   DR. Darke Right    Cataract  . EYE SURGERY Left    Cataract  . fibroid adenoma  1989 and [place]  . SEPTOPLASTY     for deviated septum  . TOTAL KNEE ARTHROPLASTY Right 11/22/2017   Procedure: RIGHT TOTAL KNEE ARTHROPLASTY;  Surgeon: Earlie Server, MD;  Location: [facility];  Service: Orthopedics;  Laterality: Right;     MEDICATIONS:   Medications Prior to Admission  Medication Sig Dispense Refill Last Dose  . acetaminophen (TYLENOL) 500 MG tablet Take 2 tablets (1,000 mg total) by mouth every 6 (six) hours as needed for mild pain or moderate pain. 30 tablet 0 11/29/2019 at Unknown time  . atenolol (TENORMIN) 25 MG tablet Take 25 mg by mouth at bedtime.    11/29/2019 at 2200  . CALCIUM-MAGNESIUM-VITAMIN D PO Take 1 tablet by mouth daily.   11/29/2019 at Unknown time  . clonazePAM (KLONOPIN) 1 MG tablet Take 1 tablet (1 mg total) by mouth 3 (three) times daily as needed for anxiety. 90 tablet 2 11/30/2019 at Unknown time  . esomeprazole (NEXIUM) 40 MG capsule Take 40 mg by mouth 2 (two) times daily before a meal.    11/30/2019 at Unknown time  .  famotidine (PEPCID) 10 MG tablet Take 10 mg by mouth daily as needed for heartburn or indigestion.   11/29/2019 at Unknown time  . FLUoxetine (PROZAC) 40 MG capsule Take 1 capsule (40 mg total) by mouth daily. (Patient taking differently: Take 40 mg by mouth daily after supper. ) 30 capsule 5 11/29/2019 at Unknown time  . lisinopril (PRINIVIL,ZESTRIL) 20 MG tablet Take 20 mg by mouth daily.   1 11/29/2019 at Unknown time  . Multiple Vitamin (MULITIVITAMIN WITH MINERALS) TABS Take 1 tablet by mouth daily.   Past Week at Unknown time  . pregabalin (LYRICA) 100 MG capsule Take 1 capsule (100 mg total) by mouth 2 (two) times daily. 60 capsule 5 11/30/2019 at Unknown time  . SYNTHROID 125 MCG tablet Take 125 mcg by mouth at bedtime.    11/30/2019 at Unknown time   . tiZANidine (ZANAFLEX) 4 MG tablet Take 4 mg by mouth every 8 (eight) hours as needed for muscle spasms.   0 11/29/2019 at Unknown time  . vitamin C (ASCORBIC ACID) 500 MG tablet Take 500 mg by mouth 2 (two) times a week.    11/29/2019 at Unknown time    ALLERGIES:   Allergies  Allergen Reactions  . Demerol [Meperidine] Nausea And Vomiting  . Penicillins Other (See Comments)    Unknown childhood allergy Has patient had a PCN reaction causing immediate rash, facial/tongue/throat swelling, SOB or lightheadedness with hypotension: Unknown Has patient had a PCN reaction causing severe rash involving mucus membranes or skin necrosis: Unknown Has patient had a PCN reaction that required hospitalization: Unknown Has patient had a PCN reaction occurring within the last 10 years: No If all of the above answers are "NO", then may proceed with Cephalosporin use.     REVIEW OF SYSTEMS:  A comprehensive review of systems was negative except for: Musculoskeletal: positive for muscle weakness and myalgias   FAMILY HISTORY:   Family History  Problem Relation Age of Onset  . Dementia Mother 69  . Hypertension Father   . Emphysema Father   . Healthy Son   . Emphysema Maternal Grandfather   . Healthy Son     SOCIAL HISTORY:   Social History   Tobacco Use  . Smoking status: Never Smoker  . Smokeless tobacco: Never Used  Substance Use Topics  . Alcohol use: No     EXAMINATION:  Vital signs in last 24 hours: Temp:  [98.1 F (36.7 C)] 98.1 F (36.7 C) (03/01 0624) Pulse Rate:  [65] 65 (03/01 0624) Resp:  [19] 19 (03/01 0624) BP: (112)/(70) 112/70 (03/01 0624) SpO2:  [94 %] 94 % (03/01 0624) Weight:  [110.7 kg] 110.7 kg (03/01 0634)  BP 112/70   Pulse 65   Temp 98.1 F (36.7 C) (Oral)   Resp 19   Wt 110.7 kg   SpO2 94%   BMI 38.22 kg/m   General Appearance:    Alert, cooperative, no distress, appears stated age  Head:    Normocephalic, without obvious abnormality,  atraumatic  Eyes:    PERRL, conjunctiva/corneas clear, EOM's intact, fundi    benign, both eyes  Ears:    Normal TM's and external ear canals, both ears  Nose:   Nares normal, septum midline, mucosa normal, no drainage    or sinus tenderness  Throat:   Lips, mucosa, and tongue normal; teeth and gums normal  Neck:   Supple, symmetrical, trachea midline, no adenopathy;    thyroid:  no enlargement/tenderness/nodules; no carotid  bruit or JVD  Back:     Symmetric, no curvature, ROM normal, no CVA tenderness  Lungs:     Clear to auscultation bilaterally, respirations unlabored  Chest Wall:    No tenderness or deformity   Heart:    Regular rate and rhythm, S1 and S2 normal, no murmur, rub   or gallop  Breast Exam:    No tenderness, masses, or nipple abnormality  Abdomen:     Soft, non-tender, bowel sounds active all four quadrants,    no masses, no organomegaly  Genitalia:    Normal female without lesion, discharge or tenderness  Rectal:    Normal tone, normal prostate, no masses or tenderness;   guaiac negative stool  Extremities:   Extremities normal, atraumatic, no cyanosis or edema  Pulses:   2+ and symmetric all extremities  Skin:   Skin color, texture, turgor normal, no rashes or lesions  Lymph nodes:   Cervical, supraclavicular, and axillary nodes normal  Neurologic:   CNII-XII intact, normal strength, sensation and reflexes    throughout    Musculoskeletal:  ROM 0-120, Ligaments intact,  Imaging Review Plain radiographs demonstrate s/p tka of the right knee. The overall alignment is neutral. The bone quality appears to be good for age and reported activity level.  Assessment/Plan: Failed tka  The patient history, physical examination and imaging studies are consistent with failed tka of the right knee. The patient has failed conservative treatment.  The clearance notes were reviewed.  After discussion with the patient it was felt that open synovectomy and poly exchange was  indicated. The procedure,  risks, and benefits of  were presented and reviewed. The risks including but not limited to aseptic loosening, infection, blood clots, vascular injury, stiffness, patella tracking problems complications among others were discussed. The patient acknowledged the explanation, agreed to proceed with the plan.  Preoperative templating of the joint replacement has been completed, documented, and submitted to the Operating Room personnel in order to optimize intra-operative equipment management.    Patient's anticipated LOS is less than 2 midnights, meeting these requirements: - Lives within 1 hour of care - Has a competent adult at home to recover with post-op recover - NO history of  - Chronic pain requiring opiods  - Diabetes  - Coronary Artery Disease  - Heart failure  - Heart attack  - Stroke  - DVT/VTE  - Cardiac arrhythmia  - Respiratory Failure/COPD  - Renal failure  - Anemia  - Advanced Liver disease       Donia Ast 11/30/2019, 7:00 AM

## 2019-12-01 ENCOUNTER — Encounter: Payer: Self-pay | Admitting: *Deleted

## 2019-12-01 DIAGNOSIS — K219 Gastro-esophageal reflux disease without esophagitis: Secondary | ICD-10-CM | POA: Diagnosis not present

## 2019-12-01 DIAGNOSIS — F419 Anxiety disorder, unspecified: Secondary | ICD-10-CM | POA: Diagnosis not present

## 2019-12-01 DIAGNOSIS — G4733 Obstructive sleep apnea (adult) (pediatric): Secondary | ICD-10-CM | POA: Diagnosis not present

## 2019-12-01 DIAGNOSIS — T84092A Other mechanical complication of internal right knee prosthesis, initial encounter: Secondary | ICD-10-CM | POA: Diagnosis not present

## 2019-12-01 DIAGNOSIS — I10 Essential (primary) hypertension: Secondary | ICD-10-CM | POA: Diagnosis not present

## 2019-12-01 DIAGNOSIS — T8484XA Pain due to internal orthopedic prosthetic devices, implants and grafts, initial encounter: Secondary | ICD-10-CM | POA: Diagnosis not present

## 2019-12-01 LAB — CBC
HCT: 31 % — ABNORMAL LOW (ref 36.0–46.0)
Hemoglobin: 10 g/dL — ABNORMAL LOW (ref 12.0–15.0)
MCH: 30.4 pg (ref 26.0–34.0)
MCHC: 32.3 g/dL (ref 30.0–36.0)
MCV: 94.2 fL (ref 80.0–100.0)
Platelets: 244 10*3/uL (ref 150–400)
RBC: 3.29 MIL/uL — ABNORMAL LOW (ref 3.87–5.11)
RDW: 15.8 % — ABNORMAL HIGH (ref 11.5–15.5)
WBC: 7.4 10*3/uL (ref 4.0–10.5)
nRBC: 0 % (ref 0.0–0.2)

## 2019-12-01 LAB — BASIC METABOLIC PANEL
Anion gap: 9 (ref 5–15)
BUN: 14 mg/dL (ref 8–23)
CO2: 23 mmol/L (ref 22–32)
Calcium: 8.3 mg/dL — ABNORMAL LOW (ref 8.9–10.3)
Chloride: 98 mmol/L (ref 98–111)
Creatinine, Ser: 0.84 mg/dL (ref 0.44–1.00)
GFR calc Af Amer: >60 mL/min (ref >60)
GFR calc non Af Amer: >60 mL/min (ref >60)
Glucose, Bld: 94 mg/dL (ref 70–99)
Potassium: 4.2 mmol/L (ref 3.5–5.1)
Sodium: 130 mmol/L — ABNORMAL LOW (ref 135–145)

## 2019-12-01 MED ORDER — OXYCODONE HCL 5 MG PO TABS: 5.0000 mg | ORAL_TABLET | Freq: Four times a day (QID) | ORAL | 0 refills | Status: DC | PRN

## 2019-12-01 MED ORDER — ASPIRIN 325 MG PO TBEC: 325.0000 mg | DELAYED_RELEASE_TABLET | Freq: Two times a day (BID) | ORAL | 0 refills | Status: AC

## 2019-12-01 MED ORDER — METHOCARBAMOL 500 MG PO TABS: 500.0000 mg | ORAL_TABLET | Freq: Four times a day (QID) | ORAL | 0 refills | Status: AC | PRN

## 2019-12-01 NOTE — Progress Notes (Signed)
Physical Therapy Treatment Patient Details Name: Olivia [name] MRN: [id number] DOB: 1951-09-21 Today's Date: 12/01/2019    History of Present Illness Patient is 69 y.o female s/p Rt TKR (polyexchange) on 11/30/19 to revise Rt TKA performed on 11/02/17. PMH significant for HTN, HLD, OSA, depression, anxiety.    PT Comments    The patient performed Exercises and tolerated well. Ready for Dc.    Follow Up Recommendations  Follow surgeon's recommendation for DC plan and follow-up therapies     Equipment Recommendations  None recommended by PT    Recommendations for Other Services       Precautions / Restrictions Precautions Precautions: Fall;Knee    Mobility  Bed Mobility  Wheelchair Mobility    Modified Rankin (Stroke Patients Only)       Balance Overall balance assessment: Needs assistance Sitting-balance support: Feet supported Sitting balance-Leahy Scale: Good                                 Cognition Arousal/Alertness: Awake/alert                                            Exercises Total Joint Exercises Ankle Circles/Pumps: AROM;10 reps;Seated;Both Quad Sets: AROM;Seated;Right;10 reps Heel Slides: AAROM;Right;10 reps;Supine Hip ABduction/ADduction: AROM;10 reps;Right;Supine Long Arc Quad: AROM;Right;10 reps;Seated Knee Flexion: AROM;Right;10 reps;Seated Goniometric ROM: 5-90 knee flexion    General Comments        Pertinent Vitals/Pain Pain Score: 4  Pain Location: Rt knee Pain Descriptors / Indicators: Aching;Burning;Sore Pain Intervention(s): Monitored during session;Premedicated before session;Ice applied    Home Living                      Prior Function            PT Goals (current goals can now be found in the care plan section) Progress towards PT goals: Progressing toward goals    Frequency    7X/week      PT Plan Current plan remains appropriate    Co-evaluation               AM-PAC PT "6 Clicks" Mobility   Outcome Measure  Help needed turning from your back to your side while in a flat bed without using bedrails?: None Help needed moving from lying on your back to sitting on the side of a flat bed without using bedrails?: None Help needed moving to and from a bed to a chair (including a wheelchair)?: A Little Help needed standing up from a chair using your arms (e.g., wheelchair or bedside chair)?: A Little Help needed to walk in hospital room?: A Little Help needed climbing 3-5 steps with a railing? : A Little 6 Click Score: 20    End of Session Equipment Utilized During Treatment: Gait belt Activity Tolerance: Patient tolerated treatment well Patient left: in chair;with call bell/phone within reach Nurse Communication: Mobility status PT Visit Diagnosis: Muscle weakness (generalized) (M62.81);Difficulty in walking, not elsewhere classified (R26.2)     Time: [id number] PT Time Calculation (min) (ACUTE ONLY): 18 min  Charges:  $Gait Training: 23-37 mins $Therapeutic Exercise: 8-22 mins                     [place] Pager [phone] Office [phone]  Claretha Cooper 12/01/2019, 1:20 PM

## 2019-12-01 NOTE — Progress Notes (Signed)
Physical Therapy Treatment Patient Details Name: Olivia [name] MRN: [id number] DOB: 27-May-1951 Today's Date: 12/01/2019    History of Present Illness Patient is 69 y.o female s/p Rt TKR (polyexchange) on 11/30/19 to revise Rt TKA performed on 11/02/17. PMH significant for HTN, HLD, OSA, depression, anxiety.    PT Comments    The patient performed stairs very well using a rail and a crutch. Patient provided a crutch for home.Patient will rest and review exercises.   Follow Up Recommendations  Follow surgeon's recommendation for DC plan and follow-up therapies     Equipment Recommendations  None recommended by PT    Recommendations for Other Services       Precautions / Restrictions Precautions Precautions: Fall;Knee    Mobility  Bed Mobility Overal bed mobility: Needs Assistance Bed Mobility: Supine to Sit     Supine to sit: HOB elevated;Min assist     General bed mobility comments: cues for use of bed rail,  no assist to bring Rt LE to EOB.  Transfers Overall transfer level: Needs assistance Equipment used: Rolling walker (2 wheeled) Transfers: Sit to/from Stand Sit to Stand: Supervision         General transfer comment: cues for safe hand placement and technique with RW,.  Ambulation/Gait Ambulation/Gait assistance: Min guard Gait Distance (Feet): 30 Feet Assistive device: Rolling walker (2 wheeled) Gait Pattern/deviations: Step-to pattern;Decreased stride length;Decreased stance time - right;Decreased step length - left Gait velocity: decreased   General Gait Details: cues for safe step pattern in RW. pt maintained safe proximity throughout, .   Stairs Stairs: Yes Stairs assistance: Min assist Stair Management: One rail Left;Forwards;With crutches Number of Stairs: 2 General stair comments: patient instructed in  use of crutch and rail, performed well   Wheelchair Mobility    Modified Rankin (Stroke Patients Only)       Balance Overall balance  assessment: Needs assistance Sitting-balance support: Feet supported Sitting balance-Leahy Scale: Good     Standing balance support: During functional activity;Bilateral upper extremity supported Standing balance-Leahy Scale: Good Standing balance comment: with RW                            Cognition Arousal/Alertness: Awake/alert                                            Exercises      General Comments        Pertinent Vitals/Pain Pain Score: 7  Pain Location: Rt knee Pain Descriptors / Indicators: Aching;Burning;Sore Pain Intervention(s): Premedicated before session;Monitored during session    Home Living                      Prior Function            PT Goals (current goals can now be found in the care plan section) Progress towards PT goals: Progressing toward goals    Frequency    7X/week      PT Plan Current plan remains appropriate    Co-evaluation              AM-PAC PT "6 Clicks" Mobility   Outcome Measure  Help needed turning from your back to your side while in a flat bed without using bedrails?: None Help needed moving from lying on your back to sitting on  the side of a flat bed without using bedrails?: None Help needed moving to and from a bed to a chair (including a wheelchair)?: A Little Help needed standing up from a chair using your arms (e.g., wheelchair or bedside chair)?: A Little Help needed to walk in hospital room?: A Little Help needed climbing 3-5 steps with a railing? : A Little 6 Click Score: 20    End of Session Equipment Utilized During Treatment: Gait belt Activity Tolerance: Patient tolerated treatment well Patient left: with call bell/phone within reach;in chair;with family/visitor present;with chair alarm set Nurse Communication: Mobility status PT Visit Diagnosis: Muscle weakness (generalized) (M62.81);Difficulty in walking, not elsewhere classified (R26.2)     Time:  1005-1030 PT Time Calculation (min) (ACUTE ONLY): 25 min  Charges:  $Gait Training: 23-37 mins                     [place] Pager [phone] Office [phone]    Claretha Cooper 12/01/2019, 1:06 PM

## 2019-12-01 NOTE — Discharge Summary (Signed)
SPORTS MEDICINE & JOINT REPLACEMENT   Olivia Mulch, Olivia [name]   Olivia Shadow, Olivia [name] [address], [place], [place]  [address]                             [phone]  PATIENT ID: Olivia [name]        MRN:  [id number]          DOB/AGE: 1951/03/20 / 69 y.o.    DISCHARGE SUMMARY  ADMISSION DATE:    11/30/2019 DISCHARGE DATE:   12/01/2019   ADMISSION DIAGNOSIS: S/P total knee replacement [Z96.659]    DISCHARGE DIAGNOSIS:  Right patellar clunk    ADDITIONAL DIAGNOSIS: Active Problems:   S/P total knee replacement  Past Medical History:  Diagnosis Date  . Anxiety    [facility]  . Arthritis   . Cataract    THESE BEEN REPLACED  . Depression    [facility]  . Diverticulosis   . Dry eye syndrome   . GERD (gastroesophageal reflux disease)   . History of colon polyps 12/03/2012   COLONOSCOPY  . History of exercise stress test 2010   Dr. Ashok Norris   . Hyperlipidemia   . Hypertension   . Hypothyroidism   . Multinodular goiter    DR. KERR  . OSA (obstructive sleep apnea)    (PSG 12/12/15 ESS 2, AHI 42/HR REM 30/HR, O2 MIN 80%)  CPAP- q night , done with [facility]  study done at [facility]  . Squamous cell carcinoma in situ 2015  . Thyroid disease    Nodules  . Tubular adenoma    DR. [place]  . Varicose veins     PROCEDURE: Procedure(s): SYNOVECTOMY WITH POLY EXCHANGE on 11/30/2019  CONSULTS:    HISTORY:  See H&P in chart  HOSPITAL COURSE:  Olivia [name] is a 69 y.o. admitted on 11/30/2019 and found to have a diagnosis of Right patellar clunk.  After appropriate laboratory studies were obtained  they were taken to the operating room on 11/30/2019 and underwent Procedure(s): SYNOVECTOMY WITH POLY EXCHANGE.   They were given perioperative antibiotics:  Anti-infectives (From admission, onward)   Start     Dose/Rate Route Frequency Ordered Stop   11/30/19 1430  clindamycin (CLEOCIN) IVPB 600 mg     600 mg 100 mL/hr over 30 Minutes Intravenous Every 6  hours 11/30/19 1105 11/30/19 2046   11/30/19 0630  clindamycin (CLEOCIN) IVPB 900 mg     900 mg 100 mL/hr over 30 Minutes Intravenous On call to O.R. 11/30/19 [id number] 11/30/19 [id number]    .  Patient given tranexamic acid IV or topical and exparel intra-operatively.  Tolerated the procedure well.    POD# 1: Vital signs were stable.  Patient denied Chest pain, shortness of breath, or calf pain.  Patient was started on Aspirin twice daily at 8am.  Consults to PT, OT, and care management were made.  The patient was weight bearing as tolerated.  CPM was placed on the operative leg 0-90 degrees for 6-8 hours a day. When out of the CPM, patient was placed in the foam block to achieve full extension. Incentive spirometry was taught.  Dressing was changed.       POD #2, Continued  PT for ambulation and exercise program.  IV saline locked.  O2 discontinued.    The remainder of the hospital course was dedicated to ambulation and strengthening.   The patient was discharged on  1 Day Post-Op in  Good condition.  Blood products given:none  DIAGNOSTIC STUDIES: Recent vital signs:  Patient Vitals for the past 24 hrs:  BP Temp Temp src Pulse Resp SpO2 Height Weight  12/01/19 0628 98/68 98 F (36.7 C) Oral 64 18 96 % - -  12/01/19 0137 117/77 98 F (36.7 C) Oral (!) 59 18 97 % - -  11/30/19 2025 122/62 98 F (36.7 C) Oral 63 18 100 % - -  11/30/19 1929 - - - 76 15 97 % - -  11/30/19 1507 121/75 98 F (36.7 C) Oral (!) 58 16 100 % - -  11/30/19 1344 119/66 - - 60 - 100 % - -  11/30/19 1342 (!) 131/116 97.6 F (36.4 C) Oral 60 17 100 % - -  11/30/19 1242 111/68 97.9 F (36.6 C) Oral 61 16 99 % - -  11/30/19 1130 110/60 98.2 F (36.8 C) Oral 69 16 100 % 5' 7.01" (1.702 m) 110.7 kg  11/30/19 1045 107/63 (!) 97.5 F (36.4 C) - 64 19 100 % - -  11/30/19 1030 (!) 105/57 - - 65 (!) 9 98 % - -  11/30/19 1015 102/62 - - 65 13 100 % - -  11/30/19 1000 (!) 145/123 - - 70 18 100 % - -  11/30/19 0953 127/62  97.7 F (36.5 C) - 72 10 100 % - -  11/30/19 0759 - - - (!) 55 11 95 % - -  11/30/19 0758 113/78 - - (!) 56 13 94 % - -  11/30/19 0757 - - - (!) 55 12 97 % - -  11/30/19 0756 - - - (!) 55 16 96 % - -  11/30/19 0755 - - - (!) 55 14 96 % - -  11/30/19 0754 - - - (!) 56 16 96 % - -  11/30/19 0753 113/77 - - (!) 56 16 97 % - -  11/30/19 0752 - - - (!) 56 15 96 % - -  11/30/19 0751 - - - (!) 55 15 96 % - -  11/30/19 0750 - - - (!) 55 11 97 % - -  11/30/19 0749 - - - - 14 - - -  11/30/19 0748 115/77 - - - 12 - - -       Recent laboratory studies: Recent Labs    11/24/19 1450 12/01/19 0321  WBC 7.8 7.4  HGB 12.6 10.0*  HCT 37.5 31.0*  PLT 306 244   Recent Labs    11/24/19 1450 12/01/19 0321  NA 135 130*  K 4.5 4.2  CL 100 98  CO2 26 23  BUN 15 14  CREATININE 0.72 0.84  GLUCOSE 102* 94  CALCIUM 9.4 8.3*   Lab Results  Component Value Date   INR 0.97 11/11/2017     Recent Radiographic Studies :  No results found.  DISCHARGE INSTRUCTIONS: Discharge Instructions    Call Olivia [name] / Call 911   Complete by: As directed    If you experience chest pain or shortness of breath, CALL 911 and be transported to the hospital emergency room.  If you develope a fever above 101 F, pus (white drainage) or increased drainage or redness at the wound, or calf pain, call your surgeon's office.   Constipation Prevention   Complete by: As directed    Drink plenty of fluids.  Prune juice may be helpful.  You may use a stool softener, such as Colace (over the counter)  100 mg twice a day.  Use MiraLax (over the counter) for constipation as needed.   Diet - low sodium heart healthy   Complete by: As directed    Discharge instructions   Complete by: As directed    INSTRUCTIONS AFTER JOINT REPLACEMENT   Remove items at home which could result in a fall. This includes throw rugs or furniture in walking pathways ICE to the affected joint every three hours while awake for 30 minutes at a time, for at  least the first 3-5 days, and then as needed for pain and swelling.  Continue to use ice for pain and swelling. You may notice swelling that will progress down to the foot and ankle.  This is normal after surgery.  Elevate your leg when you are not up walking on it.   Continue to use the breathing machine you got in the hospital (incentive spirometer) which will help keep your temperature down.  It is common for your temperature to cycle up and down following surgery, especially at night when you are not up moving around and exerting yourself.  The breathing machine keeps your lungs expanded and your temperature down.   DIET:  As you were doing prior to hospitalization, we recommend a well-balanced diet.  DRESSING / WOUND CARE / SHOWERING  Keep the surgical dressing until follow up.  The dressing is water proof, so you can shower without any extra covering.  IF THE DRESSING FALLS OFF or the wound gets wet inside, change the dressing with sterile gauze.  Please use good hand washing techniques before changing the dressing.  Do not use any lotions or creams on the incision until instructed by your surgeon.    ACTIVITY  Increase activity slowly as tolerated, but follow the weight bearing instructions below.   No driving for 6 weeks or until further direction given by your physician.  You cannot drive while taking narcotics.  No lifting or carrying greater than 10 lbs. until further directed by your surgeon. Avoid periods of inactivity such as sitting longer than an hour when not asleep. This helps prevent blood clots.  You may return to work once you are authorized by your doctor.     WEIGHT BEARING   Weight bearing as tolerated with assist device (walker, cane, etc) as directed, use it as long as suggested by your surgeon or therapist, typically at least 4-6 weeks.   EXERCISES  Results after joint replacement surgery are often greatly improved when you follow the exercise, range of motion  and muscle strengthening exercises prescribed by your doctor. Safety measures are also important to protect the joint from further injury. Any time any of these exercises cause you to have increased pain or swelling, decrease what you are doing until you are comfortable again and then slowly increase them. If you have problems or questions, call your caregiver or physical therapist for advice.   Rehabilitation is important following a joint replacement. After just a few days of immobilization, the muscles of the leg can become weakened and shrink (atrophy).  These exercises are designed to build up the tone and strength of the thigh and leg muscles and to improve motion. Often times heat used for twenty to thirty minutes before working out will loosen up your tissues and help with improving the range of motion but do not use heat for the first two weeks following surgery (sometimes heat can increase post-operative swelling).   These exercises can be done on a training (  exercise) mat, on the floor, on a table or on a bed. Use whatever works the best and is most comfortable for you.    Use music or television while you are exercising so that the exercises are a pleasant break in your day. This will make your life better with the exercises acting as a break in your routine that you can look forward to.   Perform all exercises about fifteen times, three times per day or as directed.  You should exercise both the operative leg and the other leg as well.   Exercises include:   Quad Sets - Tighten up the muscle on the front of the thigh (Quad) and hold for 5-10 seconds.   Straight Leg Raises - With your knee straight (if you were given a brace, keep it on), lift the leg to 60 degrees, hold for 3 seconds, and slowly lower the leg.  Perform this exercise against resistance later as your leg gets stronger.  Leg Slides: Lying on your back, slowly slide your foot toward your buttocks, bending your knee up off the  floor (only go as far as is comfortable). Then slowly slide your foot back down until your leg is flat on the floor again.  Angel Wings: Lying on your back spread your legs to the side as far apart as you can without causing discomfort.  Hamstring Strength:  Lying on your back, push your heel against the floor with your leg straight by tightening up the muscles of your buttocks.  Repeat, but this time bend your knee to a comfortable angle, and push your heel against the floor.  You may put a pillow under the heel to make it more comfortable if necessary.   A rehabilitation program following joint replacement surgery can speed recovery and prevent re-injury in the future due to weakened muscles. Contact your doctor or a physical therapist for more information on knee rehabilitation.    CONSTIPATION  Constipation is defined medically as fewer than three stools per week and severe constipation as less than one stool per week.  Even if you have a regular bowel pattern at home, your normal regimen is likely to be disrupted due to multiple reasons following surgery.  Combination of anesthesia, postoperative narcotics, change in appetite and fluid intake all can affect your bowels.   YOU MUST use at least one of the following options; they are listed in order of increasing strength to get the job done.  They are all available over the counter, and you may need to use some, POSSIBLY even all of these options:    Drink plenty of fluids (prune juice may be helpful) and high fiber foods Colace 100 mg by mouth twice a day  Senokot for constipation as directed and as needed Dulcolax (bisacodyl), take with full glass of water  Miralax (polyethylene glycol) once or twice a day as needed.  If you have tried all these things and are unable to have a bowel movement in the first 3-4 days after surgery call either your surgeon or your primary doctor.    If you experience loose stools or diarrhea, hold the  medications until you stool forms back up.  If your symptoms do not get better within 1 week or if they get worse, check with your doctor.  If you experience "the worst abdominal pain ever" or develop nausea or vomiting, please contact the office immediately for further recommendations for treatment.   ITCHING:  If you experience itching with  your medications, try taking only a single pain pill, or even half a pain pill at a time.  You can also use Benadryl over the counter for itching or also to help with sleep.   TED HOSE STOCKINGS:  Use stockings on both legs until for at least 2 weeks or as directed by physician office. They may be removed at night for sleeping.  MEDICATIONS:  See your medication summary on the "After Visit Summary" that nursing will review with you.  You may have some home medications which will be placed on hold until you complete the course of blood thinner medication.  It is important for you to complete the blood thinner medication as prescribed.  PRECAUTIONS:  If you experience chest pain or shortness of breath - call 911 immediately for transfer to the hospital emergency department.   If you develop a fever greater that 101 F, purulent drainage from wound, increased redness or drainage from wound, foul odor from the wound/dressing, or calf pain - CONTACT YOUR SURGEON.                                                   FOLLOW-UP APPOINTMENTS:  If you do not already have a post-op appointment, please call the office for an appointment to be seen by your surgeon.  Guidelines for how soon to be seen are listed in your "After Visit Summary", but are typically between 1-4 weeks after surgery.  OTHER INSTRUCTIONS:   Knee Replacement:  Do not place pillow under knee, focus on keeping the knee straight while resting. CPM instructions: 0-90 degrees, 2 hours in the morning, 2 hours in the afternoon, and 2 hours in the evening. Place foam block, curve side up under heel at all times  except when in CPM or when walking.  DO NOT modify, tear, cut, or change the foam block in any way.  MAKE SURE YOU:  Understand these instructions.  Get help right away if you are not doing well or get worse.    Thank you for letting us be a part of your medical care team.  It is a privilege we respect greatly.  We hope these instructions will help you stay on track for a fast and full recovery!   Increase activity slowly as tolerated   Complete by: As directed       DISCHARGE MEDICATIONS:   Allergies as of 12/01/2019      Reactions   Demerol [meperidine] Nausea And Vomiting   Penicillins Other (See Comments)   Unknown childhood allergy Has patient had a PCN reaction causing immediate rash, facial/tongue/throat swelling, SOB or lightheadedness with hypotension: Unknown Has patient had a PCN reaction causing severe rash involving mucus membranes or skin necrosis: Unknown Has patient had a PCN reaction that required hospitalization: Unknown Has patient had a PCN reaction occurring within the last 10 years: No If all of the above answers are "NO", then may proceed with Cephalosporin use.      Medication List    STOP taking these medications   tiZANidine 4 MG tablet Commonly known as: ZANAFLEX     TAKE these medications   acetaminophen 500 MG tablet Commonly known as: TYLENOL Take 2 tablets (1,000 mg total) by mouth every 6 (six) hours as needed for mild pain or moderate pain.   aspirin 325 MG EC  tablet Take 1 tablet (325 mg total) by mouth 2 (two) times daily.   atenolol 25 MG tablet Commonly known as: TENORMIN Take 25 mg by mouth at bedtime.   CALCIUM-MAGNESIUM-VITAMIN D PO Take 1 tablet by mouth daily.   clonazePAM 1 MG tablet Commonly known as: KLONOPIN Take 1 tablet (1 mg total) by mouth 3 (three) times daily as needed for anxiety.   esomeprazole 40 MG capsule Commonly known as: NEXIUM Take 40 mg by mouth 2 (two) times daily before a meal.   famotidine 10 MG  tablet Commonly known as: PEPCID Take 10 mg by mouth daily as needed for heartburn or indigestion.   FLUoxetine 40 MG capsule Commonly known as: PROzac Take 1 capsule (40 mg total) by mouth daily. What changed: when to take this   lisinopril 20 MG tablet Commonly known as: ZESTRIL Take 20 mg by mouth daily.   methocarbamol 500 MG tablet Commonly known as: ROBAXIN Take 1-2 tablets (500-1,000 mg total) by mouth every 6 (six) hours as needed for muscle spasms.   multivitamin with minerals Tabs tablet Take 1 tablet by mouth daily.   oxyCODONE 5 MG immediate release tablet Commonly known as: Oxy IR/ROXICODONE Take 1-2 tablets (5-10 mg total) by mouth every 6 (six) hours as needed for moderate pain (pain score 4-6).   pregabalin 100 MG capsule Commonly known as: Lyrica Take 1 capsule (100 mg total) by mouth 2 (two) times daily.   Synthroid 125 MCG tablet Generic drug: levothyroxine Take 125 mcg by mouth at bedtime.   vitamin C 500 MG tablet Commonly known as: ASCORBIC ACID Take 500 mg by mouth 2 (two) times a week.            Durable Medical Equipment  (From admission, onward)         Start     Ordered   11/30/19 1106  DME Walker rolling  Once    Question:  Patient needs a walker to treat with the following condition  Answer:  S/P total knee replacement   11/30/19 1105   11/30/19 1106  DME 3 n 1  Once     11/30/19 1105   11/30/19 1106  DME Bedside commode  Once    Question:  Patient needs a bedside commode to treat with the following condition  Answer:  S/P total knee replacement   11/30/19 1105          FOLLOW UP VISIT:    DISPOSITION: HOME VS. SNF  CONDITION:  Good   Olivia [name] 12/01/2019, 7:18 AM

## 2019-12-01 NOTE — Plan of Care (Signed)
  Problem: Education: Goal: Knowledge of the prescribed therapeutic regimen will improve Outcome: Adequate for Discharge Goal: Individualized Educational Video(s) Outcome: Adequate for Discharge   Problem: Activity: Goal: Ability to avoid complications of mobility impairment will improve Outcome: Adequate for Discharge Goal: Range of joint motion will improve Outcome: Adequate for Discharge   Problem: Clinical Measurements: Goal: Postoperative complications will be avoided or minimized Outcome: Adequate for Discharge   Problem: Pain Management: Goal: Pain level will decrease with appropriate interventions Outcome: Adequate for Discharge   Problem: Skin Integrity: Goal: Will show signs of wound healing Outcome: Adequate for Discharge   Problem: Health Behavior/Discharge Planning: Goal: Ability to manage health-related needs will improve Outcome: Adequate for Discharge   Problem: Education: Goal: Knowledge of General Education information will improve Description: Including pain rating scale, medication(s)/side effects and non-pharmacologic comfort measures Outcome: Adequate for Discharge   Problem: Clinical Measurements: Goal: Ability to maintain clinical measurements within normal limits will improve Outcome: Adequate for Discharge Goal: Will remain free from infection Outcome: Adequate for Discharge Goal: Diagnostic test results will improve Outcome: Adequate for Discharge Goal: Respiratory complications will improve Outcome: Adequate for Discharge Goal: Cardiovascular complication will be avoided Outcome: Adequate for Discharge   Problem: Health Behavior/Discharge Planning: Goal: Ability to manage health-related needs will improve Outcome: Adequate for Discharge   Problem: Activity: Goal: Risk for activity intolerance will decrease Outcome: Adequate for Discharge   Problem: Nutrition: Goal: Adequate nutrition will be maintained Outcome: Adequate for  Discharge   Problem: Coping: Goal: Level of anxiety will decrease Outcome: Adequate for Discharge   Problem: Elimination: Goal: Will not experience complications related to bowel motility Outcome: Adequate for Discharge Goal: Will not experience complications related to urinary retention Outcome: Adequate for Discharge   Problem: Pain Managment: Goal: General experience of comfort will improve Outcome: Adequate for Discharge   Problem: Safety: Goal: Ability to remain free from injury will improve Outcome: Adequate for Discharge   Problem: Skin Integrity: Goal: Risk for impaired skin integrity will decrease Outcome: Adequate for Discharge

## 2019-12-01 NOTE — Progress Notes (Signed)
Notified Dr. Annye English- PA that patient has yet to void and bladder scan residual. Pt at first refusing in and out cath. Educated patient- in and out cath performed. Carlyon Shadow PA order "send patient home after in and out cath performed". Patient states "I am unable to pee at unfamiliar places, this happens a lot when I go to Dr. Paulene Floor and am unable to pee, I know I will be able to once I go home; it;s a mental thing" educated patient and patient son. Will continue to monitor closely. Call bell within reach.

## 2019-12-01 NOTE — Progress Notes (Signed)
SPORTS MEDICINE AND JOINT REPLACEMENT  Lara Mulch, MD    Carlyon Shadow, PA-C [address], [place], [place]  [address]                             [phone]   PROGRESS NOTE  Subjective:  negative for Chest Pain  negative for Shortness of Breath  negative for Nausea/Vomiting   negative for Calf Pain  negative for Bowel Movement   Tolerating Diet: yes         Patient reports pain as 3 on 0-10 scale.    Objective: Vital signs in last 24 hours:    Patient Vitals for the past 24 hrs:  BP Temp Temp src Pulse Resp SpO2 Height Weight  12/01/19 0628 98/68 98 F (36.7 C) Oral 64 18 96 % -- --  12/01/19 0137 117/77 98 F (36.7 C) Oral (!) 59 18 97 % -- --  11/30/19 2025 122/62 98 F (36.7 C) Oral 63 18 100 % -- --  11/30/19 1929 -- -- -- 76 15 97 % -- --  11/30/19 1507 121/75 98 F (36.7 C) Oral (!) 58 16 100 % -- --  11/30/19 1344 119/66 -- -- 60 -- 100 % -- --  11/30/19 1342 (!) 131/116 97.6 F (36.4 C) Oral 60 17 100 % -- --  11/30/19 1242 111/68 97.9 F (36.6 C) Oral 61 16 99 % -- --  11/30/19 1130 110/60 98.2 F (36.8 C) Oral 69 16 100 % 5' 7.01" (1.702 m) 110.7 kg  11/30/19 1045 107/63 (!) 97.5 F (36.4 C) -- 64 19 100 % -- --  11/30/19 1030 (!) 105/57 -- -- 65 (!) 9 98 % -- --  11/30/19 1015 102/62 -- -- 65 13 100 % -- --  11/30/19 1000 (!) 145/123 -- -- 70 18 100 % -- --  11/30/19 0953 127/62 97.7 F (36.5 C) -- 72 10 100 % -- --  11/30/19 0759 -- -- -- (!) 55 11 95 % -- --  11/30/19 0758 113/78 -- -- (!) 56 13 94 % -- --  11/30/19 0757 -- -- -- (!) 55 12 97 % -- --  11/30/19 0756 -- -- -- (!) 55 16 96 % -- --  11/30/19 0755 -- -- -- (!) 55 14 96 % -- --  11/30/19 0754 -- -- -- (!) 56 16 96 % -- --  11/30/19 0753 113/77 -- -- (!) 56 16 97 % -- --  11/30/19 0752 -- -- -- (!) 56 15 96 % -- --  11/30/19 0751 -- -- -- (!) 55 15 96 % -- --  11/30/19 0750 -- -- -- (!) 55 11 97 % -- --  11/30/19 0749 -- -- -- -- 14 -- -- --  11/30/19 0748 115/77 -- -- --  12 -- -- --    @flow {1959:LAST@   Intake/Output from previous day:   03/01 0701 - 03/02 0700 In: 3733.2 [P.O.:1080; I.V.:2453.2] Out: 2975 [Urine:2875]   Intake/Output this shift:   No intake/output data recorded.   Intake/Output      03/01 0701 - 03/02 0700 03/02 0701 - 03/03 0700   P.O. 1080    I.V. (mL/kg) 2453.2 (22.2)    IV Piggyback 200    Total Intake(mL/kg) 3733.2 (33.7)    Urine (mL/kg/hr) 2875 (1.1)    Blood 100    Total Output 2975    Net +758.2  LABORATORY DATA: Recent Labs    11/24/19 1450 12/01/19 0321  WBC 7.8 7.4  HGB 12.6 10.0*  HCT 37.5 31.0*  PLT 306 244   Recent Labs    11/24/19 1450 12/01/19 0321  NA 135 130*  K 4.5 4.2  CL 100 98  CO2 26 23  BUN 15 14  CREATININE 0.72 0.84  GLUCOSE 102* 94  CALCIUM 9.4 8.3*   Lab Results  Component Value Date   INR 0.97 11/11/2017    Examination:  General appearance: alert, cooperative and no distress Extremities: extremities normal, atraumatic, no cyanosis or edema  Wound Exam: clean, dry, intact   Drainage:  None: wound tissue dry  Motor Exam: Quadriceps and Hamstrings Intact  Sensory Exam: Superficial Peroneal, Deep Peroneal and Tibial normal   Assessment:    1 Day Post-Op  Procedure(s) (LRB): SYNOVECTOMY WITH POLY EXCHANGE (Right)  ADDITIONAL DIAGNOSIS:  Active Problems:   S/P total knee replacement     Plan: Physical Therapy as ordered Weight Bearing as Tolerated (WBAT)  DVT Prophylaxis:  Aspirin  DISCHARGE PLAN: Home   Patient doing well, expect d/c home today  Patient's anticipated LOS is less than 2 midnights, meeting these requirements: - Younger than 55 - Lives within 1 hour of care - Has a competent adult at home to recover with post-op recover - NO history of  - Chronic pain requiring opiods  - Diabetes  - Coronary Artery Disease  - Heart failure  - Heart attack  - Stroke  - DVT/VTE  - Cardiac arrhythmia  - Respiratory Failure/COPD  -  Renal failure  - Anemia  - Advanced Liver disease        Donia Ast 12/01/2019, 7:15 AM

## 2019-12-01 NOTE — Progress Notes (Signed)
Therapy Plan: OPPT DME-Has RW, no equipment needs.

## 2019-12-08 DIAGNOSIS — M25561 Pain in right knee: Secondary | ICD-10-CM | POA: Diagnosis not present

## 2019-12-08 DIAGNOSIS — M25661 Stiffness of right knee, not elsewhere classified: Secondary | ICD-10-CM | POA: Diagnosis not present

## 2019-12-08 DIAGNOSIS — Z96651 Presence of right artificial knee joint: Secondary | ICD-10-CM | POA: Diagnosis not present

## 2019-12-10 DIAGNOSIS — Z96652 Presence of left artificial knee joint: Secondary | ICD-10-CM | POA: Diagnosis not present

## 2019-12-10 DIAGNOSIS — M25661 Stiffness of right knee, not elsewhere classified: Secondary | ICD-10-CM | POA: Diagnosis not present

## 2019-12-10 DIAGNOSIS — M25561 Pain in right knee: Secondary | ICD-10-CM | POA: Diagnosis not present

## 2019-12-10 DIAGNOSIS — Z96651 Presence of right artificial knee joint: Secondary | ICD-10-CM | POA: Diagnosis not present

## 2019-12-17 DIAGNOSIS — Z96651 Presence of right artificial knee joint: Secondary | ICD-10-CM | POA: Diagnosis not present

## 2019-12-17 DIAGNOSIS — M25561 Pain in right knee: Secondary | ICD-10-CM | POA: Diagnosis not present

## 2019-12-17 DIAGNOSIS — M25661 Stiffness of right knee, not elsewhere classified: Secondary | ICD-10-CM | POA: Diagnosis not present

## 2019-12-22 DIAGNOSIS — M25561 Pain in right knee: Secondary | ICD-10-CM | POA: Diagnosis not present

## 2019-12-22 DIAGNOSIS — M25661 Stiffness of right knee, not elsewhere classified: Secondary | ICD-10-CM | POA: Diagnosis not present

## 2019-12-22 DIAGNOSIS — Z96651 Presence of right artificial knee joint: Secondary | ICD-10-CM | POA: Diagnosis not present

## 2019-12-24 DIAGNOSIS — M1711 Unilateral primary osteoarthritis, right knee: Secondary | ICD-10-CM | POA: Diagnosis not present

## 2019-12-24 DIAGNOSIS — G8929 Other chronic pain: Secondary | ICD-10-CM | POA: Diagnosis not present

## 2019-12-24 DIAGNOSIS — M5136 Other intervertebral disc degeneration, lumbar region: Secondary | ICD-10-CM | POA: Diagnosis not present

## 2019-12-24 DIAGNOSIS — M542 Cervicalgia: Secondary | ICD-10-CM | POA: Diagnosis not present

## 2019-12-24 DIAGNOSIS — Z79899 Other long term (current) drug therapy: Secondary | ICD-10-CM | POA: Diagnosis not present

## 2019-12-24 DIAGNOSIS — Z79891 Long term (current) use of opiate analgesic: Secondary | ICD-10-CM | POA: Diagnosis not present

## 2019-12-24 DIAGNOSIS — G894 Chronic pain syndrome: Secondary | ICD-10-CM | POA: Diagnosis not present

## 2019-12-29 DIAGNOSIS — Z96651 Presence of right artificial knee joint: Secondary | ICD-10-CM | POA: Diagnosis not present

## 2019-12-29 DIAGNOSIS — M25661 Stiffness of right knee, not elsewhere classified: Secondary | ICD-10-CM | POA: Diagnosis not present

## 2019-12-29 DIAGNOSIS — M25561 Pain in right knee: Secondary | ICD-10-CM | POA: Diagnosis not present

## 2020-01-05 DIAGNOSIS — M25561 Pain in right knee: Secondary | ICD-10-CM | POA: Diagnosis not present

## 2020-01-05 DIAGNOSIS — Z96651 Presence of right artificial knee joint: Secondary | ICD-10-CM | POA: Diagnosis not present

## 2020-01-05 DIAGNOSIS — M25661 Stiffness of right knee, not elsewhere classified: Secondary | ICD-10-CM | POA: Diagnosis not present

## 2020-01-06 DIAGNOSIS — S29012A Strain of muscle and tendon of back wall of thorax, initial encounter: Secondary | ICD-10-CM | POA: Diagnosis not present

## 2020-01-06 DIAGNOSIS — M9904 Segmental and somatic dysfunction of sacral region: Secondary | ICD-10-CM | POA: Diagnosis not present

## 2020-01-06 DIAGNOSIS — S338XXA Sprain of other parts of lumbar spine and pelvis, initial encounter: Secondary | ICD-10-CM | POA: Diagnosis not present

## 2020-01-06 DIAGNOSIS — M5136 Other intervertebral disc degeneration, lumbar region: Secondary | ICD-10-CM | POA: Diagnosis not present

## 2020-01-06 DIAGNOSIS — M9901 Segmental and somatic dysfunction of cervical region: Secondary | ICD-10-CM | POA: Diagnosis not present

## 2020-01-06 DIAGNOSIS — M9903 Segmental and somatic dysfunction of lumbar region: Secondary | ICD-10-CM | POA: Diagnosis not present

## 2020-01-06 DIAGNOSIS — M47812 Spondylosis without myelopathy or radiculopathy, cervical region: Secondary | ICD-10-CM | POA: Diagnosis not present

## 2020-01-06 DIAGNOSIS — M9902 Segmental and somatic dysfunction of thoracic region: Secondary | ICD-10-CM | POA: Diagnosis not present

## 2020-01-06 DIAGNOSIS — M4802 Spinal stenosis, cervical region: Secondary | ICD-10-CM | POA: Diagnosis not present

## 2020-01-07 DIAGNOSIS — M1711 Unilateral primary osteoarthritis, right knee: Secondary | ICD-10-CM | POA: Diagnosis not present

## 2020-01-07 DIAGNOSIS — G8929 Other chronic pain: Secondary | ICD-10-CM | POA: Diagnosis not present

## 2020-01-07 DIAGNOSIS — M5136 Other intervertebral disc degeneration, lumbar region: Secondary | ICD-10-CM | POA: Diagnosis not present

## 2020-01-07 DIAGNOSIS — M542 Cervicalgia: Secondary | ICD-10-CM | POA: Diagnosis not present

## 2020-01-13 DIAGNOSIS — M9901 Segmental and somatic dysfunction of cervical region: Secondary | ICD-10-CM | POA: Diagnosis not present

## 2020-01-13 DIAGNOSIS — M9904 Segmental and somatic dysfunction of sacral region: Secondary | ICD-10-CM | POA: Diagnosis not present

## 2020-01-13 DIAGNOSIS — M47812 Spondylosis without myelopathy or radiculopathy, cervical region: Secondary | ICD-10-CM | POA: Diagnosis not present

## 2020-01-13 DIAGNOSIS — S338XXA Sprain of other parts of lumbar spine and pelvis, initial encounter: Secondary | ICD-10-CM | POA: Diagnosis not present

## 2020-01-13 DIAGNOSIS — M5136 Other intervertebral disc degeneration, lumbar region: Secondary | ICD-10-CM | POA: Diagnosis not present

## 2020-01-13 DIAGNOSIS — M9902 Segmental and somatic dysfunction of thoracic region: Secondary | ICD-10-CM | POA: Diagnosis not present

## 2020-01-13 DIAGNOSIS — M9903 Segmental and somatic dysfunction of lumbar region: Secondary | ICD-10-CM | POA: Diagnosis not present

## 2020-01-13 DIAGNOSIS — S29012A Strain of muscle and tendon of back wall of thorax, initial encounter: Secondary | ICD-10-CM | POA: Diagnosis not present

## 2020-01-13 DIAGNOSIS — M4802 Spinal stenosis, cervical region: Secondary | ICD-10-CM | POA: Diagnosis not present

## 2020-01-14 DIAGNOSIS — Z96651 Presence of right artificial knee joint: Secondary | ICD-10-CM | POA: Diagnosis not present

## 2020-01-14 DIAGNOSIS — M25561 Pain in right knee: Secondary | ICD-10-CM | POA: Diagnosis not present

## 2020-01-14 DIAGNOSIS — M25661 Stiffness of right knee, not elsewhere classified: Secondary | ICD-10-CM | POA: Diagnosis not present

## 2020-01-19 DIAGNOSIS — M25661 Stiffness of right knee, not elsewhere classified: Secondary | ICD-10-CM | POA: Diagnosis not present

## 2020-01-19 DIAGNOSIS — M25561 Pain in right knee: Secondary | ICD-10-CM | POA: Diagnosis not present

## 2020-01-19 DIAGNOSIS — Z96651 Presence of right artificial knee joint: Secondary | ICD-10-CM | POA: Diagnosis not present

## 2020-01-20 DIAGNOSIS — M5136 Other intervertebral disc degeneration, lumbar region: Secondary | ICD-10-CM | POA: Diagnosis not present

## 2020-01-20 DIAGNOSIS — M9902 Segmental and somatic dysfunction of thoracic region: Secondary | ICD-10-CM | POA: Diagnosis not present

## 2020-01-20 DIAGNOSIS — S29012A Strain of muscle and tendon of back wall of thorax, initial encounter: Secondary | ICD-10-CM | POA: Diagnosis not present

## 2020-01-20 DIAGNOSIS — M9901 Segmental and somatic dysfunction of cervical region: Secondary | ICD-10-CM | POA: Diagnosis not present

## 2020-01-20 DIAGNOSIS — M9903 Segmental and somatic dysfunction of lumbar region: Secondary | ICD-10-CM | POA: Diagnosis not present

## 2020-01-20 DIAGNOSIS — M9904 Segmental and somatic dysfunction of sacral region: Secondary | ICD-10-CM | POA: Diagnosis not present

## 2020-01-20 DIAGNOSIS — M4802 Spinal stenosis, cervical region: Secondary | ICD-10-CM | POA: Diagnosis not present

## 2020-01-20 DIAGNOSIS — M47812 Spondylosis without myelopathy or radiculopathy, cervical region: Secondary | ICD-10-CM | POA: Diagnosis not present

## 2020-01-20 DIAGNOSIS — S338XXA Sprain of other parts of lumbar spine and pelvis, initial encounter: Secondary | ICD-10-CM | POA: Diagnosis not present

## 2020-01-21 ENCOUNTER — Encounter: Payer: Self-pay | Admitting: Adult Health

## 2020-01-21 ENCOUNTER — Ambulatory Visit (INDEPENDENT_AMBULATORY_CARE_PROVIDER_SITE_OTHER): Payer: Medicare Other | Admitting: Adult Health

## 2020-01-21 DIAGNOSIS — F411 Generalized anxiety disorder: Secondary | ICD-10-CM | POA: Diagnosis not present

## 2020-01-21 DIAGNOSIS — G4730 Sleep apnea, unspecified: Secondary | ICD-10-CM | POA: Diagnosis not present

## 2020-01-21 DIAGNOSIS — G4700 Insomnia, unspecified: Secondary | ICD-10-CM

## 2020-01-21 DIAGNOSIS — F331 Major depressive disorder, recurrent, moderate: Secondary | ICD-10-CM | POA: Diagnosis not present

## 2020-01-21 MED ORDER — ARMODAFINIL 150 MG PO TABS: 150.0000 mg | ORAL_TABLET | Freq: Every day | ORAL | 2 refills | Status: DC

## 2020-01-21 MED ORDER — CLONAZEPAM 1 MG PO TABS: 1.0000 mg | ORAL_TABLET | Freq: Three times a day (TID) | ORAL | 2 refills | Status: DC | PRN
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
## 2020-01-22 ENCOUNTER — Telehealth: Payer: Self-pay | Admitting: Adult Health

## 2020-01-22 DIAGNOSIS — Z96651 Presence of right artificial knee joint: Secondary | ICD-10-CM | POA: Diagnosis not present

## 2020-01-22 DIAGNOSIS — M25561 Pain in right knee: Secondary | ICD-10-CM | POA: Diagnosis not present

## 2020-01-22 DIAGNOSIS — M25661 Stiffness of right knee, not elsewhere classified: Secondary | ICD-10-CM | POA: Diagnosis not present

## 2020-01-22 NOTE — Telephone Encounter (Signed)
Sliverscript Notice of Medicare RX Drug Coverage ARMODAFINIL Tab.  Effective 10/24/2019 to 01/21/2021

## 2020-01-24 ENCOUNTER — Other Ambulatory Visit: Payer: Self-pay | Admitting: Neurology

## 2020-01-25 NOTE — Telephone Encounter (Signed)
Noted  

## 2020-01-26 DIAGNOSIS — M25661 Stiffness of right knee, not elsewhere classified: Secondary | ICD-10-CM | POA: Diagnosis not present

## 2020-01-26 DIAGNOSIS — M25561 Pain in right knee: Secondary | ICD-10-CM | POA: Diagnosis not present

## 2020-01-26 DIAGNOSIS — Z96651 Presence of right artificial knee joint: Secondary | ICD-10-CM | POA: Diagnosis not present

## 2020-01-27 DIAGNOSIS — M47812 Spondylosis without myelopathy or radiculopathy, cervical region: Secondary | ICD-10-CM | POA: Diagnosis not present

## 2020-01-27 DIAGNOSIS — M4802 Spinal stenosis, cervical region: Secondary | ICD-10-CM | POA: Diagnosis not present

## 2020-01-27 DIAGNOSIS — M9903 Segmental and somatic dysfunction of lumbar region: Secondary | ICD-10-CM | POA: Diagnosis not present

## 2020-01-27 DIAGNOSIS — S29012A Strain of muscle and tendon of back wall of thorax, initial encounter: Secondary | ICD-10-CM | POA: Diagnosis not present

## 2020-01-27 DIAGNOSIS — M9901 Segmental and somatic dysfunction of cervical region: Secondary | ICD-10-CM | POA: Diagnosis not present

## 2020-01-27 DIAGNOSIS — M5136 Other intervertebral disc degeneration, lumbar region: Secondary | ICD-10-CM | POA: Diagnosis not present

## 2020-01-27 DIAGNOSIS — M9904 Segmental and somatic dysfunction of sacral region: Secondary | ICD-10-CM | POA: Diagnosis not present

## 2020-01-27 DIAGNOSIS — M9902 Segmental and somatic dysfunction of thoracic region: Secondary | ICD-10-CM | POA: Diagnosis not present

## 2020-01-27 DIAGNOSIS — S338XXA Sprain of other parts of lumbar spine and pelvis, initial encounter: Secondary | ICD-10-CM | POA: Diagnosis not present

## 2020-01-28 DIAGNOSIS — M25561 Pain in right knee: Secondary | ICD-10-CM | POA: Diagnosis not present

## 2020-01-28 DIAGNOSIS — Z96651 Presence of right artificial knee joint: Secondary | ICD-10-CM | POA: Diagnosis not present

## 2020-01-28 DIAGNOSIS — M25661 Stiffness of right knee, not elsewhere classified: Secondary | ICD-10-CM | POA: Diagnosis not present

## 2020-02-03 DIAGNOSIS — M9904 Segmental and somatic dysfunction of sacral region: Secondary | ICD-10-CM | POA: Diagnosis not present

## 2020-02-03 DIAGNOSIS — M9902 Segmental and somatic dysfunction of thoracic region: Secondary | ICD-10-CM | POA: Diagnosis not present

## 2020-02-03 DIAGNOSIS — M47812 Spondylosis without myelopathy or radiculopathy, cervical region: Secondary | ICD-10-CM | POA: Diagnosis not present

## 2020-02-03 DIAGNOSIS — M4802 Spinal stenosis, cervical region: Secondary | ICD-10-CM | POA: Diagnosis not present

## 2020-02-03 DIAGNOSIS — M5136 Other intervertebral disc degeneration, lumbar region: Secondary | ICD-10-CM | POA: Diagnosis not present

## 2020-02-03 DIAGNOSIS — S29012A Strain of muscle and tendon of back wall of thorax, initial encounter: Secondary | ICD-10-CM | POA: Diagnosis not present

## 2020-02-03 DIAGNOSIS — M9903 Segmental and somatic dysfunction of lumbar region: Secondary | ICD-10-CM | POA: Diagnosis not present

## 2020-02-03 DIAGNOSIS — S338XXA Sprain of other parts of lumbar spine and pelvis, initial encounter: Secondary | ICD-10-CM | POA: Diagnosis not present

## 2020-02-03 DIAGNOSIS — M9901 Segmental and somatic dysfunction of cervical region: Secondary | ICD-10-CM | POA: Diagnosis not present

## 2020-02-04 DIAGNOSIS — M5136 Other intervertebral disc degeneration, lumbar region: Secondary | ICD-10-CM | POA: Diagnosis not present

## 2020-02-04 DIAGNOSIS — M1711 Unilateral primary osteoarthritis, right knee: Secondary | ICD-10-CM | POA: Diagnosis not present

## 2020-02-04 DIAGNOSIS — M542 Cervicalgia: Secondary | ICD-10-CM | POA: Diagnosis not present

## 2020-02-04 DIAGNOSIS — G8929 Other chronic pain: Secondary | ICD-10-CM | POA: Diagnosis not present

## 2020-02-05 DIAGNOSIS — M25661 Stiffness of right knee, not elsewhere classified: Secondary | ICD-10-CM | POA: Diagnosis not present

## 2020-02-05 DIAGNOSIS — M25561 Pain in right knee: Secondary | ICD-10-CM | POA: Diagnosis not present

## 2020-02-05 DIAGNOSIS — Z96651 Presence of right artificial knee joint: Secondary | ICD-10-CM | POA: Diagnosis not present

## 2020-02-08 DIAGNOSIS — Z0000 Encounter for general adult medical examination without abnormal findings: Secondary | ICD-10-CM | POA: Diagnosis not present

## 2020-02-08 DIAGNOSIS — L304 Erythema intertrigo: Secondary | ICD-10-CM | POA: Diagnosis not present

## 2020-02-08 DIAGNOSIS — E7800 Pure hypercholesterolemia, unspecified: Secondary | ICD-10-CM | POA: Diagnosis not present

## 2020-02-08 DIAGNOSIS — F324 Major depressive disorder, single episode, in partial remission: Secondary | ICD-10-CM | POA: Diagnosis not present

## 2020-02-08 DIAGNOSIS — K219 Gastro-esophageal reflux disease without esophagitis: Secondary | ICD-10-CM | POA: Diagnosis not present

## 2020-02-08 DIAGNOSIS — F419 Anxiety disorder, unspecified: Secondary | ICD-10-CM | POA: Diagnosis not present

## 2020-02-08 DIAGNOSIS — Z1389 Encounter for screening for other disorder: Secondary | ICD-10-CM | POA: Diagnosis not present

## 2020-02-08 DIAGNOSIS — I10 Essential (primary) hypertension: Secondary | ICD-10-CM | POA: Diagnosis not present

## 2020-02-08 DIAGNOSIS — R5383 Other fatigue: Secondary | ICD-10-CM | POA: Diagnosis not present

## 2020-02-08 DIAGNOSIS — E039 Hypothyroidism, unspecified: Secondary | ICD-10-CM | POA: Diagnosis not present

## 2020-02-09 DIAGNOSIS — Z96651 Presence of right artificial knee joint: Secondary | ICD-10-CM | POA: Diagnosis not present

## 2020-02-09 DIAGNOSIS — M25661 Stiffness of right knee, not elsewhere classified: Secondary | ICD-10-CM | POA: Diagnosis not present

## 2020-02-09 DIAGNOSIS — M25561 Pain in right knee: Secondary | ICD-10-CM | POA: Diagnosis not present

## 2020-02-10 ENCOUNTER — Other Ambulatory Visit: Payer: Self-pay

## 2020-02-10 DIAGNOSIS — M9902 Segmental and somatic dysfunction of thoracic region: Secondary | ICD-10-CM | POA: Diagnosis not present

## 2020-02-10 DIAGNOSIS — M4802 Spinal stenosis, cervical region: Secondary | ICD-10-CM | POA: Diagnosis not present

## 2020-02-10 DIAGNOSIS — M9904 Segmental and somatic dysfunction of sacral region: Secondary | ICD-10-CM | POA: Diagnosis not present

## 2020-02-10 DIAGNOSIS — S29012A Strain of muscle and tendon of back wall of thorax, initial encounter: Secondary | ICD-10-CM | POA: Diagnosis not present

## 2020-02-10 DIAGNOSIS — G4700 Insomnia, unspecified: Secondary | ICD-10-CM

## 2020-02-10 DIAGNOSIS — M9903 Segmental and somatic dysfunction of lumbar region: Secondary | ICD-10-CM | POA: Diagnosis not present

## 2020-02-10 DIAGNOSIS — M5136 Other intervertebral disc degeneration, lumbar region: Secondary | ICD-10-CM | POA: Diagnosis not present

## 2020-02-10 DIAGNOSIS — S338XXA Sprain of other parts of lumbar spine and pelvis, initial encounter: Secondary | ICD-10-CM | POA: Diagnosis not present

## 2020-02-10 DIAGNOSIS — M9901 Segmental and somatic dysfunction of cervical region: Secondary | ICD-10-CM | POA: Diagnosis not present

## 2020-02-10 DIAGNOSIS — M47812 Spondylosis without myelopathy or radiculopathy, cervical region: Secondary | ICD-10-CM | POA: Diagnosis not present

## 2020-02-10 MED ORDER — CLONAZEPAM 1 MG PO TABS: 1.0000 mg | ORAL_TABLET | Freq: Three times a day (TID) | ORAL | 2 refills | Status: DC | PRN

## 2020-02-11 DIAGNOSIS — M25661 Stiffness of right knee, not elsewhere classified: Secondary | ICD-10-CM | POA: Diagnosis not present

## 2020-02-11 DIAGNOSIS — Z96651 Presence of right artificial knee joint: Secondary | ICD-10-CM | POA: Diagnosis not present

## 2020-02-11 DIAGNOSIS — M25561 Pain in right knee: Secondary | ICD-10-CM | POA: Diagnosis not present

## 2020-02-15 DIAGNOSIS — Z96651 Presence of right artificial knee joint: Secondary | ICD-10-CM | POA: Diagnosis not present

## 2020-02-15 DIAGNOSIS — M25661 Stiffness of right knee, not elsewhere classified: Secondary | ICD-10-CM | POA: Diagnosis not present

## 2020-02-15 DIAGNOSIS — M25561 Pain in right knee: Secondary | ICD-10-CM | POA: Diagnosis not present

## 2020-02-17 DIAGNOSIS — S29012A Strain of muscle and tendon of back wall of thorax, initial encounter: Secondary | ICD-10-CM | POA: Diagnosis not present

## 2020-02-17 DIAGNOSIS — M47812 Spondylosis without myelopathy or radiculopathy, cervical region: Secondary | ICD-10-CM | POA: Diagnosis not present

## 2020-02-17 DIAGNOSIS — M5136 Other intervertebral disc degeneration, lumbar region: Secondary | ICD-10-CM | POA: Diagnosis not present

## 2020-02-17 DIAGNOSIS — M4802 Spinal stenosis, cervical region: Secondary | ICD-10-CM | POA: Diagnosis not present

## 2020-02-17 DIAGNOSIS — M9903 Segmental and somatic dysfunction of lumbar region: Secondary | ICD-10-CM | POA: Diagnosis not present

## 2020-02-17 DIAGNOSIS — S338XXA Sprain of other parts of lumbar spine and pelvis, initial encounter: Secondary | ICD-10-CM | POA: Diagnosis not present

## 2020-02-17 DIAGNOSIS — M9902 Segmental and somatic dysfunction of thoracic region: Secondary | ICD-10-CM | POA: Diagnosis not present

## 2020-02-17 DIAGNOSIS — M9904 Segmental and somatic dysfunction of sacral region: Secondary | ICD-10-CM | POA: Diagnosis not present

## 2020-02-17 DIAGNOSIS — M9901 Segmental and somatic dysfunction of cervical region: Secondary | ICD-10-CM | POA: Diagnosis not present

## 2020-02-18 DIAGNOSIS — M25661 Stiffness of right knee, not elsewhere classified: Secondary | ICD-10-CM | POA: Diagnosis not present

## 2020-02-18 DIAGNOSIS — M25561 Pain in right knee: Secondary | ICD-10-CM | POA: Diagnosis not present

## 2020-02-18 DIAGNOSIS — Z96651 Presence of right artificial knee joint: Secondary | ICD-10-CM | POA: Diagnosis not present

## 2020-02-23 DIAGNOSIS — M25561 Pain in right knee: Secondary | ICD-10-CM | POA: Diagnosis not present

## 2020-02-23 DIAGNOSIS — Z96651 Presence of right artificial knee joint: Secondary | ICD-10-CM | POA: Diagnosis not present

## 2020-02-23 DIAGNOSIS — M25661 Stiffness of right knee, not elsewhere classified: Secondary | ICD-10-CM | POA: Diagnosis not present

## 2020-02-24 DIAGNOSIS — S338XXA Sprain of other parts of lumbar spine and pelvis, initial encounter: Secondary | ICD-10-CM | POA: Diagnosis not present

## 2020-02-24 DIAGNOSIS — M47812 Spondylosis without myelopathy or radiculopathy, cervical region: Secondary | ICD-10-CM | POA: Diagnosis not present

## 2020-02-24 DIAGNOSIS — M9904 Segmental and somatic dysfunction of sacral region: Secondary | ICD-10-CM | POA: Diagnosis not present

## 2020-02-24 DIAGNOSIS — M9902 Segmental and somatic dysfunction of thoracic region: Secondary | ICD-10-CM | POA: Diagnosis not present

## 2020-02-24 DIAGNOSIS — S29012A Strain of muscle and tendon of back wall of thorax, initial encounter: Secondary | ICD-10-CM | POA: Diagnosis not present

## 2020-02-24 DIAGNOSIS — M9901 Segmental and somatic dysfunction of cervical region: Secondary | ICD-10-CM | POA: Diagnosis not present

## 2020-02-24 DIAGNOSIS — M4802 Spinal stenosis, cervical region: Secondary | ICD-10-CM | POA: Diagnosis not present

## 2020-02-24 DIAGNOSIS — M9903 Segmental and somatic dysfunction of lumbar region: Secondary | ICD-10-CM | POA: Diagnosis not present

## 2020-02-24 DIAGNOSIS — M5136 Other intervertebral disc degeneration, lumbar region: Secondary | ICD-10-CM | POA: Diagnosis not present

## 2020-02-25 DIAGNOSIS — Z96651 Presence of right artificial knee joint: Secondary | ICD-10-CM | POA: Diagnosis not present

## 2020-02-25 DIAGNOSIS — M25561 Pain in right knee: Secondary | ICD-10-CM | POA: Diagnosis not present

## 2020-02-25 DIAGNOSIS — M25661 Stiffness of right knee, not elsewhere classified: Secondary | ICD-10-CM | POA: Diagnosis not present

## 2020-02-28 IMAGING — MR MR CERVICAL SPINE W/O CM
4 of 5 series · 28 of 48 positions shown · non-contrast
Comparison: None available.

CLINICAL DATA: Initial evaluation for right neck and shoulder pain
for 6 months.

EXAM:
MRI CERVICAL SPINE WITHOUT CONTRAST
TECHNIQUE: Multiplanar, multisequence MR imaging of the cervical spine was
performed. No intravenous contrast was administered.

[Series 3: tir sag · sagittal · 3.0mm · 0.41mm/px · 6 of 13 slices shown]
[im 1/13]
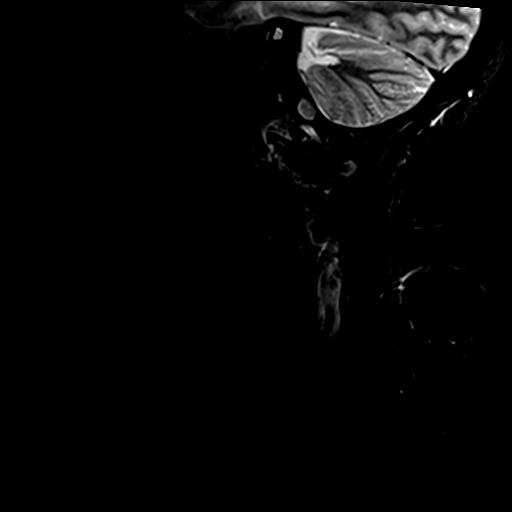
[im 3/13]
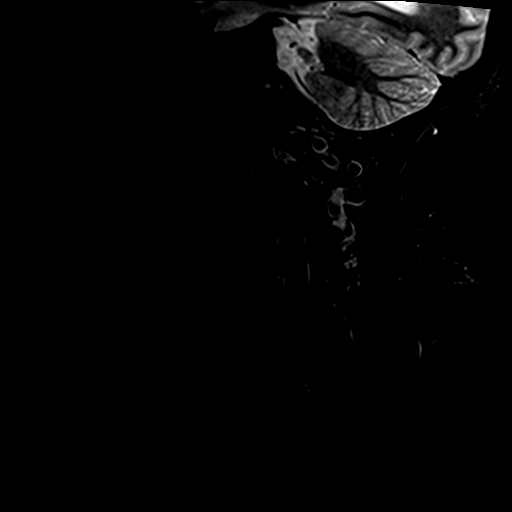
[im 5/13]
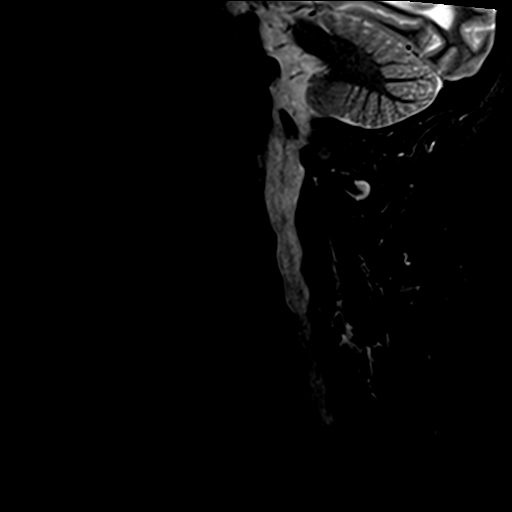
[im 8/13]
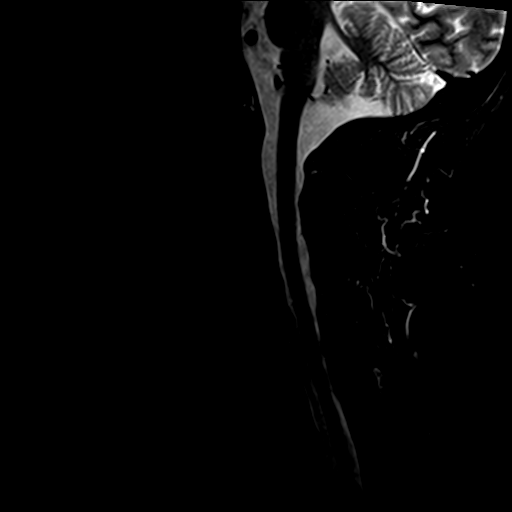
[im 10/13]
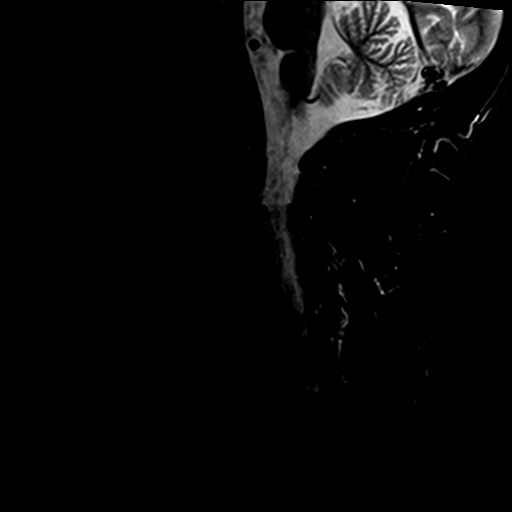
[im 13/13]
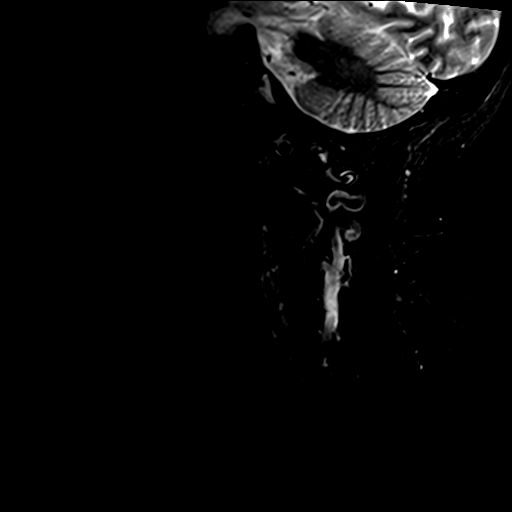

[Series 4: T1 · sagittal · 3.0mm · 0.41mm/px · 7 of 13 slices shown]
[im 1/13]
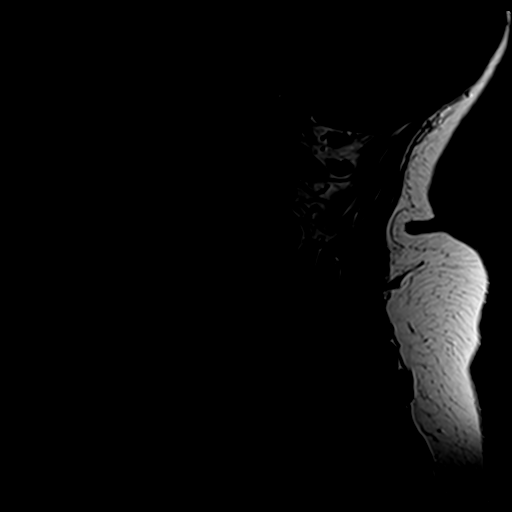
[im 3/13]
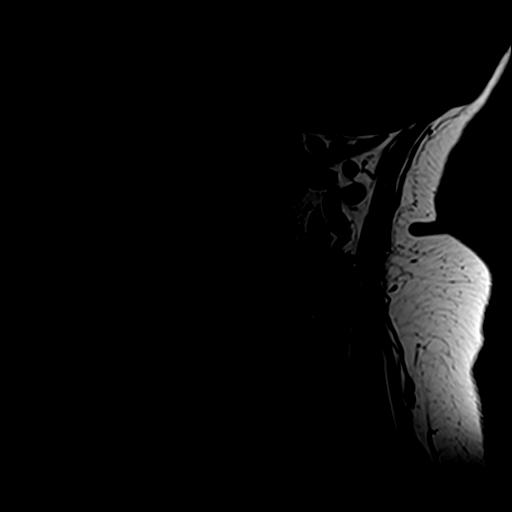
[im 5/13]
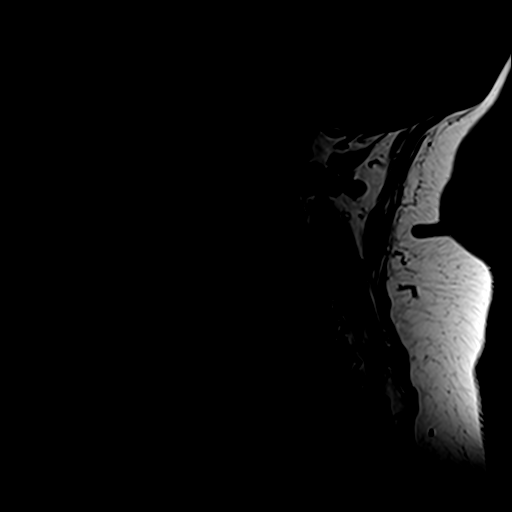
[im 7/13]
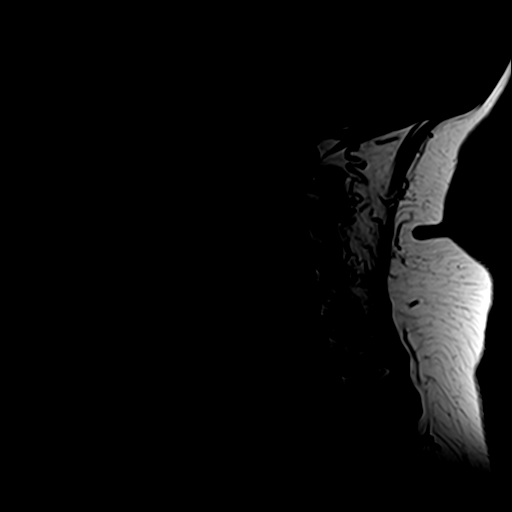
[im 9/13]
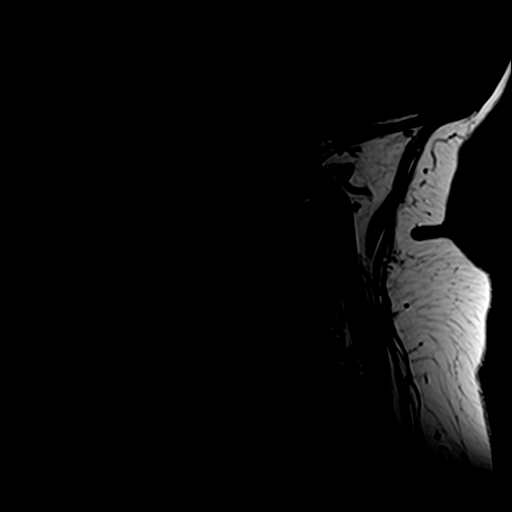
[im 11/13]
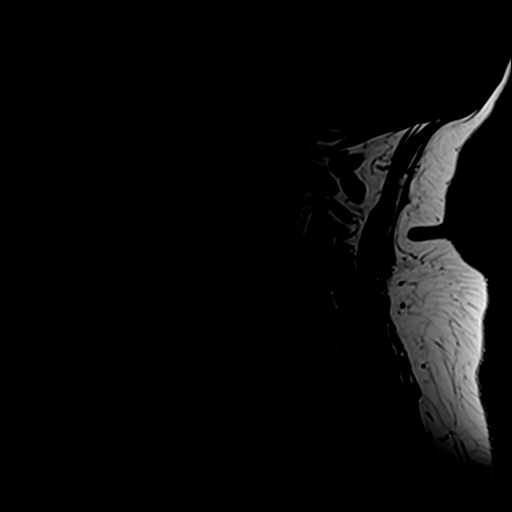
[im 13/13]
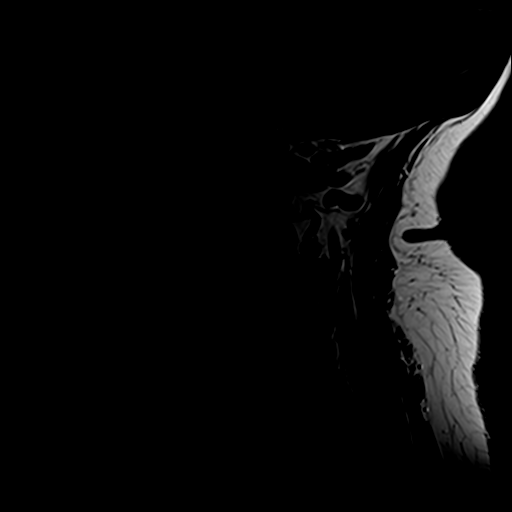

[Series 5: T2 · sagittal · 3.0mm · 0.66mm/px · 7 of 13 slices shown (1 of 2)]
[im 1/13]
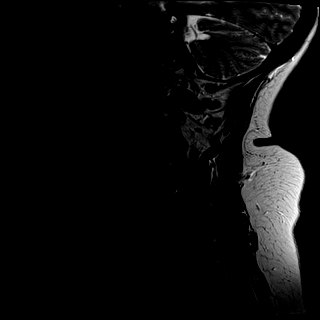
[im 3/13]
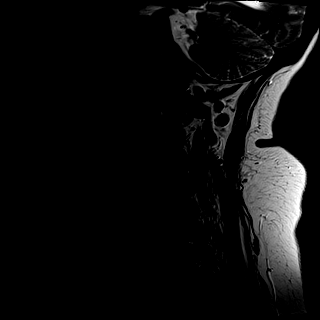
[im 5/13]
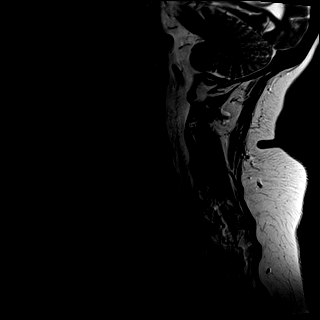
[im 7/13]
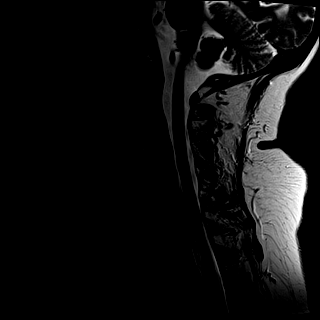
[im 9/13]
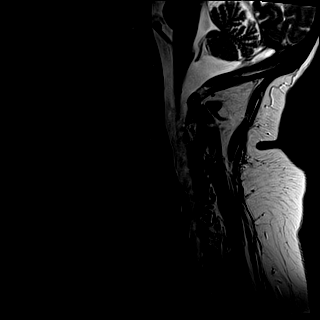
[im 11/13]
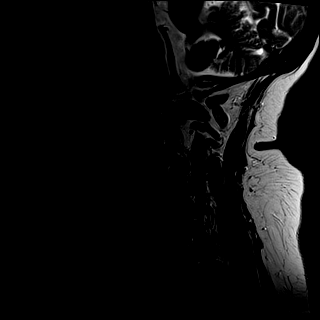
[im 13/13]
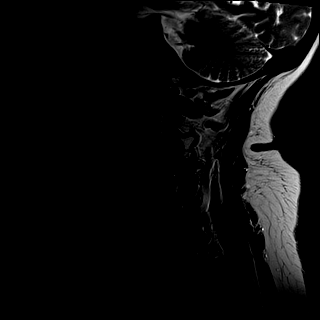

[Series 7: T2 · axial · 3.0mm · 0.70mm/px · z∈[-44,+54]mm · 8 of 28 slices shown (2 of 2)]
[im 1/28]
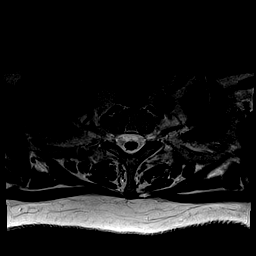
[im 5/28]
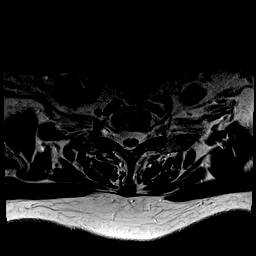
[im 9/28]
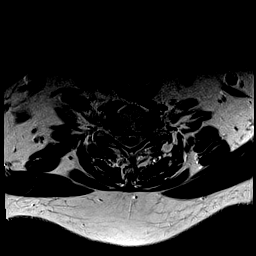
[im 13/28]
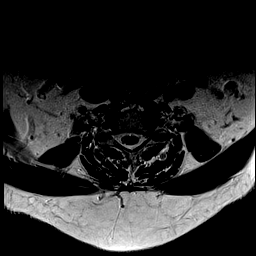
[im 15/28]
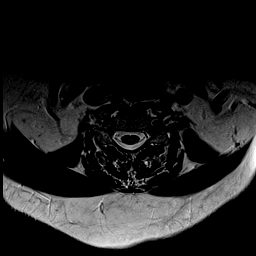
[im 19/28]
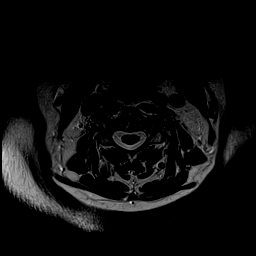
[im 23/28]
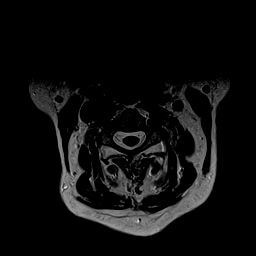
[im 28/28]
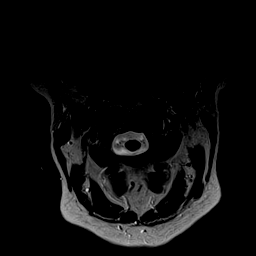

[28 of 48 positions shown; findings below may reference images not displayed]

FINDINGS: Alignment: Straightening of the normal cervical lordosis. Trace
anterolisthesis of C2 on C3, chronic and facet mediated. No other
listhesis or malalignment.

Vertebrae: Vertebral body height maintained without evidence for
acute or chronic fracture. Bone marrow signal intensity within
normal limits. No discrete or worrisome osseous lesions. Mild
reactive endplate changes present about the C6-7 interspace. No
other abnormal marrow edema.

Cord: Signal intensity within the cervical spinal cord is normal.

Posterior Fossa, vertebral arteries, paraspinal tissues: Visualized
brain and posterior fossa within normal limits. Craniocervical
junction normal. Paraspinous and prevertebral soft tissues within
normal limits. Normal intravascular flow voids seen within the
vertebral arteries bilaterally.

Disc levels:

C2-C3: Mild disc bulge with endplate spurring. Mild left-sided facet
hypertrophy. No significant canal or foraminal stenosis.

C3-C4: Mild annular disc bulge with uncovertebral hypertrophy. No
significant canal or foraminal stenosis.

C4-C5: Mild disc bulge with uncovertebral hypertrophy. No
significant canal or foraminal stenosis.

C5-C6: Chronic intervertebral disc space narrowing with diffuse
circumferential disc osteophyte. Broad posterior component flattens
and partially effaces the ventral thecal sac. Mild spinal stenosis
without cord deformity. Moderate bilateral C6 foraminal stenosis.

C6-C7: Chronic intervertebral disc space narrowing with diffuse
degenerative disc osteophyte. Broad posterior component flattens and
partially effaces the ventral thecal sac. Mild spinal stenosis
without cord impingement. Moderate bilateral C7 foraminal stenosis,
left slightly worse than right.

C7-T1:  Minimal facet hypertrophy.  No stenosis.

Visualized upper thoracic spine demonstrates minimal disc bulging at
T1-2 and T2-3 without significant stenosis.
IMPRESSION: 1. Degenerative disc osteophyte at C5-6 and C6-7 with resultant mild
canal, with moderate bilateral C6 and C7 foraminal stenosis. Either
of the C6 or C7 nerve roots could be affected.
2. Additional minimal noncompressive disc bulging elsewhere within
the cervical spine without significant stenosis or impingement.

## 2020-03-01 DIAGNOSIS — M25661 Stiffness of right knee, not elsewhere classified: Secondary | ICD-10-CM | POA: Diagnosis not present

## 2020-03-01 DIAGNOSIS — M25561 Pain in right knee: Secondary | ICD-10-CM | POA: Diagnosis not present

## 2020-03-01 DIAGNOSIS — Z96651 Presence of right artificial knee joint: Secondary | ICD-10-CM | POA: Diagnosis not present

## 2020-03-02 DIAGNOSIS — M4802 Spinal stenosis, cervical region: Secondary | ICD-10-CM | POA: Diagnosis not present

## 2020-03-02 DIAGNOSIS — S29012A Strain of muscle and tendon of back wall of thorax, initial encounter: Secondary | ICD-10-CM | POA: Diagnosis not present

## 2020-03-02 DIAGNOSIS — M9902 Segmental and somatic dysfunction of thoracic region: Secondary | ICD-10-CM | POA: Diagnosis not present

## 2020-03-02 DIAGNOSIS — M9901 Segmental and somatic dysfunction of cervical region: Secondary | ICD-10-CM | POA: Diagnosis not present

## 2020-03-02 DIAGNOSIS — M9904 Segmental and somatic dysfunction of sacral region: Secondary | ICD-10-CM | POA: Diagnosis not present

## 2020-03-02 DIAGNOSIS — M47812 Spondylosis without myelopathy or radiculopathy, cervical region: Secondary | ICD-10-CM | POA: Diagnosis not present

## 2020-03-02 DIAGNOSIS — M5136 Other intervertebral disc degeneration, lumbar region: Secondary | ICD-10-CM | POA: Diagnosis not present

## 2020-03-02 DIAGNOSIS — M9903 Segmental and somatic dysfunction of lumbar region: Secondary | ICD-10-CM | POA: Diagnosis not present

## 2020-03-02 DIAGNOSIS — S338XXA Sprain of other parts of lumbar spine and pelvis, initial encounter: Secondary | ICD-10-CM | POA: Diagnosis not present

## 2020-03-03 DIAGNOSIS — M5136 Other intervertebral disc degeneration, lumbar region: Secondary | ICD-10-CM | POA: Diagnosis not present

## 2020-03-03 DIAGNOSIS — Z79899 Other long term (current) drug therapy: Secondary | ICD-10-CM | POA: Diagnosis not present

## 2020-03-03 DIAGNOSIS — Z79891 Long term (current) use of opiate analgesic: Secondary | ICD-10-CM | POA: Diagnosis not present

## 2020-03-03 DIAGNOSIS — G894 Chronic pain syndrome: Secondary | ICD-10-CM | POA: Diagnosis not present

## 2020-03-03 DIAGNOSIS — M542 Cervicalgia: Secondary | ICD-10-CM | POA: Diagnosis not present

## 2020-03-03 DIAGNOSIS — G8929 Other chronic pain: Secondary | ICD-10-CM | POA: Diagnosis not present

## 2020-03-03 DIAGNOSIS — M1711 Unilateral primary osteoarthritis, right knee: Secondary | ICD-10-CM | POA: Diagnosis not present

## 2020-03-10 DIAGNOSIS — M25651 Stiffness of right hip, not elsewhere classified: Secondary | ICD-10-CM | POA: Diagnosis not present

## 2020-03-10 DIAGNOSIS — M25661 Stiffness of right knee, not elsewhere classified: Secondary | ICD-10-CM | POA: Diagnosis not present

## 2020-03-10 DIAGNOSIS — Z96651 Presence of right artificial knee joint: Secondary | ICD-10-CM | POA: Diagnosis not present

## 2020-03-14 DIAGNOSIS — Z96651 Presence of right artificial knee joint: Secondary | ICD-10-CM | POA: Diagnosis not present

## 2020-03-14 DIAGNOSIS — M25661 Stiffness of right knee, not elsewhere classified: Secondary | ICD-10-CM | POA: Diagnosis not present

## 2020-03-14 DIAGNOSIS — M25561 Pain in right knee: Secondary | ICD-10-CM | POA: Diagnosis not present

## 2020-03-16 ENCOUNTER — Ambulatory Visit: Payer: Medicare Other | Admitting: Adult Health

## 2020-03-17 DIAGNOSIS — M25661 Stiffness of right knee, not elsewhere classified: Secondary | ICD-10-CM | POA: Diagnosis not present

## 2020-03-17 DIAGNOSIS — M25561 Pain in right knee: Secondary | ICD-10-CM | POA: Diagnosis not present

## 2020-03-17 DIAGNOSIS — Z96651 Presence of right artificial knee joint: Secondary | ICD-10-CM | POA: Diagnosis not present

## 2020-03-22 ENCOUNTER — Ambulatory Visit: Payer: Medicare Other | Admitting: Adult Health

## 2020-04-06 ENCOUNTER — Telehealth: Payer: Self-pay | Admitting: Adult Health

## 2020-04-06 NOTE — Telephone Encounter (Signed)
Olivia [name] called to make her follow up appt after missing her last scheduled appt.  She is already behind in her follow up.  She has had a lot going on and just couldn't make last appt.  Wanting refill of clonazepam but there is already one at the pharmacy.  Did want you to know she wasn't skipping out on her appts.

## 2020-04-07 NOTE — Telephone Encounter (Signed)
Noted  

## 2020-04-20 ENCOUNTER — Ambulatory Visit (INDEPENDENT_AMBULATORY_CARE_PROVIDER_SITE_OTHER): Payer: Medicare Other | Admitting: Adult Health

## 2020-04-20 ENCOUNTER — Other Ambulatory Visit: Payer: Self-pay

## 2020-04-20 ENCOUNTER — Encounter: Payer: Self-pay | Admitting: Adult Health

## 2020-04-20 DIAGNOSIS — M47812 Spondylosis without myelopathy or radiculopathy, cervical region: Secondary | ICD-10-CM | POA: Diagnosis not present

## 2020-04-20 DIAGNOSIS — F411 Generalized anxiety disorder: Secondary | ICD-10-CM

## 2020-04-20 DIAGNOSIS — S338XXA Sprain of other parts of lumbar spine and pelvis, initial encounter: Secondary | ICD-10-CM | POA: Diagnosis not present

## 2020-04-20 DIAGNOSIS — F331 Major depressive disorder, recurrent, moderate: Secondary | ICD-10-CM | POA: Diagnosis not present

## 2020-04-20 DIAGNOSIS — M9901 Segmental and somatic dysfunction of cervical region: Secondary | ICD-10-CM | POA: Diagnosis not present

## 2020-04-20 DIAGNOSIS — M9902 Segmental and somatic dysfunction of thoracic region: Secondary | ICD-10-CM | POA: Diagnosis not present

## 2020-04-20 DIAGNOSIS — M9904 Segmental and somatic dysfunction of sacral region: Secondary | ICD-10-CM | POA: Diagnosis not present

## 2020-04-20 DIAGNOSIS — M5136 Other intervertebral disc degeneration, lumbar region: Secondary | ICD-10-CM | POA: Diagnosis not present

## 2020-04-20 DIAGNOSIS — G4730 Sleep apnea, unspecified: Secondary | ICD-10-CM

## 2020-04-20 DIAGNOSIS — G4700 Insomnia, unspecified: Secondary | ICD-10-CM

## 2020-04-20 DIAGNOSIS — M9903 Segmental and somatic dysfunction of lumbar region: Secondary | ICD-10-CM | POA: Diagnosis not present

## 2020-04-20 DIAGNOSIS — S29012A Strain of muscle and tendon of back wall of thorax, initial encounter: Secondary | ICD-10-CM | POA: Diagnosis not present

## 2020-04-20 DIAGNOSIS — M4802 Spinal stenosis, cervical region: Secondary | ICD-10-CM | POA: Diagnosis not present
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
## 2020-04-22 ENCOUNTER — Other Ambulatory Visit: Payer: Self-pay

## 2020-04-22 DIAGNOSIS — F411 Generalized anxiety disorder: Secondary | ICD-10-CM

## 2020-04-22 DIAGNOSIS — F331 Major depressive disorder, recurrent, moderate: Secondary | ICD-10-CM

## 2020-04-22 MED ORDER — FLUOXETINE HCL 40 MG PO CAPS: 40.0000 mg | ORAL_CAPSULE | Freq: Every day | ORAL | 5 refills | Status: DC

## 2020-05-07 ENCOUNTER — Other Ambulatory Visit: Payer: Self-pay | Admitting: Adult Health

## 2020-05-07 DIAGNOSIS — G4700 Insomnia, unspecified: Secondary | ICD-10-CM

## 2020-05-09 DIAGNOSIS — M9902 Segmental and somatic dysfunction of thoracic region: Secondary | ICD-10-CM | POA: Diagnosis not present

## 2020-05-09 DIAGNOSIS — M9903 Segmental and somatic dysfunction of lumbar region: Secondary | ICD-10-CM | POA: Diagnosis not present

## 2020-05-09 DIAGNOSIS — M5136 Other intervertebral disc degeneration, lumbar region: Secondary | ICD-10-CM | POA: Diagnosis not present

## 2020-05-09 DIAGNOSIS — M9904 Segmental and somatic dysfunction of sacral region: Secondary | ICD-10-CM | POA: Diagnosis not present

## 2020-05-09 DIAGNOSIS — S29012A Strain of muscle and tendon of back wall of thorax, initial encounter: Secondary | ICD-10-CM | POA: Diagnosis not present

## 2020-05-09 DIAGNOSIS — S338XXA Sprain of other parts of lumbar spine and pelvis, initial encounter: Secondary | ICD-10-CM | POA: Diagnosis not present

## 2020-05-09 DIAGNOSIS — M47812 Spondylosis without myelopathy or radiculopathy, cervical region: Secondary | ICD-10-CM | POA: Diagnosis not present

## 2020-05-09 DIAGNOSIS — M9901 Segmental and somatic dysfunction of cervical region: Secondary | ICD-10-CM | POA: Diagnosis not present

## 2020-05-09 DIAGNOSIS — M4802 Spinal stenosis, cervical region: Secondary | ICD-10-CM | POA: Diagnosis not present

## 2020-05-09 NOTE — Telephone Encounter (Signed)
Last apt 07/21, due back 4 weeks.

## 2020-05-16 DIAGNOSIS — M1711 Unilateral primary osteoarthritis, right knee: Secondary | ICD-10-CM | POA: Diagnosis not present

## 2020-05-16 DIAGNOSIS — G894 Chronic pain syndrome: Secondary | ICD-10-CM | POA: Diagnosis not present

## 2020-05-16 DIAGNOSIS — M5136 Other intervertebral disc degeneration, lumbar region: Secondary | ICD-10-CM | POA: Diagnosis not present

## 2020-05-16 DIAGNOSIS — M5030 Other cervical disc degeneration, unspecified cervical region: Secondary | ICD-10-CM | POA: Diagnosis not present

## 2020-05-17 DIAGNOSIS — G4733 Obstructive sleep apnea (adult) (pediatric): Secondary | ICD-10-CM | POA: Diagnosis not present

## 2020-05-26 DIAGNOSIS — M9901 Segmental and somatic dysfunction of cervical region: Secondary | ICD-10-CM | POA: Diagnosis not present

## 2020-05-26 DIAGNOSIS — M5136 Other intervertebral disc degeneration, lumbar region: Secondary | ICD-10-CM | POA: Diagnosis not present

## 2020-05-26 DIAGNOSIS — M47812 Spondylosis without myelopathy or radiculopathy, cervical region: Secondary | ICD-10-CM | POA: Diagnosis not present

## 2020-05-26 DIAGNOSIS — M4802 Spinal stenosis, cervical region: Secondary | ICD-10-CM | POA: Diagnosis not present

## 2020-05-26 DIAGNOSIS — M9903 Segmental and somatic dysfunction of lumbar region: Secondary | ICD-10-CM | POA: Diagnosis not present

## 2020-05-26 DIAGNOSIS — S29012A Strain of muscle and tendon of back wall of thorax, initial encounter: Secondary | ICD-10-CM | POA: Diagnosis not present

## 2020-05-26 DIAGNOSIS — M9904 Segmental and somatic dysfunction of sacral region: Secondary | ICD-10-CM | POA: Diagnosis not present

## 2020-05-26 DIAGNOSIS — M9902 Segmental and somatic dysfunction of thoracic region: Secondary | ICD-10-CM | POA: Diagnosis not present

## 2020-05-26 DIAGNOSIS — S338XXA Sprain of other parts of lumbar spine and pelvis, initial encounter: Secondary | ICD-10-CM | POA: Diagnosis not present

## 2020-06-03 ENCOUNTER — Other Ambulatory Visit: Payer: Self-pay | Admitting: Adult Health

## 2020-06-03 DIAGNOSIS — G4700 Insomnia, unspecified: Secondary | ICD-10-CM

## 2020-06-03 NOTE — Telephone Encounter (Signed)
Script sent  

## 2020-06-03 NOTE — Telephone Encounter (Signed)
Patient needs to schedule 4 week follow up also

## 2020-06-08 DIAGNOSIS — M9901 Segmental and somatic dysfunction of cervical region: Secondary | ICD-10-CM | POA: Diagnosis not present

## 2020-06-08 DIAGNOSIS — M9904 Segmental and somatic dysfunction of sacral region: Secondary | ICD-10-CM | POA: Diagnosis not present

## 2020-06-08 DIAGNOSIS — M5136 Other intervertebral disc degeneration, lumbar region: Secondary | ICD-10-CM | POA: Diagnosis not present

## 2020-06-08 DIAGNOSIS — S29012A Strain of muscle and tendon of back wall of thorax, initial encounter: Secondary | ICD-10-CM | POA: Diagnosis not present

## 2020-06-08 DIAGNOSIS — M9902 Segmental and somatic dysfunction of thoracic region: Secondary | ICD-10-CM | POA: Diagnosis not present

## 2020-06-08 DIAGNOSIS — M47812 Spondylosis without myelopathy or radiculopathy, cervical region: Secondary | ICD-10-CM | POA: Diagnosis not present

## 2020-06-08 DIAGNOSIS — S338XXA Sprain of other parts of lumbar spine and pelvis, initial encounter: Secondary | ICD-10-CM | POA: Diagnosis not present

## 2020-06-08 DIAGNOSIS — M9903 Segmental and somatic dysfunction of lumbar region: Secondary | ICD-10-CM | POA: Diagnosis not present

## 2020-06-08 DIAGNOSIS — M4802 Spinal stenosis, cervical region: Secondary | ICD-10-CM | POA: Diagnosis not present

## 2020-06-09 DIAGNOSIS — Z96651 Presence of right artificial knee joint: Secondary | ICD-10-CM | POA: Diagnosis not present

## 2020-06-14 DIAGNOSIS — M6281 Muscle weakness (generalized): Secondary | ICD-10-CM | POA: Diagnosis not present

## 2020-06-16 DIAGNOSIS — Z79891 Long term (current) use of opiate analgesic: Secondary | ICD-10-CM | POA: Diagnosis not present

## 2020-06-16 DIAGNOSIS — G894 Chronic pain syndrome: Secondary | ICD-10-CM | POA: Diagnosis not present

## 2020-06-16 DIAGNOSIS — Z79899 Other long term (current) drug therapy: Secondary | ICD-10-CM | POA: Diagnosis not present

## 2020-06-16 DIAGNOSIS — G8929 Other chronic pain: Secondary | ICD-10-CM | POA: Diagnosis not present

## 2020-06-16 DIAGNOSIS — M542 Cervicalgia: Secondary | ICD-10-CM | POA: Diagnosis not present

## 2020-06-25 ENCOUNTER — Other Ambulatory Visit: Payer: Self-pay | Admitting: Adult Health

## 2020-06-27 DIAGNOSIS — M9902 Segmental and somatic dysfunction of thoracic region: Secondary | ICD-10-CM | POA: Diagnosis not present

## 2020-06-27 DIAGNOSIS — M9901 Segmental and somatic dysfunction of cervical region: Secondary | ICD-10-CM | POA: Diagnosis not present

## 2020-06-27 DIAGNOSIS — S338XXA Sprain of other parts of lumbar spine and pelvis, initial encounter: Secondary | ICD-10-CM | POA: Diagnosis not present

## 2020-06-27 DIAGNOSIS — M4802 Spinal stenosis, cervical region: Secondary | ICD-10-CM | POA: Diagnosis not present

## 2020-06-27 DIAGNOSIS — M9903 Segmental and somatic dysfunction of lumbar region: Secondary | ICD-10-CM | POA: Diagnosis not present

## 2020-06-27 DIAGNOSIS — S29012A Strain of muscle and tendon of back wall of thorax, initial encounter: Secondary | ICD-10-CM | POA: Diagnosis not present

## 2020-06-27 DIAGNOSIS — M9904 Segmental and somatic dysfunction of sacral region: Secondary | ICD-10-CM | POA: Diagnosis not present

## 2020-06-27 DIAGNOSIS — M47812 Spondylosis without myelopathy or radiculopathy, cervical region: Secondary | ICD-10-CM | POA: Diagnosis not present

## 2020-06-27 DIAGNOSIS — M5136 Other intervertebral disc degeneration, lumbar region: Secondary | ICD-10-CM | POA: Diagnosis not present

## 2020-06-28 DIAGNOSIS — M6281 Muscle weakness (generalized): Secondary | ICD-10-CM | POA: Diagnosis not present

## 2020-06-29 ENCOUNTER — Other Ambulatory Visit: Payer: Self-pay | Admitting: Adult Health

## 2020-06-29 DIAGNOSIS — M9903 Segmental and somatic dysfunction of lumbar region: Secondary | ICD-10-CM | POA: Diagnosis not present

## 2020-06-29 DIAGNOSIS — M9901 Segmental and somatic dysfunction of cervical region: Secondary | ICD-10-CM | POA: Diagnosis not present

## 2020-06-29 DIAGNOSIS — M5136 Other intervertebral disc degeneration, lumbar region: Secondary | ICD-10-CM | POA: Diagnosis not present

## 2020-06-29 DIAGNOSIS — M9904 Segmental and somatic dysfunction of sacral region: Secondary | ICD-10-CM | POA: Diagnosis not present

## 2020-06-29 DIAGNOSIS — S29012A Strain of muscle and tendon of back wall of thorax, initial encounter: Secondary | ICD-10-CM | POA: Diagnosis not present

## 2020-06-29 DIAGNOSIS — S338XXA Sprain of other parts of lumbar spine and pelvis, initial encounter: Secondary | ICD-10-CM | POA: Diagnosis not present

## 2020-06-29 DIAGNOSIS — M4802 Spinal stenosis, cervical region: Secondary | ICD-10-CM | POA: Diagnosis not present

## 2020-06-29 DIAGNOSIS — G4700 Insomnia, unspecified: Secondary | ICD-10-CM

## 2020-06-29 DIAGNOSIS — M47812 Spondylosis without myelopathy or radiculopathy, cervical region: Secondary | ICD-10-CM | POA: Diagnosis not present

## 2020-06-29 DIAGNOSIS — M9902 Segmental and somatic dysfunction of thoracic region: Secondary | ICD-10-CM | POA: Diagnosis not present

## 2020-06-30 DIAGNOSIS — M6281 Muscle weakness (generalized): Secondary | ICD-10-CM | POA: Diagnosis not present

## 2020-07-04 DIAGNOSIS — M47812 Spondylosis without myelopathy or radiculopathy, cervical region: Secondary | ICD-10-CM | POA: Diagnosis not present

## 2020-07-04 DIAGNOSIS — M5136 Other intervertebral disc degeneration, lumbar region: Secondary | ICD-10-CM | POA: Diagnosis not present

## 2020-07-04 DIAGNOSIS — M9903 Segmental and somatic dysfunction of lumbar region: Secondary | ICD-10-CM | POA: Diagnosis not present

## 2020-07-04 DIAGNOSIS — M4802 Spinal stenosis, cervical region: Secondary | ICD-10-CM | POA: Diagnosis not present

## 2020-07-04 DIAGNOSIS — M9904 Segmental and somatic dysfunction of sacral region: Secondary | ICD-10-CM | POA: Diagnosis not present

## 2020-07-04 DIAGNOSIS — S29012A Strain of muscle and tendon of back wall of thorax, initial encounter: Secondary | ICD-10-CM | POA: Diagnosis not present

## 2020-07-04 DIAGNOSIS — M9902 Segmental and somatic dysfunction of thoracic region: Secondary | ICD-10-CM | POA: Diagnosis not present

## 2020-07-04 DIAGNOSIS — M9901 Segmental and somatic dysfunction of cervical region: Secondary | ICD-10-CM | POA: Diagnosis not present

## 2020-07-04 DIAGNOSIS — S338XXA Sprain of other parts of lumbar spine and pelvis, initial encounter: Secondary | ICD-10-CM | POA: Diagnosis not present

## 2020-07-05 DIAGNOSIS — M6281 Muscle weakness (generalized): Secondary | ICD-10-CM | POA: Diagnosis not present

## 2020-07-07 DIAGNOSIS — M6281 Muscle weakness (generalized): Secondary | ICD-10-CM | POA: Diagnosis not present

## 2020-07-13 ENCOUNTER — Other Ambulatory Visit: Payer: Self-pay | Admitting: Family Medicine

## 2020-07-13 DIAGNOSIS — S29012A Strain of muscle and tendon of back wall of thorax, initial encounter: Secondary | ICD-10-CM | POA: Diagnosis not present

## 2020-07-13 DIAGNOSIS — M5136 Other intervertebral disc degeneration, lumbar region: Secondary | ICD-10-CM | POA: Diagnosis not present

## 2020-07-13 DIAGNOSIS — S338XXA Sprain of other parts of lumbar spine and pelvis, initial encounter: Secondary | ICD-10-CM | POA: Diagnosis not present

## 2020-07-13 DIAGNOSIS — M47812 Spondylosis without myelopathy or radiculopathy, cervical region: Secondary | ICD-10-CM | POA: Diagnosis not present

## 2020-07-13 DIAGNOSIS — Z1231 Encounter for screening mammogram for malignant neoplasm of breast: Secondary | ICD-10-CM

## 2020-07-13 DIAGNOSIS — M9904 Segmental and somatic dysfunction of sacral region: Secondary | ICD-10-CM | POA: Diagnosis not present

## 2020-07-13 DIAGNOSIS — M9901 Segmental and somatic dysfunction of cervical region: Secondary | ICD-10-CM | POA: Diagnosis not present

## 2020-07-13 DIAGNOSIS — M9903 Segmental and somatic dysfunction of lumbar region: Secondary | ICD-10-CM | POA: Diagnosis not present

## 2020-07-13 DIAGNOSIS — M9902 Segmental and somatic dysfunction of thoracic region: Secondary | ICD-10-CM | POA: Diagnosis not present

## 2020-07-13 DIAGNOSIS — M4802 Spinal stenosis, cervical region: Secondary | ICD-10-CM | POA: Diagnosis not present

## 2020-07-26 DIAGNOSIS — G629 Polyneuropathy, unspecified: Secondary | ICD-10-CM | POA: Diagnosis not present

## 2020-07-26 DIAGNOSIS — M542 Cervicalgia: Secondary | ICD-10-CM | POA: Diagnosis not present

## 2020-07-26 DIAGNOSIS — M47817 Spondylosis without myelopathy or radiculopathy, lumbosacral region: Secondary | ICD-10-CM | POA: Diagnosis not present

## 2020-07-26 DIAGNOSIS — G8929 Other chronic pain: Secondary | ICD-10-CM | POA: Diagnosis not present

## 2020-08-01 DIAGNOSIS — M9904 Segmental and somatic dysfunction of sacral region: Secondary | ICD-10-CM | POA: Diagnosis not present

## 2020-08-01 DIAGNOSIS — S29012A Strain of muscle and tendon of back wall of thorax, initial encounter: Secondary | ICD-10-CM | POA: Diagnosis not present

## 2020-08-01 DIAGNOSIS — M9903 Segmental and somatic dysfunction of lumbar region: Secondary | ICD-10-CM | POA: Diagnosis not present

## 2020-08-01 DIAGNOSIS — M4802 Spinal stenosis, cervical region: Secondary | ICD-10-CM | POA: Diagnosis not present

## 2020-08-01 DIAGNOSIS — M47812 Spondylosis without myelopathy or radiculopathy, cervical region: Secondary | ICD-10-CM | POA: Diagnosis not present

## 2020-08-01 DIAGNOSIS — M5136 Other intervertebral disc degeneration, lumbar region: Secondary | ICD-10-CM | POA: Diagnosis not present

## 2020-08-01 DIAGNOSIS — M9901 Segmental and somatic dysfunction of cervical region: Secondary | ICD-10-CM | POA: Diagnosis not present

## 2020-08-01 DIAGNOSIS — M9902 Segmental and somatic dysfunction of thoracic region: Secondary | ICD-10-CM | POA: Diagnosis not present

## 2020-08-01 DIAGNOSIS — S338XXA Sprain of other parts of lumbar spine and pelvis, initial encounter: Secondary | ICD-10-CM | POA: Diagnosis not present

## 2020-08-08 DIAGNOSIS — M47812 Spondylosis without myelopathy or radiculopathy, cervical region: Secondary | ICD-10-CM | POA: Diagnosis not present

## 2020-08-08 DIAGNOSIS — M9904 Segmental and somatic dysfunction of sacral region: Secondary | ICD-10-CM | POA: Diagnosis not present

## 2020-08-08 DIAGNOSIS — M9902 Segmental and somatic dysfunction of thoracic region: Secondary | ICD-10-CM | POA: Diagnosis not present

## 2020-08-08 DIAGNOSIS — M9903 Segmental and somatic dysfunction of lumbar region: Secondary | ICD-10-CM | POA: Diagnosis not present

## 2020-08-08 DIAGNOSIS — S338XXA Sprain of other parts of lumbar spine and pelvis, initial encounter: Secondary | ICD-10-CM | POA: Diagnosis not present

## 2020-08-08 DIAGNOSIS — M4802 Spinal stenosis, cervical region: Secondary | ICD-10-CM | POA: Diagnosis not present

## 2020-08-08 DIAGNOSIS — M5136 Other intervertebral disc degeneration, lumbar region: Secondary | ICD-10-CM | POA: Diagnosis not present

## 2020-08-08 DIAGNOSIS — S29012A Strain of muscle and tendon of back wall of thorax, initial encounter: Secondary | ICD-10-CM | POA: Diagnosis not present

## 2020-08-08 DIAGNOSIS — M9901 Segmental and somatic dysfunction of cervical region: Secondary | ICD-10-CM | POA: Diagnosis not present

## 2020-08-09 ENCOUNTER — Ambulatory Visit
Admission: RE | Admit: 2020-08-09 | Discharge: 2020-08-09 | Disposition: A | Discharge status: Home / Self Care | Payer: Medicare HMO | Source: Ambulatory Visit | Attending: Family Medicine | Admitting: Family Medicine

## 2020-08-09 ENCOUNTER — Other Ambulatory Visit: Payer: Self-pay

## 2020-08-09 DIAGNOSIS — Z1231 Encounter for screening mammogram for malignant neoplasm of breast: Secondary | ICD-10-CM

## 2020-08-10 DIAGNOSIS — K219 Gastro-esophageal reflux disease without esophagitis: Secondary | ICD-10-CM | POA: Diagnosis not present

## 2020-08-10 DIAGNOSIS — I10 Essential (primary) hypertension: Secondary | ICD-10-CM | POA: Diagnosis not present

## 2020-08-10 DIAGNOSIS — Z23 Encounter for immunization: Secondary | ICD-10-CM | POA: Diagnosis not present

## 2020-08-10 DIAGNOSIS — Z79899 Other long term (current) drug therapy: Secondary | ICD-10-CM | POA: Diagnosis not present

## 2020-08-10 DIAGNOSIS — K146 Glossodynia: Secondary | ICD-10-CM | POA: Diagnosis not present

## 2020-08-10 DIAGNOSIS — E039 Hypothyroidism, unspecified: Secondary | ICD-10-CM | POA: Diagnosis not present

## 2020-08-10 DIAGNOSIS — G629 Polyneuropathy, unspecified: Secondary | ICD-10-CM | POA: Diagnosis not present

## 2020-08-10 DIAGNOSIS — F419 Anxiety disorder, unspecified: Secondary | ICD-10-CM | POA: Diagnosis not present

## 2020-08-10 DIAGNOSIS — I679 Cerebrovascular disease, unspecified: Secondary | ICD-10-CM | POA: Diagnosis not present

## 2020-08-22 ENCOUNTER — Telehealth: Payer: Self-pay

## 2020-08-22 NOTE — Telephone Encounter (Signed)
Prior authorization submitted and approved for ARMODAFINIL 150 MG effective 08/01/2020-09/30/2021 with [employer] [id number]

## 2020-08-30 DIAGNOSIS — M9904 Segmental and somatic dysfunction of sacral region: Secondary | ICD-10-CM | POA: Diagnosis not present

## 2020-08-30 DIAGNOSIS — S338XXA Sprain of other parts of lumbar spine and pelvis, initial encounter: Secondary | ICD-10-CM | POA: Diagnosis not present

## 2020-08-30 DIAGNOSIS — M4802 Spinal stenosis, cervical region: Secondary | ICD-10-CM | POA: Diagnosis not present

## 2020-08-30 DIAGNOSIS — M9901 Segmental and somatic dysfunction of cervical region: Secondary | ICD-10-CM | POA: Diagnosis not present

## 2020-08-30 DIAGNOSIS — S29012A Strain of muscle and tendon of back wall of thorax, initial encounter: Secondary | ICD-10-CM | POA: Diagnosis not present

## 2020-08-30 DIAGNOSIS — M47812 Spondylosis without myelopathy or radiculopathy, cervical region: Secondary | ICD-10-CM | POA: Diagnosis not present

## 2020-08-30 DIAGNOSIS — M9903 Segmental and somatic dysfunction of lumbar region: Secondary | ICD-10-CM | POA: Diagnosis not present

## 2020-08-30 DIAGNOSIS — M9902 Segmental and somatic dysfunction of thoracic region: Secondary | ICD-10-CM | POA: Diagnosis not present

## 2020-08-30 DIAGNOSIS — M5136 Other intervertebral disc degeneration, lumbar region: Secondary | ICD-10-CM | POA: Diagnosis not present

## 2020-09-07 DIAGNOSIS — S338XXA Sprain of other parts of lumbar spine and pelvis, initial encounter: Secondary | ICD-10-CM | POA: Diagnosis not present

## 2020-09-07 DIAGNOSIS — M5136 Other intervertebral disc degeneration, lumbar region: Secondary | ICD-10-CM | POA: Diagnosis not present

## 2020-09-07 DIAGNOSIS — M9904 Segmental and somatic dysfunction of sacral region: Secondary | ICD-10-CM | POA: Diagnosis not present

## 2020-09-07 DIAGNOSIS — M9901 Segmental and somatic dysfunction of cervical region: Secondary | ICD-10-CM | POA: Diagnosis not present

## 2020-09-07 DIAGNOSIS — M47812 Spondylosis without myelopathy or radiculopathy, cervical region: Secondary | ICD-10-CM | POA: Diagnosis not present

## 2020-09-07 DIAGNOSIS — S29012A Strain of muscle and tendon of back wall of thorax, initial encounter: Secondary | ICD-10-CM | POA: Diagnosis not present

## 2020-09-07 DIAGNOSIS — M4802 Spinal stenosis, cervical region: Secondary | ICD-10-CM | POA: Diagnosis not present

## 2020-09-07 DIAGNOSIS — M9903 Segmental and somatic dysfunction of lumbar region: Secondary | ICD-10-CM | POA: Diagnosis not present

## 2020-09-07 DIAGNOSIS — M9902 Segmental and somatic dysfunction of thoracic region: Secondary | ICD-10-CM | POA: Diagnosis not present

## 2020-09-08 DIAGNOSIS — M6281 Muscle weakness (generalized): Secondary | ICD-10-CM | POA: Diagnosis not present

## 2020-09-12 DIAGNOSIS — Z79891 Long term (current) use of opiate analgesic: Secondary | ICD-10-CM | POA: Diagnosis not present

## 2020-09-12 DIAGNOSIS — M5136 Other intervertebral disc degeneration, lumbar region: Secondary | ICD-10-CM | POA: Diagnosis not present

## 2020-09-12 DIAGNOSIS — M47817 Spondylosis without myelopathy or radiculopathy, lumbosacral region: Secondary | ICD-10-CM | POA: Diagnosis not present

## 2020-09-12 DIAGNOSIS — Z79899 Other long term (current) drug therapy: Secondary | ICD-10-CM | POA: Diagnosis not present

## 2020-09-12 DIAGNOSIS — G894 Chronic pain syndrome: Secondary | ICD-10-CM | POA: Diagnosis not present

## 2020-09-14 DIAGNOSIS — M5136 Other intervertebral disc degeneration, lumbar region: Secondary | ICD-10-CM | POA: Diagnosis not present

## 2020-09-14 DIAGNOSIS — M9901 Segmental and somatic dysfunction of cervical region: Secondary | ICD-10-CM | POA: Diagnosis not present

## 2020-09-14 DIAGNOSIS — M47812 Spondylosis without myelopathy or radiculopathy, cervical region: Secondary | ICD-10-CM | POA: Diagnosis not present

## 2020-09-14 DIAGNOSIS — M9902 Segmental and somatic dysfunction of thoracic region: Secondary | ICD-10-CM | POA: Diagnosis not present

## 2020-09-14 DIAGNOSIS — M6281 Muscle weakness (generalized): Secondary | ICD-10-CM | POA: Diagnosis not present

## 2020-09-14 DIAGNOSIS — M4802 Spinal stenosis, cervical region: Secondary | ICD-10-CM | POA: Diagnosis not present

## 2020-09-14 DIAGNOSIS — S29012A Strain of muscle and tendon of back wall of thorax, initial encounter: Secondary | ICD-10-CM | POA: Diagnosis not present

## 2020-09-14 DIAGNOSIS — M9903 Segmental and somatic dysfunction of lumbar region: Secondary | ICD-10-CM | POA: Diagnosis not present

## 2020-09-14 DIAGNOSIS — M9904 Segmental and somatic dysfunction of sacral region: Secondary | ICD-10-CM | POA: Diagnosis not present

## 2020-09-14 DIAGNOSIS — S338XXA Sprain of other parts of lumbar spine and pelvis, initial encounter: Secondary | ICD-10-CM | POA: Diagnosis not present

## 2020-09-18 ENCOUNTER — Other Ambulatory Visit: Payer: Self-pay | Admitting: Adult Health

## 2020-09-19 ENCOUNTER — Other Ambulatory Visit: Payer: Self-pay | Admitting: Adult Health

## 2020-09-19 DIAGNOSIS — G4700 Insomnia, unspecified: Secondary | ICD-10-CM

## 2020-09-21 ENCOUNTER — Other Ambulatory Visit: Payer: Self-pay | Admitting: Adult Health

## 2020-09-21 DIAGNOSIS — F411 Generalized anxiety disorder: Secondary | ICD-10-CM

## 2020-09-21 DIAGNOSIS — F331 Major depressive disorder, recurrent, moderate: Secondary | ICD-10-CM

## 2020-09-22 DIAGNOSIS — M6281 Muscle weakness (generalized): Secondary | ICD-10-CM | POA: Diagnosis not present

## 2020-09-26 DIAGNOSIS — M47812 Spondylosis without myelopathy or radiculopathy, cervical region: Secondary | ICD-10-CM | POA: Diagnosis not present

## 2020-09-26 DIAGNOSIS — M4802 Spinal stenosis, cervical region: Secondary | ICD-10-CM | POA: Diagnosis not present

## 2020-09-26 DIAGNOSIS — M9904 Segmental and somatic dysfunction of sacral region: Secondary | ICD-10-CM | POA: Diagnosis not present

## 2020-09-26 DIAGNOSIS — S29012A Strain of muscle and tendon of back wall of thorax, initial encounter: Secondary | ICD-10-CM | POA: Diagnosis not present

## 2020-09-26 DIAGNOSIS — M9901 Segmental and somatic dysfunction of cervical region: Secondary | ICD-10-CM | POA: Diagnosis not present

## 2020-09-26 DIAGNOSIS — M9902 Segmental and somatic dysfunction of thoracic region: Secondary | ICD-10-CM | POA: Diagnosis not present

## 2020-09-26 DIAGNOSIS — M5136 Other intervertebral disc degeneration, lumbar region: Secondary | ICD-10-CM | POA: Diagnosis not present

## 2020-09-26 DIAGNOSIS — S338XXA Sprain of other parts of lumbar spine and pelvis, initial encounter: Secondary | ICD-10-CM | POA: Diagnosis not present

## 2020-09-26 DIAGNOSIS — M9903 Segmental and somatic dysfunction of lumbar region: Secondary | ICD-10-CM | POA: Diagnosis not present

## 2020-09-27 DIAGNOSIS — M6281 Muscle weakness (generalized): Secondary | ICD-10-CM | POA: Diagnosis not present

## 2020-09-28 DIAGNOSIS — K219 Gastro-esophageal reflux disease without esophagitis: Secondary | ICD-10-CM | POA: Diagnosis not present

## 2020-09-28 DIAGNOSIS — R110 Nausea: Secondary | ICD-10-CM | POA: Diagnosis not present

## 2020-09-28 DIAGNOSIS — R1013 Epigastric pain: Secondary | ICD-10-CM | POA: Diagnosis not present

## 2020-10-04 DIAGNOSIS — M6281 Muscle weakness (generalized): Secondary | ICD-10-CM | POA: Diagnosis not present

## 2020-10-05 DIAGNOSIS — M4802 Spinal stenosis, cervical region: Secondary | ICD-10-CM | POA: Diagnosis not present

## 2020-10-05 DIAGNOSIS — S338XXA Sprain of other parts of lumbar spine and pelvis, initial encounter: Secondary | ICD-10-CM | POA: Diagnosis not present

## 2020-10-05 DIAGNOSIS — M5136 Other intervertebral disc degeneration, lumbar region: Secondary | ICD-10-CM | POA: Diagnosis not present

## 2020-10-05 DIAGNOSIS — M47812 Spondylosis without myelopathy or radiculopathy, cervical region: Secondary | ICD-10-CM | POA: Diagnosis not present

## 2020-10-05 DIAGNOSIS — M9904 Segmental and somatic dysfunction of sacral region: Secondary | ICD-10-CM | POA: Diagnosis not present

## 2020-10-05 DIAGNOSIS — M9903 Segmental and somatic dysfunction of lumbar region: Secondary | ICD-10-CM | POA: Diagnosis not present

## 2020-10-05 DIAGNOSIS — M9902 Segmental and somatic dysfunction of thoracic region: Secondary | ICD-10-CM | POA: Diagnosis not present

## 2020-10-05 DIAGNOSIS — S29012A Strain of muscle and tendon of back wall of thorax, initial encounter: Secondary | ICD-10-CM | POA: Diagnosis not present

## 2020-10-05 DIAGNOSIS — M9901 Segmental and somatic dysfunction of cervical region: Secondary | ICD-10-CM | POA: Diagnosis not present

## 2020-10-18 DIAGNOSIS — M47812 Spondylosis without myelopathy or radiculopathy, cervical region: Secondary | ICD-10-CM | POA: Diagnosis not present

## 2020-10-18 DIAGNOSIS — M4802 Spinal stenosis, cervical region: Secondary | ICD-10-CM | POA: Diagnosis not present

## 2020-10-18 DIAGNOSIS — S29012A Strain of muscle and tendon of back wall of thorax, initial encounter: Secondary | ICD-10-CM | POA: Diagnosis not present

## 2020-10-18 DIAGNOSIS — S338XXA Sprain of other parts of lumbar spine and pelvis, initial encounter: Secondary | ICD-10-CM | POA: Diagnosis not present

## 2020-10-18 DIAGNOSIS — M9904 Segmental and somatic dysfunction of sacral region: Secondary | ICD-10-CM | POA: Diagnosis not present

## 2020-10-18 DIAGNOSIS — M5136 Other intervertebral disc degeneration, lumbar region: Secondary | ICD-10-CM | POA: Diagnosis not present

## 2020-10-18 DIAGNOSIS — M9902 Segmental and somatic dysfunction of thoracic region: Secondary | ICD-10-CM | POA: Diagnosis not present

## 2020-10-18 DIAGNOSIS — M9903 Segmental and somatic dysfunction of lumbar region: Secondary | ICD-10-CM | POA: Diagnosis not present

## 2020-10-18 DIAGNOSIS — M9901 Segmental and somatic dysfunction of cervical region: Secondary | ICD-10-CM | POA: Diagnosis not present

## 2020-10-21 ENCOUNTER — Telehealth: Payer: Self-pay

## 2020-10-21 ENCOUNTER — Other Ambulatory Visit: Payer: Self-pay

## 2020-10-21 ENCOUNTER — Ambulatory Visit (INDEPENDENT_AMBULATORY_CARE_PROVIDER_SITE_OTHER): Payer: Medicare Other

## 2020-10-21 ENCOUNTER — Encounter: Payer: Self-pay | Admitting: Pulmonary Disease

## 2020-10-21 ENCOUNTER — Ambulatory Visit (INDEPENDENT_AMBULATORY_CARE_PROVIDER_SITE_OTHER): Payer: Medicare Other | Admitting: Pulmonary Disease

## 2020-10-21 VITALS — BP 138/72 | HR 95 | Temp 97.0°F | Ht 67.0 in | Wt 253.8 lb

## 2020-10-21 DIAGNOSIS — R059 Cough, unspecified: Secondary | ICD-10-CM

## 2020-10-21 MED ORDER — AMLODIPINE BESYLATE 5 MG PO TABS: 5.0000 mg | ORAL_TABLET | Freq: Every day | ORAL | 5 refills | Status: DC
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
## 2020-10-21 NOTE — Patient Instructions (Signed)
Stop the lisinopril and start Norvasc 5 mg daily for blood pressure Continue your antiacid regimen as before We will start Flonase, Astelin nasal spray.  He can use over-the-counter Zyrtec for postnasal drip  Schedule chest x-ray and PFTs  Follow-up in 1 to 2 months.

## 2020-10-21 NOTE — Telephone Encounter (Signed)
Prior Authorization submitted for Olivia [name] with [facility], approval received effective 10/01/2020-09/30/2021.  ID# [id number]

## 2020-10-24 ENCOUNTER — Telehealth: Payer: Self-pay | Admitting: Pulmonary Disease

## 2020-10-24 MED ORDER — AZELASTINE HCL 0.1 % NA SOLN: 2.0000 | Freq: Every day | NASAL | 3 refills | Status: DC | PRN

## 2020-10-24 NOTE — Telephone Encounter (Signed)
Patient would like to discuss results from chest xray last week. Concerned that it says possible PNA   Astelin nasal spray rx sent per request and office notes

## 2020-10-24 NOTE — Telephone Encounter (Signed)
I called and discussed with patients. She is not feeling well today with low grade fevers and increased cough.  I have advised her to get tested for COVID at home. If negative then we can consider antibiotics  She will call us back with the results.

## 2020-10-25 ENCOUNTER — Telehealth: Payer: Self-pay | Admitting: Pulmonary Disease

## 2020-10-25 ENCOUNTER — Encounter: Payer: Self-pay | Admitting: Pulmonary Disease

## 2020-10-25 MED ORDER — BENZONATATE 100 MG PO CAPS: 100.0000 mg | ORAL_CAPSULE | Freq: Two times a day (BID) | ORAL | 0 refills | Status: DC | PRN

## 2020-10-25 MED ORDER — AZITHROMYCIN 250 MG PO TABS: ORAL_TABLET | ORAL | 0 refills | Status: DC

## 2020-10-25 NOTE — Telephone Encounter (Signed)
Called let patient know Dr. Matilde Bash recommendations.  Has not had much luck with the tessalon, but will try them out Verified pharmacy  RX sent Nothing further needed at this time.

## 2020-10-25 NOTE — Telephone Encounter (Signed)
Marshell Garfinkel, MD     10/24/20 5:04 PM Note I called and discussed with patients. She is not feeling well today with low grade fevers and increased cough.  I have advised her to get tested for COVID at home. If negative then we can consider antibiotics  She will call us back with the results.       Patient test was negative and will need something also for a cough  Dr. Vaughan Browner please advise.

## 2020-10-25 NOTE — Telephone Encounter (Signed)
Please call in a Z-Pak  Tessalon 100 mg twice daily as needed for cough

## 2020-11-01 DIAGNOSIS — M6281 Muscle weakness (generalized): Secondary | ICD-10-CM | POA: Diagnosis not present

## 2020-11-03 DIAGNOSIS — Z01812 Encounter for preprocedural laboratory examination: Secondary | ICD-10-CM | POA: Diagnosis not present

## 2020-11-07 DIAGNOSIS — M9904 Segmental and somatic dysfunction of sacral region: Secondary | ICD-10-CM | POA: Diagnosis not present

## 2020-11-07 DIAGNOSIS — M9902 Segmental and somatic dysfunction of thoracic region: Secondary | ICD-10-CM | POA: Diagnosis not present

## 2020-11-07 DIAGNOSIS — M9901 Segmental and somatic dysfunction of cervical region: Secondary | ICD-10-CM | POA: Diagnosis not present

## 2020-11-07 DIAGNOSIS — M4802 Spinal stenosis, cervical region: Secondary | ICD-10-CM | POA: Diagnosis not present

## 2020-11-07 DIAGNOSIS — S338XXA Sprain of other parts of lumbar spine and pelvis, initial encounter: Secondary | ICD-10-CM | POA: Diagnosis not present

## 2020-11-07 DIAGNOSIS — S29012A Strain of muscle and tendon of back wall of thorax, initial encounter: Secondary | ICD-10-CM | POA: Diagnosis not present

## 2020-11-07 DIAGNOSIS — M9903 Segmental and somatic dysfunction of lumbar region: Secondary | ICD-10-CM | POA: Diagnosis not present

## 2020-11-07 DIAGNOSIS — M5136 Other intervertebral disc degeneration, lumbar region: Secondary | ICD-10-CM | POA: Diagnosis not present

## 2020-11-07 DIAGNOSIS — M47812 Spondylosis without myelopathy or radiculopathy, cervical region: Secondary | ICD-10-CM | POA: Diagnosis not present

## 2020-11-08 DIAGNOSIS — R1013 Epigastric pain: Secondary | ICD-10-CM | POA: Diagnosis not present

## 2020-11-08 DIAGNOSIS — K219 Gastro-esophageal reflux disease without esophagitis: Secondary | ICD-10-CM | POA: Diagnosis not present

## 2020-11-08 DIAGNOSIS — K317 Polyp of stomach and duodenum: Secondary | ICD-10-CM | POA: Diagnosis not present

## 2020-11-08 DIAGNOSIS — K2970 Gastritis, unspecified, without bleeding: Secondary | ICD-10-CM | POA: Diagnosis not present

## 2020-11-09 DIAGNOSIS — Z79891 Long term (current) use of opiate analgesic: Secondary | ICD-10-CM | POA: Diagnosis not present

## 2020-11-09 DIAGNOSIS — Z79899 Other long term (current) drug therapy: Secondary | ICD-10-CM | POA: Diagnosis not present

## 2020-11-09 DIAGNOSIS — M5030 Other cervical disc degeneration, unspecified cervical region: Secondary | ICD-10-CM | POA: Diagnosis not present

## 2020-11-09 DIAGNOSIS — G894 Chronic pain syndrome: Secondary | ICD-10-CM | POA: Diagnosis not present

## 2020-11-09 DIAGNOSIS — M47817 Spondylosis without myelopathy or radiculopathy, lumbosacral region: Secondary | ICD-10-CM | POA: Diagnosis not present

## 2020-11-09 DIAGNOSIS — M5136 Other intervertebral disc degeneration, lumbar region: Secondary | ICD-10-CM | POA: Diagnosis not present

## 2020-11-10 DIAGNOSIS — K317 Polyp of stomach and duodenum: Secondary | ICD-10-CM | POA: Diagnosis not present

## 2020-11-17 DIAGNOSIS — M5136 Other intervertebral disc degeneration, lumbar region: Secondary | ICD-10-CM | POA: Diagnosis not present

## 2020-11-17 DIAGNOSIS — M9904 Segmental and somatic dysfunction of sacral region: Secondary | ICD-10-CM | POA: Diagnosis not present

## 2020-11-17 DIAGNOSIS — S338XXA Sprain of other parts of lumbar spine and pelvis, initial encounter: Secondary | ICD-10-CM | POA: Diagnosis not present

## 2020-11-17 DIAGNOSIS — M9901 Segmental and somatic dysfunction of cervical region: Secondary | ICD-10-CM | POA: Diagnosis not present

## 2020-11-17 DIAGNOSIS — M9902 Segmental and somatic dysfunction of thoracic region: Secondary | ICD-10-CM | POA: Diagnosis not present

## 2020-11-17 DIAGNOSIS — M47812 Spondylosis without myelopathy or radiculopathy, cervical region: Secondary | ICD-10-CM | POA: Diagnosis not present

## 2020-11-17 DIAGNOSIS — M9903 Segmental and somatic dysfunction of lumbar region: Secondary | ICD-10-CM | POA: Diagnosis not present

## 2020-11-17 DIAGNOSIS — S29012A Strain of muscle and tendon of back wall of thorax, initial encounter: Secondary | ICD-10-CM | POA: Diagnosis not present

## 2020-11-17 DIAGNOSIS — M4802 Spinal stenosis, cervical region: Secondary | ICD-10-CM | POA: Diagnosis not present

## 2020-11-30 ENCOUNTER — Other Ambulatory Visit: Payer: Self-pay | Admitting: Physician Assistant

## 2020-11-30 DIAGNOSIS — R110 Nausea: Secondary | ICD-10-CM | POA: Diagnosis not present

## 2020-11-30 DIAGNOSIS — R198 Other specified symptoms and signs involving the digestive system and abdomen: Secondary | ICD-10-CM | POA: Diagnosis not present

## 2020-11-30 DIAGNOSIS — R1011 Right upper quadrant pain: Secondary | ICD-10-CM | POA: Diagnosis not present

## 2020-11-30 DIAGNOSIS — R1013 Epigastric pain: Secondary | ICD-10-CM | POA: Diagnosis not present

## 2020-11-30 DIAGNOSIS — R1010 Upper abdominal pain, unspecified: Secondary | ICD-10-CM

## 2020-12-05 ENCOUNTER — Other Ambulatory Visit: Payer: Self-pay | Admitting: Pulmonary Disease

## 2020-12-05 DIAGNOSIS — M9901 Segmental and somatic dysfunction of cervical region: Secondary | ICD-10-CM | POA: Diagnosis not present

## 2020-12-05 DIAGNOSIS — M9903 Segmental and somatic dysfunction of lumbar region: Secondary | ICD-10-CM | POA: Diagnosis not present

## 2020-12-05 DIAGNOSIS — S338XXA Sprain of other parts of lumbar spine and pelvis, initial encounter: Secondary | ICD-10-CM | POA: Diagnosis not present

## 2020-12-05 DIAGNOSIS — M9902 Segmental and somatic dysfunction of thoracic region: Secondary | ICD-10-CM | POA: Diagnosis not present

## 2020-12-05 DIAGNOSIS — M9904 Segmental and somatic dysfunction of sacral region: Secondary | ICD-10-CM | POA: Diagnosis not present

## 2020-12-05 DIAGNOSIS — M47812 Spondylosis without myelopathy or radiculopathy, cervical region: Secondary | ICD-10-CM | POA: Diagnosis not present

## 2020-12-05 DIAGNOSIS — S29012A Strain of muscle and tendon of back wall of thorax, initial encounter: Secondary | ICD-10-CM | POA: Diagnosis not present

## 2020-12-05 DIAGNOSIS — M4802 Spinal stenosis, cervical region: Secondary | ICD-10-CM | POA: Diagnosis not present

## 2020-12-05 DIAGNOSIS — M5136 Other intervertebral disc degeneration, lumbar region: Secondary | ICD-10-CM | POA: Diagnosis not present

## 2020-12-06 DIAGNOSIS — M6281 Muscle weakness (generalized): Secondary | ICD-10-CM | POA: Diagnosis not present

## 2020-12-09 DIAGNOSIS — M6281 Muscle weakness (generalized): Secondary | ICD-10-CM | POA: Diagnosis not present

## 2020-12-12 ENCOUNTER — Other Ambulatory Visit (HOSPITAL_COMMUNITY)
Admission: RE | Admit: 2020-12-12 | Discharge: 2020-12-12 | Disposition: A | Discharge status: Home / Self Care | Payer: Medicare Other | Source: Ambulatory Visit | Attending: Pulmonary Disease | Admitting: Pulmonary Disease

## 2020-12-12 DIAGNOSIS — Z20822 Contact with and (suspected) exposure to covid-19: Secondary | ICD-10-CM | POA: Insufficient documentation

## 2020-12-12 DIAGNOSIS — Z01812 Encounter for preprocedural laboratory examination: Secondary | ICD-10-CM | POA: Diagnosis not present

## 2020-12-13 ENCOUNTER — Other Ambulatory Visit: Payer: Self-pay | Admitting: Adult Health

## 2020-12-13 DIAGNOSIS — M6281 Muscle weakness (generalized): Secondary | ICD-10-CM | POA: Diagnosis not present

## 2020-12-13 LAB — SARS CORONAVIRUS 2 (TAT 6-24 HRS): SARS Coronavirus 2: NEGATIVE

## 2020-12-14 ENCOUNTER — Telehealth: Payer: Self-pay | Admitting: Pulmonary Disease

## 2020-12-14 NOTE — Telephone Encounter (Signed)
Lm for patient.  

## 2020-12-15 ENCOUNTER — Other Ambulatory Visit: Payer: Medicare Other

## 2020-12-15 ENCOUNTER — Ambulatory Visit: Payer: Medicare Other | Admitting: Pulmonary Disease

## 2020-12-15 ENCOUNTER — Other Ambulatory Visit: Payer: Self-pay

## 2020-12-15 DIAGNOSIS — G4700 Insomnia, unspecified: Secondary | ICD-10-CM

## 2020-12-15 MED ORDER — CLONAZEPAM 1 MG PO TABS: ORAL_TABLET | ORAL | 2 refills | Status: DC

## 2020-12-16 DIAGNOSIS — M6281 Muscle weakness (generalized): Secondary | ICD-10-CM | POA: Diagnosis not present

## 2020-12-16 NOTE — Telephone Encounter (Signed)
Patient rescheduled appt for 01/14/21 at 1015am with Dr. Vaughan Browner.

## 2020-12-19 DIAGNOSIS — M9902 Segmental and somatic dysfunction of thoracic region: Secondary | ICD-10-CM | POA: Diagnosis not present

## 2020-12-19 DIAGNOSIS — M47812 Spondylosis without myelopathy or radiculopathy, cervical region: Secondary | ICD-10-CM | POA: Diagnosis not present

## 2020-12-19 DIAGNOSIS — M9903 Segmental and somatic dysfunction of lumbar region: Secondary | ICD-10-CM | POA: Diagnosis not present

## 2020-12-19 DIAGNOSIS — M9904 Segmental and somatic dysfunction of sacral region: Secondary | ICD-10-CM | POA: Diagnosis not present

## 2020-12-19 DIAGNOSIS — M5136 Other intervertebral disc degeneration, lumbar region: Secondary | ICD-10-CM | POA: Diagnosis not present

## 2020-12-19 DIAGNOSIS — S338XXA Sprain of other parts of lumbar spine and pelvis, initial encounter: Secondary | ICD-10-CM | POA: Diagnosis not present

## 2020-12-19 DIAGNOSIS — S29012A Strain of muscle and tendon of back wall of thorax, initial encounter: Secondary | ICD-10-CM | POA: Diagnosis not present

## 2020-12-19 DIAGNOSIS — M4802 Spinal stenosis, cervical region: Secondary | ICD-10-CM | POA: Diagnosis not present

## 2020-12-19 DIAGNOSIS — M9901 Segmental and somatic dysfunction of cervical region: Secondary | ICD-10-CM | POA: Diagnosis not present

## 2020-12-20 DIAGNOSIS — M6281 Muscle weakness (generalized): Secondary | ICD-10-CM | POA: Diagnosis not present

## 2020-12-26 DIAGNOSIS — M1711 Unilateral primary osteoarthritis, right knee: Secondary | ICD-10-CM | POA: Diagnosis not present

## 2020-12-26 DIAGNOSIS — M5030 Other cervical disc degeneration, unspecified cervical region: Secondary | ICD-10-CM | POA: Diagnosis not present

## 2020-12-26 DIAGNOSIS — G894 Chronic pain syndrome: Secondary | ICD-10-CM | POA: Diagnosis not present

## 2020-12-26 DIAGNOSIS — M5136 Other intervertebral disc degeneration, lumbar region: Secondary | ICD-10-CM | POA: Diagnosis not present

## 2020-12-27 DIAGNOSIS — M6281 Muscle weakness (generalized): Secondary | ICD-10-CM | POA: Diagnosis not present

## 2020-12-29 DIAGNOSIS — M6281 Muscle weakness (generalized): Secondary | ICD-10-CM | POA: Diagnosis not present

## 2021-01-03 NOTE — Progress Notes (Signed)
@Patient  ID: Olivia [name], female    DOB: [birth year], 70 y.o.   MRN: [id number]  Chief Complaint  Patient presents with  . Follow-up    Congestion, cough, headache since Friday.     Referring provider: [name], MD  HPI: 70 year old female.  Past medical history significant for hypertension, sleep apnea, chronic cough.  Patient of Dr. Vaughan Browner, last seen in office 10/21/2020.  Patient has been evaluated by ENT in the past with no abnormalities.  During last visit lisinopril was stopped and changed to Norvasc 5 mg daily for blood pressure control.  She was also recommended to start Flonase, Astelin nasal spray and continue Zyrtec over-the-counter.  Ordered for chest x-ray and PFTs.  CXR 10/21/20 showed mild peribronchial thickening suggest bronchitis or early pneumonia. She was not feeling well on 10/24/20 with cough and low grade fever. Negative for covid. She was treat with zpack and tessalon perles. Patient had to cancel PFTs and OV in March d/t family emergency.   01/04/2021 - Interim  Patient presents today for 18-month follow-up. She had been doing well up until last week when she developed headache, sinus congestion and np cough 5-7 days ago. She does not feel well. Tessalon perles have really help her cough. She is taking Zyrtec and flonase as prescribed. She has also been taking benadryl and tylenol cold and sinus. She has been using a friends CPAP since her machine was recalled. No download available. She does not want to get covid test for PFTs. Denies f/c/s, chest tightness, wheezing or shortness of breath.    Allergies  Allergen Reactions  . Demerol [Meperidine] Nausea And Vomiting  . Penicillins Other (See Comments)    Unknown childhood allergy Has patient had a PCN reaction causing immediate rash, facial/tongue/throat swelling, SOB or lightheadedness with hypotension: Unknown Has patient had a PCN reaction causing severe rash involving mucus membranes or skin necrosis:  Unknown Has patient had a PCN reaction that required hospitalization: Unknown Has patient had a PCN reaction occurring within the last 10 years: No If all of the above answers are "NO", then may proceed with Cephalosporin use.     Immunization History  Administered Date(s) Administered  . Influenza, High Dose Seasonal PF 11/25/2017, 07/23/2018, 07/09/2019, 08/10/2020  . Pneumococcal Conjugate-13 01/22/2018  . Pneumococcal Polysaccharide-23 02/09/2019  . Tdap 06/02/2008, 02/09/2019  . Zoster 01/19/2011    Past Medical History:  Diagnosis Date  . Anxiety    [facility]  . Arthritis   . Cataract    THESE BEEN REPLACED  . Depression    [facility]  . Diverticulosis   . Dry eye syndrome   . GERD (gastroesophageal reflux disease)   . History of colon polyps 12/03/2012   COLONOSCOPY  . History of exercise stress test 2010   Dr. Ashok Norris   . Hyperlipidemia   . Hypertension   . Hypothyroidism   . Multinodular goiter    DR. KERR  . OSA (obstructive sleep apnea)    (PSG 12/12/15 ESS 2, AHI 42/HR REM 30/HR, O2 MIN 80%)  CPAP- q night , done with [facility].  [facility] done at [facility]  . Squamous cell carcinoma in situ 2015  . Thyroid disease    Nodules  . Tubular adenoma    DR. [place]  . Varicose veins     Tobacco History: Social History   Tobacco Use  Smoking Status Never Smoker  Smokeless Tobacco Never Used   Counseling given: Not Answered  Outpatient Medications Prior to Visit  Medication Sig Dispense Refill  . acetaminophen (TYLENOL) 500 MG tablet Take 2 tablets (1,000 mg total) by mouth every 6 (six) hours as needed for mild pain or moderate pain. 30 tablet 0  . amLODipine (NORVASC) 5 MG tablet Take 1 tablet (5 mg total) by mouth daily. 30 tablet 5  . Armodafinil 150 MG tablet TAKE 1 TABLET(150 MG) BY MOUTH DAILY 30 tablet 2  . atenolol (TENORMIN) 25 MG tablet Take 25 mg by mouth at bedtime.     Marland Kitchen CALCIUM-MAGNESIUM-VITAMIN D PO Take 1  tablet by mouth daily.    . clonazePAM (KLONOPIN) 1 MG tablet TAKE 1 TABLET(1 MG) BY MOUTH THREE TIMES DAILY AS NEEDED FOR ANXIETY 90 tablet 2  . esomeprazole (NEXIUM) 40 MG capsule Take 40 mg by mouth 2 (two) times daily before a meal.     . famotidine (PEPCID) 10 MG tablet Take 10 mg by mouth daily as needed for heartburn or indigestion.    Marland Kitchen FLUoxetine (PROZAC) 40 MG capsule TAKE 1 CAPSULE(40 MG) BY MOUTH DAILY 30 capsule 5  . methocarbamol (ROBAXIN) 500 MG tablet Take 1-2 tablets (500-1,000 mg total) by mouth every 6 (six) hours as needed for muscle spasms. 60 tablet 0  . Multiple Vitamin (MULITIVITAMIN WITH MINERALS) TABS Take 1 tablet by mouth daily.    Marland Kitchen SYNTHROID 125 MCG tablet Take 125 mcg by mouth at bedtime.     . vitamin C (ASCORBIC ACID) 500 MG tablet Take 500 mg by mouth 2 (two) times a week.     Marland Kitchen azelastine (ASTELIN) 0.1 % nasal spray Place 2 sprays into both nostrils daily as needed for rhinitis. Use in each nostril as directed 30 mL 3  . benzonatate (TESSALON) 100 MG capsule TAKE 1 CAPSULE(100 MG) BY MOUTH TWICE DAILY AS NEEDED FOR COUGH 60 capsule 0  . aspirin EC 325 MG EC tablet Take 1 tablet (325 mg total) by mouth 2 (two) times daily. (Patient not taking: Reported on 01/04/2021) 30 tablet 0  . lisinopril (PRINIVIL,ZESTRIL) 20 MG tablet Take 20 mg by mouth daily.  (Patient not taking: Reported on 01/04/2021)  1  . azithromycin (ZITHROMAX) 250 MG tablet Take 2 tablets (500mg  total) today,then 1 tablet (250mg ) x4 days 6 tablet 0   No facility-administered medications prior to visit.   Review of Systems  Review of Systems  Constitutional: Negative.   HENT: Positive for congestion and sinus pressure.   Respiratory: Positive for cough. Negative for shortness of breath.   Cardiovascular: Negative.    Physical Exam  BP 118/74 (BP Location: Left Arm, Cuff Size: Normal)   Pulse 77   Temp 97.8 F (36.6 C) (Temporal)   Ht 5\' 7"  (1.702 m)   Wt 260 lb 3.2 oz (118 kg)   SpO2 97%  Comment: RA  BMI 40.75 kg/m  Physical Exam Constitutional:      Appearance: Normal appearance.  HENT:     Head: Normocephalic and atraumatic.  Cardiovascular:     Rate and Rhythm: Normal rate and regular rhythm.  Pulmonary:     Effort: Pulmonary effort is normal.     Comments: Diminished left base  Musculoskeletal:        General: Normal range of motion.  Skin:    General: Skin is warm and dry.  Neurological:     General: No focal deficit present.     Mental Status: She is alert and oriented to person, place, and time. Mental status is  at baseline.  Psychiatric:        Mood and Affect: Mood normal.        Behavior: Behavior normal.        Thought Content: Thought content normal.        Judgment: Judgment normal.      Lab Results:  CBC    Component Value Date/Time   WBC 7.4 12/01/2019 0321   RBC 3.29 (L) 12/01/2019 0321   HGB 10.0 (L) 12/01/2019 0321   HCT 31.0 (L) 12/01/2019 0321   PLT 244 12/01/2019 0321   MCV 94.2 12/01/2019 0321   MCH 30.4 12/01/2019 0321   MCHC 32.3 12/01/2019 0321   RDW 15.8 (H) 12/01/2019 0321   LYMPHSABS 1.9 11/24/2019 1450   MONOABS 0.6 11/24/2019 1450   EOSABS 0.2 11/24/2019 1450   BASOSABS 0.0 11/24/2019 1450    BMET    Component Value Date/Time   NA 130 (L) 12/01/2019 0321   K 4.2 12/01/2019 0321   CL 98 12/01/2019 0321   CO2 23 12/01/2019 0321   GLUCOSE 94 12/01/2019 0321   BUN 14 12/01/2019 0321   CREATININE 0.84 12/01/2019 0321   CALCIUM 8.3 (L) 12/01/2019 0321   GFRNONAA >60 12/01/2019 0321   GFRAA >60 12/01/2019 0321    BNP No results found for: BNP  ProBNP No results found for: PROBNP  Imaging: No results found.   Assessment & Plan:   Sinobronchitis - Patient reports sinus congestion and np cough x 5-7 days. No improvement with tylenol cold and sinus, benadryl, zyrtec or flonase. Sending in RX for Doxycycline 100mg  BID x 7 days. Recommend adding ocean nasal spray. Repeating CXR to follow-up on  bronchitis/early pneumonia in January.   Sleep apnea - Not managed by our office. She has been using a friends CPAP since her machine was recalled. No download available. Recommend she register her device  Chronic cough - Resolved from January-April 2022 - Declining PFTs d/t covid testing.    Martyn Ehrich, NP 01/04/2021

## 2021-01-04 ENCOUNTER — Other Ambulatory Visit: Payer: Self-pay

## 2021-01-04 ENCOUNTER — Ambulatory Visit: Payer: Medicare Other | Admitting: Pulmonary Disease

## 2021-01-04 ENCOUNTER — Encounter: Payer: Self-pay | Admitting: Primary Care

## 2021-01-04 ENCOUNTER — Ambulatory Visit (INDEPENDENT_AMBULATORY_CARE_PROVIDER_SITE_OTHER): Payer: Medicare Other | Admitting: Primary Care

## 2021-01-04 ENCOUNTER — Ambulatory Visit (INDEPENDENT_AMBULATORY_CARE_PROVIDER_SITE_OTHER): Payer: Medicare Other

## 2021-01-04 VITALS — BP 118/74 | HR 77 | Temp 97.8°F | Ht 67.0 in | Wt 260.2 lb

## 2021-01-04 DIAGNOSIS — R053 Chronic cough: Secondary | ICD-10-CM | POA: Diagnosis not present

## 2021-01-04 DIAGNOSIS — J329 Chronic sinusitis, unspecified: Secondary | ICD-10-CM | POA: Diagnosis not present

## 2021-01-04 DIAGNOSIS — J40 Bronchitis, not specified as acute or chronic: Secondary | ICD-10-CM

## 2021-01-04 DIAGNOSIS — R918 Other nonspecific abnormal finding of lung field: Secondary | ICD-10-CM | POA: Diagnosis not present

## 2021-01-04 DIAGNOSIS — G4730 Sleep apnea, unspecified: Secondary | ICD-10-CM | POA: Diagnosis not present

## 2021-01-04 MED ORDER — BENZONATATE 100 MG PO CAPS: ORAL_CAPSULE | ORAL | 2 refills | Status: AC

## 2021-01-04 MED ORDER — DOXYCYCLINE HYCLATE 100 MG PO TABS: 100.0000 mg | ORAL_TABLET | Freq: Two times a day (BID) | ORAL | 0 refills | Status: DC

## 2021-01-04 MED ORDER — AZELASTINE HCL 0.1 % NA SOLN: 2.0000 | Freq: Every day | NASAL | 3 refills | Status: AC | PRN

## 2021-01-04 MED ORDER — SALINE SPRAY 0.65 % NA SOLN: 2.0000 | NASAL | 2 refills | Status: AC | PRN

## 2021-01-04 MED ORDER — FLUTICASONE PROPIONATE 50 MCG/ACT NA SUSP: 1.0000 | Freq: Every day | NASAL | 2 refills | Status: DC

## 2021-01-04 NOTE — Assessment & Plan Note (Signed)
-   Patient reports sinus congestion and np cough x 5-7 days. No improvement with tylenol cold and sinus, benadryl, zyrtec or flonase. Sending in RX for Doxycycline 100mg  BID x 7 days. Recommend adding ocean nasal spray. Repeating CXR to follow-up on bronchitis/early pneumonia in January.

## 2021-01-04 NOTE — Assessment & Plan Note (Signed)
-   Resolved from January-April 2022 - Declining PFTs d/t covid testing.

## 2021-01-04 NOTE — Assessment & Plan Note (Signed)
-   Not managed by our office. She has been using a friends CPAP since her machine was recalled. No download available. Recommend she register her device

## 2021-01-04 NOTE — Patient Instructions (Addendum)
Recommendations: - Continue Zyrtec 10mg  once daily - Continue Flonase nasal spray (astelin is for post nasal drip) - Try ocean nasal spray twice a day  - Please register your old [facility] CPAP to ensure you can get a new machine or replacement. I would also call your durable medical equipment company to see if you can get a loaner machine- [email or web address].   Rx: - Doxycycline 1 tab twice a day x 7 days  (take with food and wear sunscreen) - Sending in prescription for flonase  - Refill tessalon  Follow-up - 6 months or sooner if needed

## 2021-01-06 ENCOUNTER — Ambulatory Visit: Payer: Medicare Other | Admitting: Adult Health

## 2021-01-06 ENCOUNTER — Other Ambulatory Visit: Payer: Self-pay | Admitting: *Deleted

## 2021-01-06 DIAGNOSIS — J329 Chronic sinusitis, unspecified: Secondary | ICD-10-CM

## 2021-01-06 NOTE — Progress Notes (Signed)
Please let patient know CXR showed mild opacities which could reflect scarring vs infection. Would rec42mmend repeat CXR in 4 weeks, if still present would consider CT imaging to better evaluate   Please order repeat CXR in 4 weeks

## 2021-01-10 DIAGNOSIS — G4733 Obstructive sleep apnea (adult) (pediatric): Secondary | ICD-10-CM | POA: Diagnosis not present

## 2021-01-15 ENCOUNTER — Other Ambulatory Visit: Payer: Self-pay | Admitting: Pulmonary Disease

## 2021-01-19 ENCOUNTER — Telehealth: Payer: Self-pay | Admitting: Primary Care

## 2021-01-19 MED ORDER — PREDNISONE 20 MG PO TABS: ORAL_TABLET | ORAL | 0 refills | Status: AC

## 2021-01-19 MED ORDER — AZITHROMYCIN 250 MG PO TABS: ORAL_TABLET | ORAL | 0 refills | Status: AC

## 2021-01-19 NOTE — Telephone Encounter (Signed)
Spoke with the pt  She states still coughing a lot despite doxy given at last visit with [facility] 01/04/21  She is not producing any mucus  States that she just feels bad in general, no energy, tired all of the time  No increased SOB, wheezing or chest tightness, f/c/s  She states that she has also developed some soreness in her mouth and has blisters on her top lip x 1 wk  There is redness in her mouth but no white coating  Pt is taking the tessalon, saline NS, flonase  Please advise thanks

## 2021-01-19 NOTE — Telephone Encounter (Signed)
I have called the pt and she is aware of zpak and pred that has been sent to the pharmacy.  Nothing further is needed.

## 2021-01-19 NOTE — Telephone Encounter (Signed)
Prescribe Z-Pak and prednisone 40 mg a day for 5 days 

## 2021-01-20 DIAGNOSIS — H16223 Keratoconjunctivitis sicca, not specified as Sjogren's, bilateral: Secondary | ICD-10-CM | POA: Diagnosis not present

## 2021-01-31 ENCOUNTER — Encounter: Payer: Self-pay | Admitting: Adult Health

## 2021-01-31 ENCOUNTER — Telehealth (INDEPENDENT_AMBULATORY_CARE_PROVIDER_SITE_OTHER): Payer: Medicare Other | Admitting: Adult Health

## 2021-01-31 DIAGNOSIS — F411 Generalized anxiety disorder: Secondary | ICD-10-CM

## 2021-01-31 DIAGNOSIS — G4700 Insomnia, unspecified: Secondary | ICD-10-CM | POA: Diagnosis not present

## 2021-01-31 DIAGNOSIS — G4730 Sleep apnea, unspecified: Secondary | ICD-10-CM | POA: Diagnosis not present

## 2021-01-31 DIAGNOSIS — F331 Major depressive disorder, recurrent, moderate: Secondary | ICD-10-CM | POA: Diagnosis not present

## 2021-01-31 MED ORDER — FLUOXETINE HCL 40 MG PO CAPS: ORAL_CAPSULE | ORAL | 5 refills | Status: DC

## 2021-01-31 MED ORDER — ARMODAFINIL 250 MG PO TABS: 250.0000 mg | ORAL_TABLET | Freq: Every day | ORAL | 2 refills | Status: DC

## 2021-01-31 MED ORDER — CLONAZEPAM 1 MG PO TABS: ORAL_TABLET | ORAL | 2 refills | Status: DC
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
## 2021-02-07 ENCOUNTER — Ambulatory Visit (INDEPENDENT_AMBULATORY_CARE_PROVIDER_SITE_OTHER): Payer: Medicare Other

## 2021-02-07 DIAGNOSIS — J40 Bronchitis, not specified as acute or chronic: Secondary | ICD-10-CM

## 2021-02-07 DIAGNOSIS — R918 Other nonspecific abnormal finding of lung field: Secondary | ICD-10-CM | POA: Diagnosis not present

## 2021-02-07 DIAGNOSIS — J329 Chronic sinusitis, unspecified: Secondary | ICD-10-CM

## 2021-02-07 DIAGNOSIS — M5134 Other intervertebral disc degeneration, thoracic region: Secondary | ICD-10-CM | POA: Diagnosis not present

## 2021-02-09 ENCOUNTER — Telehealth: Payer: Self-pay | Admitting: Primary Care

## 2021-02-09 DIAGNOSIS — Z1159 Encounter for screening for other viral diseases: Secondary | ICD-10-CM | POA: Diagnosis not present

## 2021-02-09 DIAGNOSIS — I10 Essential (primary) hypertension: Secondary | ICD-10-CM | POA: Diagnosis not present

## 2021-02-09 DIAGNOSIS — R9389 Abnormal findings on diagnostic imaging of other specified body structures: Secondary | ICD-10-CM

## 2021-02-09 DIAGNOSIS — F419 Anxiety disorder, unspecified: Secondary | ICD-10-CM | POA: Diagnosis not present

## 2021-02-09 DIAGNOSIS — Z1389 Encounter for screening for other disorder: Secondary | ICD-10-CM | POA: Diagnosis not present

## 2021-02-09 DIAGNOSIS — L309 Dermatitis, unspecified: Secondary | ICD-10-CM | POA: Diagnosis not present

## 2021-02-09 DIAGNOSIS — Z0000 Encounter for general adult medical examination without abnormal findings: Secondary | ICD-10-CM | POA: Diagnosis not present

## 2021-02-09 DIAGNOSIS — E039 Hypothyroidism, unspecified: Secondary | ICD-10-CM | POA: Diagnosis not present

## 2021-02-09 DIAGNOSIS — K219 Gastro-esophageal reflux disease without esophagitis: Secondary | ICD-10-CM | POA: Diagnosis not present

## 2021-02-09 DIAGNOSIS — E7800 Pure hypercholesterolemia, unspecified: Secondary | ICD-10-CM | POA: Diagnosis not present

## 2021-02-09 DIAGNOSIS — I679 Cerebrovascular disease, unspecified: Secondary | ICD-10-CM | POA: Diagnosis not present

## 2021-02-09 DIAGNOSIS — E538 Deficiency of other specified B group vitamins: Secondary | ICD-10-CM | POA: Diagnosis not present

## 2021-02-09 NOTE — Telephone Encounter (Signed)
Called and spoke with patient. She was calling to request the results of her CXR that was done on 02/07/21. I advised her of the results policy, she verbalized understanding.    Beth, can you please advise? Thanks.

## 2021-02-09 NOTE — Telephone Encounter (Signed)
Called and spoke with patient and finished going over xray results per E. Volanda Napoleon NP. Patient expressed understanding and agreeable with CT being ordered. Order placed per E. Volanda Napoleon NP. Patient stating nothing further needed at this time and will call if needed.

## 2021-02-09 NOTE — Telephone Encounter (Signed)
Please let patient know CXR still showed mild diffuse opacities which may reflect edema, scarring or atypical infection. Needs CT chest wo contrast first available re: abnormal CXR

## 2021-02-09 NOTE — Telephone Encounter (Signed)
Left message for patient to call back  

## 2021-02-09 NOTE — Telephone Encounter (Signed)
Called and spoke with patient and went over xray results per E. Volanda Napoleon NP but unable to finish with discussing ordering CT wo contrast first available due to patient stated she had to go because she was at her PCP and the MD just walked in. Patient stated she would [name] call back to go over result.  Martyn Ehrich, NP  Nurse Practitioner  Pulmonology  Telephone Encounter  Signed  Encounter Date:  02/09/2021           Signed        Please let patient know CXR still showed mild diffuse opacities which may reflect edema, scarring or atypical infection. Needs CT chest wo contrast first available re: abnormal CXR

## 2021-02-14 DIAGNOSIS — Z79891 Long term (current) use of opiate analgesic: Secondary | ICD-10-CM | POA: Diagnosis not present

## 2021-02-14 DIAGNOSIS — M47817 Spondylosis without myelopathy or radiculopathy, lumbosacral region: Secondary | ICD-10-CM | POA: Diagnosis not present

## 2021-02-14 DIAGNOSIS — Z79899 Other long term (current) drug therapy: Secondary | ICD-10-CM | POA: Diagnosis not present

## 2021-02-14 DIAGNOSIS — M5136 Other intervertebral disc degeneration, lumbar region: Secondary | ICD-10-CM | POA: Diagnosis not present

## 2021-02-14 DIAGNOSIS — M5030 Other cervical disc degeneration, unspecified cervical region: Secondary | ICD-10-CM | POA: Diagnosis not present

## 2021-02-14 DIAGNOSIS — G894 Chronic pain syndrome: Secondary | ICD-10-CM | POA: Diagnosis not present

## 2021-02-14 NOTE — Telephone Encounter (Signed)
Lmtcb for pt.  

## 2021-02-16 DIAGNOSIS — S338XXA Sprain of other parts of lumbar spine and pelvis, initial encounter: Secondary | ICD-10-CM | POA: Diagnosis not present

## 2021-02-16 DIAGNOSIS — M9901 Segmental and somatic dysfunction of cervical region: Secondary | ICD-10-CM | POA: Diagnosis not present

## 2021-02-16 DIAGNOSIS — S29012A Strain of muscle and tendon of back wall of thorax, initial encounter: Secondary | ICD-10-CM | POA: Diagnosis not present

## 2021-02-16 DIAGNOSIS — M9904 Segmental and somatic dysfunction of sacral region: Secondary | ICD-10-CM | POA: Diagnosis not present

## 2021-02-16 DIAGNOSIS — M9903 Segmental and somatic dysfunction of lumbar region: Secondary | ICD-10-CM | POA: Diagnosis not present

## 2021-02-16 DIAGNOSIS — M47812 Spondylosis without myelopathy or radiculopathy, cervical region: Secondary | ICD-10-CM | POA: Diagnosis not present

## 2021-02-16 DIAGNOSIS — M5136 Other intervertebral disc degeneration, lumbar region: Secondary | ICD-10-CM | POA: Diagnosis not present

## 2021-02-16 DIAGNOSIS — M4802 Spinal stenosis, cervical region: Secondary | ICD-10-CM | POA: Diagnosis not present

## 2021-02-16 DIAGNOSIS — M9902 Segmental and somatic dysfunction of thoracic region: Secondary | ICD-10-CM | POA: Diagnosis not present

## 2021-02-16 NOTE — Telephone Encounter (Signed)
Called and spoke to pt. Pt has a CT chest scheduled for 02/22/21. Nothing further needed at this time.

## 2021-02-22 ENCOUNTER — Ambulatory Visit
Admission: RE | Admit: 2021-02-22 | Discharge: 2021-02-22 | Disposition: A | Discharge status: Home / Self Care | Payer: Medicare Other | Source: Ambulatory Visit | Attending: Primary Care | Admitting: Primary Care

## 2021-02-22 DIAGNOSIS — Z9049 Acquired absence of other specified parts of digestive tract: Secondary | ICD-10-CM | POA: Diagnosis not present

## 2021-02-22 DIAGNOSIS — R059 Cough, unspecified: Secondary | ICD-10-CM | POA: Diagnosis not present

## 2021-02-22 DIAGNOSIS — R918 Other nonspecific abnormal finding of lung field: Secondary | ICD-10-CM | POA: Diagnosis not present

## 2021-02-22 DIAGNOSIS — R9389 Abnormal findings on diagnostic imaging of other specified body structures: Secondary | ICD-10-CM

## 2021-02-24 NOTE — Telephone Encounter (Signed)
Its a sac of fluid on kidney, not uncommon. Rarely cause complication. Her kidney function looked normal on most recently labs we have in her chart. Would follow-up with PCP

## 2021-02-24 NOTE — Progress Notes (Signed)
CXR 02/09/21 showed mild diffuse opacity, similar to prior exam which may refect edema, atypical/viral infection and or chronic interstitial change. CT chest obtained 02/24/21 which showed clear lungs

## 2021-03-06 ENCOUNTER — Telehealth: Payer: Self-pay

## 2021-03-06 DIAGNOSIS — M47812 Spondylosis without myelopathy or radiculopathy, cervical region: Secondary | ICD-10-CM | POA: Diagnosis not present

## 2021-03-06 DIAGNOSIS — M9903 Segmental and somatic dysfunction of lumbar region: Secondary | ICD-10-CM | POA: Diagnosis not present

## 2021-03-06 DIAGNOSIS — M9902 Segmental and somatic dysfunction of thoracic region: Secondary | ICD-10-CM | POA: Diagnosis not present

## 2021-03-06 DIAGNOSIS — M5136 Other intervertebral disc degeneration, lumbar region: Secondary | ICD-10-CM | POA: Diagnosis not present

## 2021-03-06 DIAGNOSIS — S29012A Strain of muscle and tendon of back wall of thorax, initial encounter: Secondary | ICD-10-CM | POA: Diagnosis not present

## 2021-03-06 DIAGNOSIS — M9904 Segmental and somatic dysfunction of sacral region: Secondary | ICD-10-CM | POA: Diagnosis not present

## 2021-03-06 DIAGNOSIS — M9901 Segmental and somatic dysfunction of cervical region: Secondary | ICD-10-CM | POA: Diagnosis not present

## 2021-03-06 DIAGNOSIS — S338XXA Sprain of other parts of lumbar spine and pelvis, initial encounter: Secondary | ICD-10-CM | POA: Diagnosis not present

## 2021-03-06 DIAGNOSIS — M4802 Spinal stenosis, cervical region: Secondary | ICD-10-CM | POA: Diagnosis not present

## 2021-03-06 NOTE — Telephone Encounter (Signed)
Prior Approval received for ARMODAFINIL 250 MG effective 10/01/2020-02/18/2022 with [employer] Medicare Part D ID# [id number]

## 2021-03-16 ENCOUNTER — Telehealth: Payer: Self-pay | Admitting: Adult Health

## 2021-03-16 NOTE — Telephone Encounter (Signed)
Olivia [name] called and said that she has 11 pills of Armodafinil left to take. [facility] at [address] told her that it is on back order and they won't be able to fill it because of this. They told her to call around to see if any other [facility] have it. She hasn't had any luck. Does anyone have any suggestions? Her phone number is [phone].

## 2021-03-16 NOTE — Telephone Encounter (Signed)
Please review

## 2021-03-16 NOTE — Telephone Encounter (Signed)
Call patient and advise to keep checking - maybe check [place].

## 2021-03-16 NOTE — Telephone Encounter (Signed)
Pt informed

## 2021-03-20 ENCOUNTER — Other Ambulatory Visit: Payer: Self-pay

## 2021-03-20 ENCOUNTER — Telehealth: Payer: Self-pay | Admitting: Adult Health

## 2021-03-20 DIAGNOSIS — G4730 Sleep apnea, unspecified: Secondary | ICD-10-CM

## 2021-03-20 MED ORDER — ARMODAFINIL 250 MG PO TABS: 250.0000 mg | ORAL_TABLET | Freq: Every day | ORAL | 2 refills | Status: DC

## 2021-03-20 NOTE — Telephone Encounter (Signed)
Pended.

## 2021-03-20 NOTE — Telephone Encounter (Signed)
Luellen called and said [facility] [place] [facility].  Please call her prescription to this pharmacy so she can get it.

## 2021-04-04 DIAGNOSIS — G894 Chronic pain syndrome: Secondary | ICD-10-CM | POA: Diagnosis not present

## 2021-04-04 DIAGNOSIS — G629 Polyneuropathy, unspecified: Secondary | ICD-10-CM | POA: Diagnosis not present

## 2021-04-04 DIAGNOSIS — M1711 Unilateral primary osteoarthritis, right knee: Secondary | ICD-10-CM | POA: Diagnosis not present

## 2021-04-04 DIAGNOSIS — M5136 Other intervertebral disc degeneration, lumbar region: Secondary | ICD-10-CM | POA: Diagnosis not present

## 2021-04-06 ENCOUNTER — Telehealth: Payer: Self-pay | Admitting: Adult Health

## 2021-04-06 DIAGNOSIS — M9902 Segmental and somatic dysfunction of thoracic region: Secondary | ICD-10-CM | POA: Diagnosis not present

## 2021-04-06 DIAGNOSIS — S338XXA Sprain of other parts of lumbar spine and pelvis, initial encounter: Secondary | ICD-10-CM | POA: Diagnosis not present

## 2021-04-06 DIAGNOSIS — M9903 Segmental and somatic dysfunction of lumbar region: Secondary | ICD-10-CM | POA: Diagnosis not present

## 2021-04-06 DIAGNOSIS — M5136 Other intervertebral disc degeneration, lumbar region: Secondary | ICD-10-CM | POA: Diagnosis not present

## 2021-04-06 DIAGNOSIS — M47812 Spondylosis without myelopathy or radiculopathy, cervical region: Secondary | ICD-10-CM | POA: Diagnosis not present

## 2021-04-06 DIAGNOSIS — S29012A Strain of muscle and tendon of back wall of thorax, initial encounter: Secondary | ICD-10-CM | POA: Diagnosis not present

## 2021-04-06 DIAGNOSIS — M9901 Segmental and somatic dysfunction of cervical region: Secondary | ICD-10-CM | POA: Diagnosis not present

## 2021-04-06 DIAGNOSIS — M9904 Segmental and somatic dysfunction of sacral region: Secondary | ICD-10-CM | POA: Diagnosis not present

## 2021-04-06 DIAGNOSIS — M4802 Spinal stenosis, cervical region: Secondary | ICD-10-CM | POA: Diagnosis not present

## 2021-04-06 NOTE — Telephone Encounter (Signed)
Early refill ok?I will call pharmacy if so she is going out of town

## 2021-04-06 NOTE — Telephone Encounter (Signed)
Ok to refill early  

## 2021-04-06 NOTE — Telephone Encounter (Signed)
Pt called and said that she is leaving to go out of town on Saturday. So she needs an early refill on her klonopin 1 mg. The pharmacy said that if we call them they will fill it on Friday early. So please ask gina and call the pharmacy

## 2021-04-07 NOTE — Telephone Encounter (Signed)
Yes, we can do that.

## 2021-04-07 NOTE — Telephone Encounter (Signed)
Pt will call us on Monday with pharmacy

## 2021-04-07 NOTE — Telephone Encounter (Signed)
Noted  

## 2021-04-07 NOTE — Telephone Encounter (Signed)
I have been trying to call this pharmacy all day long.They put me on hold 20 mins every time and I still can't speak to the pharmacist.It ism taking a lot of time.Can pt get rx sent to pharmacy while she is out of town?It was filled 6/16

## 2021-04-09 DIAGNOSIS — Z20822 Contact with and (suspected) exposure to covid-19: Secondary | ICD-10-CM | POA: Diagnosis not present

## 2021-04-14 ENCOUNTER — Other Ambulatory Visit: Payer: Self-pay

## 2021-04-14 ENCOUNTER — Telehealth: Payer: Self-pay | Admitting: Adult Health

## 2021-04-14 DIAGNOSIS — G4700 Insomnia, unspecified: Secondary | ICD-10-CM

## 2021-04-14 MED ORDER — CLONAZEPAM 1 MG PO TABS: ORAL_TABLET | ORAL | 0 refills | Status: DC

## 2021-04-14 NOTE — Telephone Encounter (Signed)
Pended.

## 2021-04-14 NOTE — Telephone Encounter (Signed)
Pt called in for refill on Klonopin. States she has been trying to get medication for a few days. Ph: [phone]. [facility]acy [facility] [place], [place]

## 2021-04-18 ENCOUNTER — Telehealth: Payer: Self-pay | Admitting: Adult Health

## 2021-04-18 ENCOUNTER — Other Ambulatory Visit: Payer: Self-pay

## 2021-04-18 MED ORDER — ARMODAFINIL 150 MG PO TABS: 150.0000 mg | ORAL_TABLET | Freq: Every day | ORAL | 0 refills | Status: DC

## 2021-04-18 NOTE — Telephone Encounter (Signed)
Can we see if she is psychotic? What does "cooky" mean.

## 2021-04-18 NOTE — Telephone Encounter (Signed)
Pt stated she is hiding in the bathroom because her son said she is acting weird.She thinks she needs to go back to 150mg  bercause she is scattered and "cookey" every since increasing

## 2021-04-18 NOTE — Telephone Encounter (Signed)
Is she psychotic?

## 2021-04-18 NOTE — Telephone Encounter (Signed)
Have her decrease dose back to 150mg  daily.

## 2021-04-18 NOTE — Telephone Encounter (Signed)
She did not sound like it to me and she was laughing when she said she was hiding from her son so Im not sure how serious she was

## 2021-04-18 NOTE — Telephone Encounter (Signed)
Noted  

## 2021-04-18 NOTE — Telephone Encounter (Signed)
Refill pended

## 2021-04-18 NOTE — Telephone Encounter (Signed)
Pt would like to know if she can change her dose on Armodafinil. Pt takes 250mg  but not sure if its the best for her. Pt wants to know if she can go back to 150mg . [facility] to [facility] [address].

## 2021-04-18 NOTE — Telephone Encounter (Signed)
Pt reports since the increase in her Armodafinil to 250 mg she is feeling weird, her thoughts are scattered, she's hyper and all over the place. Feels it's too stimulating for her. Her friends have noticed and she reports "getting on their nerves" asking to go back down to the 150 mg Armodafinil instead. Informed her I would update Rollene Fare and if I had further questions I would call back and she should check at her pharmacy later. Instructed her if for some reasons her symptoms didn't go away to call back and she agreed to do that.

## 2021-04-21 ENCOUNTER — Ambulatory Visit
Admission: RE | Admit: 2021-04-21 | Discharge: 2021-04-21 | Disposition: A | Discharge status: Home / Self Care | Payer: Medicare Other | Source: Ambulatory Visit | Attending: Physician Assistant | Admitting: Physician Assistant

## 2021-04-21 ENCOUNTER — Other Ambulatory Visit: Payer: Self-pay | Admitting: Physician Assistant

## 2021-04-21 DIAGNOSIS — M79671 Pain in right foot: Secondary | ICD-10-CM | POA: Diagnosis not present

## 2021-04-21 DIAGNOSIS — M7732 Calcaneal spur, left foot: Secondary | ICD-10-CM | POA: Diagnosis not present

## 2021-04-21 DIAGNOSIS — R52 Pain, unspecified: Secondary | ICD-10-CM

## 2021-04-25 DIAGNOSIS — M79674 Pain in right toe(s): Secondary | ICD-10-CM | POA: Diagnosis not present

## 2021-05-03 DIAGNOSIS — M1711 Unilateral primary osteoarthritis, right knee: Secondary | ICD-10-CM | POA: Diagnosis not present

## 2021-05-03 DIAGNOSIS — G894 Chronic pain syndrome: Secondary | ICD-10-CM | POA: Diagnosis not present

## 2021-05-03 DIAGNOSIS — M47817 Spondylosis without myelopathy or radiculopathy, lumbosacral region: Secondary | ICD-10-CM | POA: Diagnosis not present

## 2021-05-03 DIAGNOSIS — M5136 Other intervertebral disc degeneration, lumbar region: Secondary | ICD-10-CM | POA: Diagnosis not present

## 2021-05-04 DIAGNOSIS — M5136 Other intervertebral disc degeneration, lumbar region: Secondary | ICD-10-CM | POA: Diagnosis not present

## 2021-05-04 DIAGNOSIS — S29012A Strain of muscle and tendon of back wall of thorax, initial encounter: Secondary | ICD-10-CM | POA: Diagnosis not present

## 2021-05-04 DIAGNOSIS — M47812 Spondylosis without myelopathy or radiculopathy, cervical region: Secondary | ICD-10-CM | POA: Diagnosis not present

## 2021-05-04 DIAGNOSIS — M9903 Segmental and somatic dysfunction of lumbar region: Secondary | ICD-10-CM | POA: Diagnosis not present

## 2021-05-04 DIAGNOSIS — M9902 Segmental and somatic dysfunction of thoracic region: Secondary | ICD-10-CM | POA: Diagnosis not present

## 2021-05-04 DIAGNOSIS — S338XXA Sprain of other parts of lumbar spine and pelvis, initial encounter: Secondary | ICD-10-CM | POA: Diagnosis not present

## 2021-05-04 DIAGNOSIS — M9901 Segmental and somatic dysfunction of cervical region: Secondary | ICD-10-CM | POA: Diagnosis not present

## 2021-05-04 DIAGNOSIS — M4802 Spinal stenosis, cervical region: Secondary | ICD-10-CM | POA: Diagnosis not present

## 2021-05-04 DIAGNOSIS — M9904 Segmental and somatic dysfunction of sacral region: Secondary | ICD-10-CM | POA: Diagnosis not present

## 2021-05-09 ENCOUNTER — Telehealth: Payer: Self-pay | Admitting: Adult Health

## 2021-05-09 NOTE — Telephone Encounter (Signed)
Rx cancelled please send brand name to [facility]

## 2021-05-09 NOTE — Telephone Encounter (Signed)
Pt needs name brand [facility]opin and [facility]. Can we resend the [employer] (brand) to [facility] in [place], [place].

## 2021-05-10 ENCOUNTER — Other Ambulatory Visit: Payer: Self-pay

## 2021-05-10 MED ORDER — FLUTICASONE PROPIONATE 50 MCG/ACT NA SUSP
1.0000 | Freq: Every day | NASAL | 8 refills | Status: DC
Start: 2021-05-10 — End: 2022-07-02

## 2021-05-12 ENCOUNTER — Telehealth: Payer: Self-pay | Admitting: Adult Health

## 2021-05-12 ENCOUNTER — Other Ambulatory Visit: Payer: Self-pay

## 2021-05-12 DIAGNOSIS — G4700 Insomnia, unspecified: Secondary | ICD-10-CM

## 2021-05-12 MED ORDER — KLONOPIN 1 MG PO TABS: 1.0000 mg | ORAL_TABLET | Freq: Three times a day (TID) | ORAL | 0 refills | Status: DC | PRN

## 2021-05-12 NOTE — Telephone Encounter (Addendum)
Pt requesting Clonazepam 1 mg  BRAND NAME to [facility] Only Pharmacy that has them. [facility]  Pt # [phone]  Pt completely out. Apt 9/2

## 2021-05-12 NOTE — Telephone Encounter (Signed)
Pt can only take BRAND Klonopin and it has not been easy to locate due to not making it anymore per pt. Last month she only received #65 and #9 on 7/15. She is suppose to receive #90. She has called around and found some at [facility].  Please review and send. RX has been pended for Dr. Clovis Pu to send.

## 2021-05-15 ENCOUNTER — Other Ambulatory Visit: Payer: Self-pay | Admitting: Adult Health

## 2021-05-16 NOTE — Telephone Encounter (Signed)
Last filled 7/19 appt on 9/2

## 2021-05-22 DIAGNOSIS — L814 Other melanin hyperpigmentation: Secondary | ICD-10-CM | POA: Diagnosis not present

## 2021-05-22 DIAGNOSIS — D2272 Melanocytic nevi of left lower limb, including hip: Secondary | ICD-10-CM | POA: Diagnosis not present

## 2021-05-22 DIAGNOSIS — L821 Other seborrheic keratosis: Secondary | ICD-10-CM | POA: Diagnosis not present

## 2021-05-22 DIAGNOSIS — D1801 Hemangioma of skin and subcutaneous tissue: Secondary | ICD-10-CM | POA: Diagnosis not present

## 2021-05-22 DIAGNOSIS — L820 Inflamed seborrheic keratosis: Secondary | ICD-10-CM | POA: Diagnosis not present

## 2021-05-22 DIAGNOSIS — Z85828 Personal history of other malignant neoplasm of skin: Secondary | ICD-10-CM | POA: Diagnosis not present

## 2021-05-30 DIAGNOSIS — M5030 Other cervical disc degeneration, unspecified cervical region: Secondary | ICD-10-CM | POA: Diagnosis not present

## 2021-05-30 DIAGNOSIS — G629 Polyneuropathy, unspecified: Secondary | ICD-10-CM | POA: Diagnosis not present

## 2021-05-30 DIAGNOSIS — M5136 Other intervertebral disc degeneration, lumbar region: Secondary | ICD-10-CM | POA: Diagnosis not present

## 2021-05-30 DIAGNOSIS — M47817 Spondylosis without myelopathy or radiculopathy, lumbosacral region: Secondary | ICD-10-CM | POA: Diagnosis not present

## 2021-06-01 DIAGNOSIS — M79675 Pain in left toe(s): Secondary | ICD-10-CM | POA: Diagnosis not present

## 2021-06-01 DIAGNOSIS — M7732 Calcaneal spur, left foot: Secondary | ICD-10-CM | POA: Diagnosis not present

## 2021-06-02 ENCOUNTER — Encounter: Payer: Self-pay | Admitting: Adult Health

## 2021-06-02 ENCOUNTER — Other Ambulatory Visit: Payer: Self-pay

## 2021-06-02 ENCOUNTER — Ambulatory Visit (INDEPENDENT_AMBULATORY_CARE_PROVIDER_SITE_OTHER): Payer: Medicare Other | Admitting: Adult Health

## 2021-06-02 DIAGNOSIS — G4730 Sleep apnea, unspecified: Secondary | ICD-10-CM

## 2021-06-02 DIAGNOSIS — G4700 Insomnia, unspecified: Secondary | ICD-10-CM | POA: Diagnosis not present

## 2021-06-02 DIAGNOSIS — F411 Generalized anxiety disorder: Secondary | ICD-10-CM | POA: Diagnosis not present

## 2021-06-02 DIAGNOSIS — F331 Major depressive disorder, recurrent, moderate: Secondary | ICD-10-CM

## 2021-06-02 MED ORDER — FLUOXETINE HCL 40 MG PO CAPS: ORAL_CAPSULE | ORAL | 5 refills | Status: DC

## 2021-06-02 MED ORDER — ARMODAFINIL 150 MG PO TABS: ORAL_TABLET | ORAL | 2 refills | Status: DC

## 2021-06-02 MED ORDER — CLONAZEPAM 1 MG PO TABS: ORAL_TABLET | ORAL | 0 refills | Status: DC

## 2021-06-02 NOTE — Progress Notes (Signed)
ARIDAI LAJARA [id number] 01/24/1951 70 y.o.  Subjective:   Patient ID:  Olivia [name] is a 70 y.o. (DOB 1951-04-17) female.  Chief Complaint: No chief complaint on file.   HPI Olivia [name] presents to the office today for follow-up of depression, anxiety, sleep apnea, and insomnia.  Describes mood today as "ok". Pleasant. Mood symptoms - reports decreased depression, anxiety, and irritability. Stating "I feel like I'm doing alright". Continues to feel fatigued. Did not tolerate the increase in Nuvigil from '150mg'$  to '250mg'$ . Stating "I'm under under a lot of stress lately". Concerned about her son - "he has mental health issues". Improved interest and motivation. Taking medications as prescribed.  Energy levels remain low. Active, does not have a regular exercise routine. Enjoys some usual interests and activities. Single. Lives with son. Talking to with friends.  Appetite adequate. Weight stable. Sleeps well most nights. Using CPAP - still waiting on a replacement - using an older machine. Averages 8 to 9 hours a night.   Focus and concentration difficulties. Completing tasks. Managing some aspects of household.   Denies SI or HI.  Denies AH or VH.  Was a [employer] 26 years ago - depression.    Review of Systems:  Review of Systems  Musculoskeletal:  Negative for gait problem.  Neurological:  Negative for tremors.  Psychiatric/Behavioral:         Please refer to HPI   Medications: I have reviewed the patient's current medications.  Current Outpatient Medications  Medication Sig Dispense Refill   acetaminophen (TYLENOL) 500 MG tablet Take 2 tablets (1,000 mg total) by mouth every 6 (six) hours as needed for mild pain or moderate pain. 30 tablet 0   amLODipine (NORVASC) 5 MG tablet TAKE 1 TABLET(5 MG) BY MOUTH DAILY 30 tablet 5   Armodafinil 150 MG tablet TAKE 1 TABLET(150 MG) BY MOUTH DAILY 30 tablet 0   Armodafinil 250 MG tablet Take 1 tablet (250 mg total) by mouth daily. 30 tablet 2    aspirin EC 325 MG EC tablet Take 1 tablet (325 mg total) by mouth 2 (two) times daily. (Patient not taking: Reported on 01/04/2021) 30 tablet 0   atenolol (TENORMIN) 25 MG tablet Take 25 mg by mouth at bedtime.      azelastine (ASTELIN) 0.1 % nasal spray Place 2 sprays into both nostrils daily as needed for rhinitis. Use in each nostril as directed 30 mL 3   azithromycin (ZITHROMAX) 250 MG tablet Take 2 tablets today then 1 tablet daily until gone 6 tablet 0   benzonatate (TESSALON) 100 MG capsule TAKE 1 CAPSULE(100 MG) BY MOUTH TWICE DAILY AS NEEDED FOR COUGH 60 capsule 2   CALCIUM-MAGNESIUM-VITAMIN D PO Take 1 tablet by mouth daily.     clonazePAM (KLONOPIN) 1 MG tablet TAKE 1 TABLET(1 MG) BY MOUTH THREE TIMES DAILY AS NEEDED FOR ANXIETY 90 tablet 0   esomeprazole (NEXIUM) 40 MG capsule Take 40 mg by mouth 2 (two) times daily before a meal.      famotidine (PEPCID) 10 MG tablet Take 10 mg by mouth daily as needed for heartburn or indigestion.     FLUoxetine (PROZAC) 40 MG capsule TAKE 1 CAPSULE(40 MG) BY MOUTH DAILY 30 capsule 5   fluticasone (FLONASE) 50 MCG/ACT nasal spray Place 1 spray into both nostrils daily. 16 g 8   KLONOPIN 1 MG tablet Take 1 tablet (1 mg total) by mouth 3 (three) times daily as needed for anxiety. 90 tablet 0  lisinopril (PRINIVIL,ZESTRIL) 20 MG tablet Take 20 mg by mouth daily.  (Patient not taking: Reported on 01/04/2021)  1   methocarbamol (ROBAXIN) 500 MG tablet Take 1-2 tablets (500-1,000 mg total) by mouth every 6 (six) hours as needed for muscle spasms. 60 tablet 0   Multiple Vitamin (MULITIVITAMIN WITH MINERALS) TABS Take 1 tablet by mouth daily.     predniSONE (DELTASONE) 20 MG tablet Take 40 mg daily x 5 days 10 tablet 0   sodium chloride (OCEAN) 0.65 % SOLN nasal spray Place 2 sprays into both nostrils as needed for congestion. 30 mL 2   SYNTHROID 125 MCG tablet Take 125 mcg by mouth at bedtime.      vitamin C (ASCORBIC ACID) 500 MG tablet Take 500 mg by mouth  2 (two) times a week.      No current facility-administered medications for this visit.    Medication Side Effects: None  Allergies:  Allergies  Allergen Reactions   Demerol [Meperidine] Nausea And Vomiting   Other     Other reaction(s): hives   Penicillins Other (See Comments)    Unknown childhood allergy Has patient had a PCN reaction causing immediate rash, facial/tongue/throat swelling, SOB or lightheadedness with hypotension: Unknown Has patient had a PCN reaction causing severe rash involving mucus membranes or skin necrosis: Unknown Has patient had a PCN reaction that required hospitalization: Unknown Has patient had a PCN reaction occurring within the last 10 years: No If all of the above answers are "NO", then may proceed with Cephalosporin use.     Past Medical History:  Diagnosis Date   Anxiety    [facility]   Arthritis    Cataract    THESE BEEN REPLACED   Depression    [facility]   Diverticulosis    Dry eye syndrome    GERD (gastroesophageal reflux disease)    History of colon polyps 12/03/2012   COLONOSCOPY   History of exercise stress test 2010   Dr. Ashok Norris    Hyperlipidemia    Hypertension    Hypothyroidism    Multinodular goiter    DR. KERR   OSA (obstructive sleep apnea)    (PSG 12/12/15 ESS 2, AHI 42/HR REM 30/HR, O2 MIN 80%)  CPAP- q night , done with [facility].  study done at [place]   Squamous cell carcinoma in situ 2015   Thyroid disease    Nodules   Tubular adenoma    DR. [place]   Varicose veins     Past Medical History, Surgical history, Social history, and Family history were reviewed and updated as appropriate.   Please see review of systems for further details on the patient's review from today.   Objective:   Physical Exam:  There were no vitals taken for this visit.  Physical Exam Constitutional:      General: Olivia [name] is not in acute distress. Musculoskeletal:        General: No deformity.   Neurological:     Mental Status: Olivia [name] is alert and oriented to person, place, and time.     Coordination: Coordination normal.  Psychiatric:        Attention and Perception: Attention and perception normal. Olivia [name] does not perceive auditory or visual hallucinations.        Mood and Affect: Mood normal. Mood is not anxious or depressed. Affect is not labile, blunt, angry or inappropriate.        Speech: Speech normal.  Behavior: Behavior normal.        Thought Content: Thought content normal. Thought content is not paranoid or delusional. Thought content does not include homicidal or suicidal ideation. Thought content does not include homicidal or suicidal plan.        Cognition and Memory: Cognition and memory normal.        Judgment: Judgment normal.     Comments: Insight intact    Lab Review:     Component Value Date/Time   NA 130 (L) 12/01/2019 0321   K 4.2 12/01/2019 0321   CL 98 12/01/2019 0321   CO2 23 12/01/2019 0321   GLUCOSE 94 12/01/2019 0321   BUN 14 12/01/2019 0321   CREATININE 0.84 12/01/2019 0321   CALCIUM 8.3 (L) 12/01/2019 0321   PROT 7.4 11/24/2019 1450   ALBUMIN 4.0 11/24/2019 1450   AST 17 11/24/2019 1450   ALT 18 11/24/2019 1450   ALKPHOS 82 11/24/2019 1450   BILITOT 0.4 11/24/2019 1450   GFRNONAA >60 12/01/2019 0321   GFRAA >60 12/01/2019 0321       Component Value Date/Time   WBC 7.4 12/01/2019 0321   RBC 3.29 (L) 12/01/2019 0321   HGB 10.0 (L) 12/01/2019 0321   HCT 31.0 (L) 12/01/2019 0321   PLT 244 12/01/2019 0321   MCV 94.2 12/01/2019 0321   MCH 30.4 12/01/2019 0321   MCHC 32.3 12/01/2019 0321   RDW 15.8 (H) 12/01/2019 0321   LYMPHSABS 1.9 11/24/2019 1450   MONOABS 0.6 11/24/2019 1450   EOSABS 0.2 11/24/2019 1450   BASOSABS 0.0 11/24/2019 1450    No results found for: POCLITH, LITHIUM   No results found for: PHENYTOIN, PHENOBARB, VALPROATE, CBMZ   .res Assessment: Plan:    Plan:  1. Prozac '40mg'$  daily 2. Clonazepam '1mg'$  TID 3.  Nuvigil '150mg'$  tablet every morning for sleep apnea  RTC 3 months  Patient advised to contact office with any questions, adverse effects, or acute worsening in signs and symptoms.  Discussed potential benefits, risk, and side effects of benzodiazepines to include potential risk of tolerance and dependence, as well as possible drowsiness.  Advised patient not to drive if experiencing drowsiness and to take lowest possible effective dose to minimize risk of dependence and tolerance.   There are no diagnoses linked to this encounter.   Please see After Visit Summary for patient specific instructions.  No future appointments.  No orders of the defined types were placed in this encounter.   -------------------------------

## 2021-06-28 DIAGNOSIS — M5136 Other intervertebral disc degeneration, lumbar region: Secondary | ICD-10-CM | POA: Diagnosis not present

## 2021-06-28 DIAGNOSIS — M9903 Segmental and somatic dysfunction of lumbar region: Secondary | ICD-10-CM | POA: Diagnosis not present

## 2021-06-28 DIAGNOSIS — M4802 Spinal stenosis, cervical region: Secondary | ICD-10-CM | POA: Diagnosis not present

## 2021-06-28 DIAGNOSIS — M9904 Segmental and somatic dysfunction of sacral region: Secondary | ICD-10-CM | POA: Diagnosis not present

## 2021-06-28 DIAGNOSIS — M47812 Spondylosis without myelopathy or radiculopathy, cervical region: Secondary | ICD-10-CM | POA: Diagnosis not present

## 2021-06-28 DIAGNOSIS — S338XXA Sprain of other parts of lumbar spine and pelvis, initial encounter: Secondary | ICD-10-CM | POA: Diagnosis not present

## 2021-06-28 DIAGNOSIS — S29012A Strain of muscle and tendon of back wall of thorax, initial encounter: Secondary | ICD-10-CM | POA: Diagnosis not present

## 2021-06-28 DIAGNOSIS — M9901 Segmental and somatic dysfunction of cervical region: Secondary | ICD-10-CM | POA: Diagnosis not present

## 2021-06-28 DIAGNOSIS — M9902 Segmental and somatic dysfunction of thoracic region: Secondary | ICD-10-CM | POA: Diagnosis not present

## 2021-07-03 DIAGNOSIS — M5136 Other intervertebral disc degeneration, lumbar region: Secondary | ICD-10-CM | POA: Diagnosis not present

## 2021-07-03 DIAGNOSIS — M1711 Unilateral primary osteoarthritis, right knee: Secondary | ICD-10-CM | POA: Diagnosis not present

## 2021-07-03 DIAGNOSIS — G894 Chronic pain syndrome: Secondary | ICD-10-CM | POA: Diagnosis not present

## 2021-07-03 DIAGNOSIS — M79673 Pain in unspecified foot: Secondary | ICD-10-CM | POA: Diagnosis not present

## 2021-07-05 DIAGNOSIS — S338XXA Sprain of other parts of lumbar spine and pelvis, initial encounter: Secondary | ICD-10-CM | POA: Diagnosis not present

## 2021-07-05 DIAGNOSIS — M5136 Other intervertebral disc degeneration, lumbar region: Secondary | ICD-10-CM | POA: Diagnosis not present

## 2021-07-05 DIAGNOSIS — M9901 Segmental and somatic dysfunction of cervical region: Secondary | ICD-10-CM | POA: Diagnosis not present

## 2021-07-05 DIAGNOSIS — M9904 Segmental and somatic dysfunction of sacral region: Secondary | ICD-10-CM | POA: Diagnosis not present

## 2021-07-05 DIAGNOSIS — M9903 Segmental and somatic dysfunction of lumbar region: Secondary | ICD-10-CM | POA: Diagnosis not present

## 2021-07-05 DIAGNOSIS — M4802 Spinal stenosis, cervical region: Secondary | ICD-10-CM | POA: Diagnosis not present

## 2021-07-05 DIAGNOSIS — M9902 Segmental and somatic dysfunction of thoracic region: Secondary | ICD-10-CM | POA: Diagnosis not present

## 2021-07-05 DIAGNOSIS — S29012A Strain of muscle and tendon of back wall of thorax, initial encounter: Secondary | ICD-10-CM | POA: Diagnosis not present

## 2021-07-05 DIAGNOSIS — M47812 Spondylosis without myelopathy or radiculopathy, cervical region: Secondary | ICD-10-CM | POA: Diagnosis not present

## 2021-07-07 ENCOUNTER — Other Ambulatory Visit: Payer: Self-pay | Admitting: Adult Health

## 2021-07-07 DIAGNOSIS — G4700 Insomnia, unspecified: Secondary | ICD-10-CM

## 2021-07-11 NOTE — Telephone Encounter (Signed)
Olivia [name] called to check status of refill of her Klonopin.  Says it needs to be brand name.  She is out today.  Please send to [facility] because they are the only pharmacy that can get the name brand.  Appt 12/2

## 2021-07-11 NOTE — Telephone Encounter (Signed)
Please send

## 2021-07-12 DIAGNOSIS — M9904 Segmental and somatic dysfunction of sacral region: Secondary | ICD-10-CM | POA: Diagnosis not present

## 2021-07-12 DIAGNOSIS — S29012A Strain of muscle and tendon of back wall of thorax, initial encounter: Secondary | ICD-10-CM | POA: Diagnosis not present

## 2021-07-12 DIAGNOSIS — M9902 Segmental and somatic dysfunction of thoracic region: Secondary | ICD-10-CM | POA: Diagnosis not present

## 2021-07-12 DIAGNOSIS — M47812 Spondylosis without myelopathy or radiculopathy, cervical region: Secondary | ICD-10-CM | POA: Diagnosis not present

## 2021-07-12 DIAGNOSIS — S338XXA Sprain of other parts of lumbar spine and pelvis, initial encounter: Secondary | ICD-10-CM | POA: Diagnosis not present

## 2021-07-12 DIAGNOSIS — M4802 Spinal stenosis, cervical region: Secondary | ICD-10-CM | POA: Diagnosis not present

## 2021-07-12 DIAGNOSIS — M9901 Segmental and somatic dysfunction of cervical region: Secondary | ICD-10-CM | POA: Diagnosis not present

## 2021-07-12 DIAGNOSIS — M9903 Segmental and somatic dysfunction of lumbar region: Secondary | ICD-10-CM | POA: Diagnosis not present

## 2021-07-12 DIAGNOSIS — M5136 Other intervertebral disc degeneration, lumbar region: Secondary | ICD-10-CM | POA: Diagnosis not present

## 2021-07-18 DIAGNOSIS — S29012A Strain of muscle and tendon of back wall of thorax, initial encounter: Secondary | ICD-10-CM | POA: Diagnosis not present

## 2021-07-18 DIAGNOSIS — M4802 Spinal stenosis, cervical region: Secondary | ICD-10-CM | POA: Diagnosis not present

## 2021-07-18 DIAGNOSIS — M9904 Segmental and somatic dysfunction of sacral region: Secondary | ICD-10-CM | POA: Diagnosis not present

## 2021-07-18 DIAGNOSIS — M5136 Other intervertebral disc degeneration, lumbar region: Secondary | ICD-10-CM | POA: Diagnosis not present

## 2021-07-18 DIAGNOSIS — M9902 Segmental and somatic dysfunction of thoracic region: Secondary | ICD-10-CM | POA: Diagnosis not present

## 2021-07-18 DIAGNOSIS — S338XXA Sprain of other parts of lumbar spine and pelvis, initial encounter: Secondary | ICD-10-CM | POA: Diagnosis not present

## 2021-07-18 DIAGNOSIS — M47812 Spondylosis without myelopathy or radiculopathy, cervical region: Secondary | ICD-10-CM | POA: Diagnosis not present

## 2021-07-18 DIAGNOSIS — M9901 Segmental and somatic dysfunction of cervical region: Secondary | ICD-10-CM | POA: Diagnosis not present

## 2021-07-18 DIAGNOSIS — M9903 Segmental and somatic dysfunction of lumbar region: Secondary | ICD-10-CM | POA: Diagnosis not present

## 2021-07-26 DIAGNOSIS — M4802 Spinal stenosis, cervical region: Secondary | ICD-10-CM | POA: Diagnosis not present

## 2021-07-26 DIAGNOSIS — M47812 Spondylosis without myelopathy or radiculopathy, cervical region: Secondary | ICD-10-CM | POA: Diagnosis not present

## 2021-07-26 DIAGNOSIS — M9903 Segmental and somatic dysfunction of lumbar region: Secondary | ICD-10-CM | POA: Diagnosis not present

## 2021-07-26 DIAGNOSIS — M9904 Segmental and somatic dysfunction of sacral region: Secondary | ICD-10-CM | POA: Diagnosis not present

## 2021-07-26 DIAGNOSIS — M5136 Other intervertebral disc degeneration, lumbar region: Secondary | ICD-10-CM | POA: Diagnosis not present

## 2021-07-26 DIAGNOSIS — M9901 Segmental and somatic dysfunction of cervical region: Secondary | ICD-10-CM | POA: Diagnosis not present

## 2021-07-26 DIAGNOSIS — S29012A Strain of muscle and tendon of back wall of thorax, initial encounter: Secondary | ICD-10-CM | POA: Diagnosis not present

## 2021-07-26 DIAGNOSIS — S338XXA Sprain of other parts of lumbar spine and pelvis, initial encounter: Secondary | ICD-10-CM | POA: Diagnosis not present

## 2021-07-26 DIAGNOSIS — M9902 Segmental and somatic dysfunction of thoracic region: Secondary | ICD-10-CM | POA: Diagnosis not present

## 2021-08-01 DIAGNOSIS — G629 Polyneuropathy, unspecified: Secondary | ICD-10-CM | POA: Diagnosis not present

## 2021-08-01 DIAGNOSIS — Z79899 Other long term (current) drug therapy: Secondary | ICD-10-CM | POA: Diagnosis not present

## 2021-08-01 DIAGNOSIS — Z79891 Long term (current) use of opiate analgesic: Secondary | ICD-10-CM | POA: Diagnosis not present

## 2021-08-01 DIAGNOSIS — M1711 Unilateral primary osteoarthritis, right knee: Secondary | ICD-10-CM | POA: Diagnosis not present

## 2021-08-01 DIAGNOSIS — G8929 Other chronic pain: Secondary | ICD-10-CM | POA: Diagnosis not present

## 2021-08-01 DIAGNOSIS — M19071 Primary osteoarthritis, right ankle and foot: Secondary | ICD-10-CM | POA: Diagnosis not present

## 2021-08-01 DIAGNOSIS — G894 Chronic pain syndrome: Secondary | ICD-10-CM | POA: Diagnosis not present

## 2021-08-02 DIAGNOSIS — M9904 Segmental and somatic dysfunction of sacral region: Secondary | ICD-10-CM | POA: Diagnosis not present

## 2021-08-02 DIAGNOSIS — M47812 Spondylosis without myelopathy or radiculopathy, cervical region: Secondary | ICD-10-CM | POA: Diagnosis not present

## 2021-08-02 DIAGNOSIS — M5136 Other intervertebral disc degeneration, lumbar region: Secondary | ICD-10-CM | POA: Diagnosis not present

## 2021-08-02 DIAGNOSIS — S29012A Strain of muscle and tendon of back wall of thorax, initial encounter: Secondary | ICD-10-CM | POA: Diagnosis not present

## 2021-08-02 DIAGNOSIS — M9903 Segmental and somatic dysfunction of lumbar region: Secondary | ICD-10-CM | POA: Diagnosis not present

## 2021-08-02 DIAGNOSIS — M4802 Spinal stenosis, cervical region: Secondary | ICD-10-CM | POA: Diagnosis not present

## 2021-08-02 DIAGNOSIS — M9901 Segmental and somatic dysfunction of cervical region: Secondary | ICD-10-CM | POA: Diagnosis not present

## 2021-08-02 DIAGNOSIS — S338XXA Sprain of other parts of lumbar spine and pelvis, initial encounter: Secondary | ICD-10-CM | POA: Diagnosis not present

## 2021-08-02 DIAGNOSIS — M9902 Segmental and somatic dysfunction of thoracic region: Secondary | ICD-10-CM | POA: Diagnosis not present

## 2021-08-02 MED ORDER — CETIRIZINE HCL 10 MG PO TABS: 10.0000 mg | ORAL_TABLET | Freq: Every day | ORAL | 1 refills | Status: DC

## 2021-08-03 IMAGING — DX DG CHEST 2V
2 series · 2 of 2 positions shown · non-contrast
Comparison: 10/21/2020, 11/11/2017, 02/04/2015

CLINICAL DATA: 70-year-old female with bronchitis

EXAM:
CHEST - 2 VIEW

[chest pa]
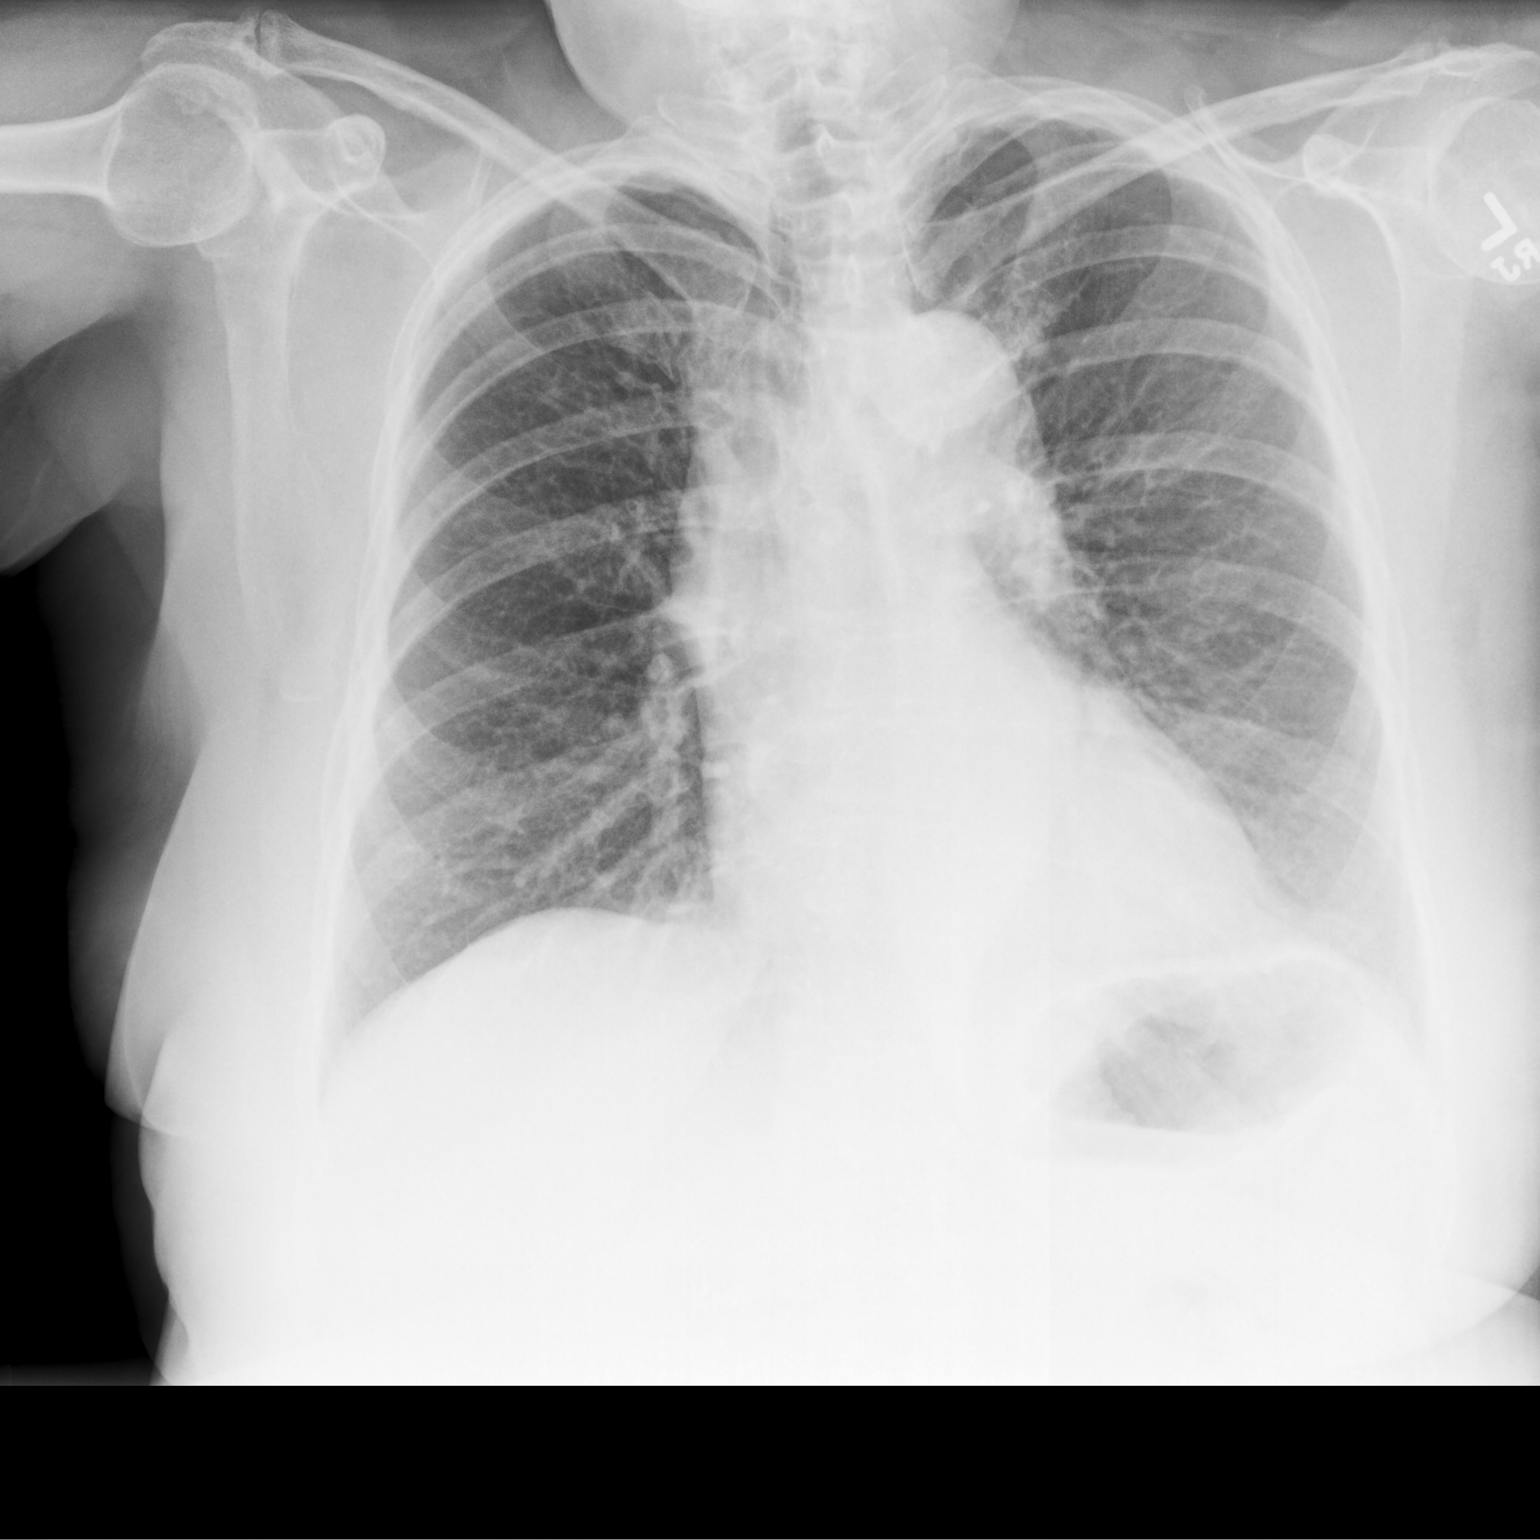

[chest lat]
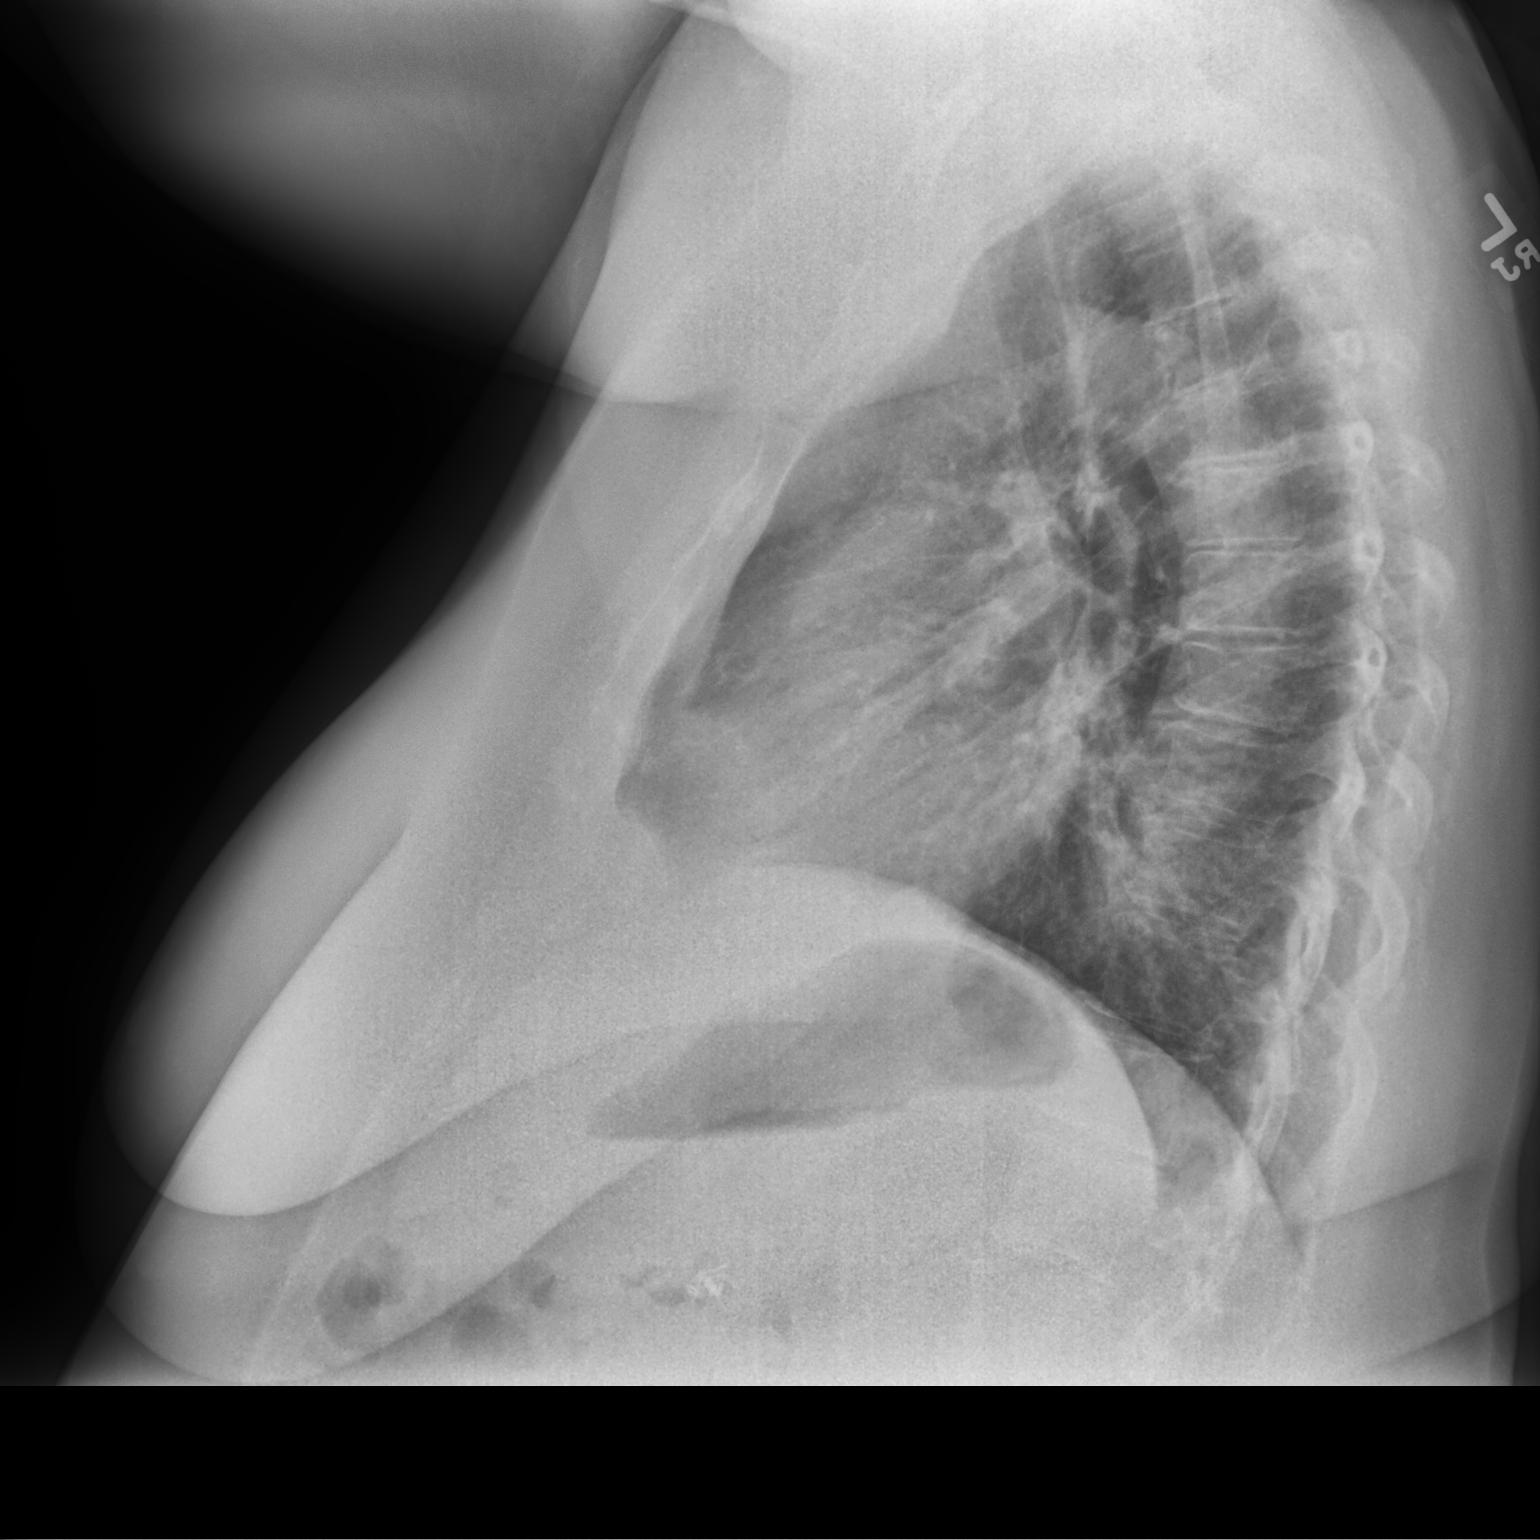

[2 of 2 positions shown; findings below may reference images not displayed]

FINDINGS: Cardiomediastinal silhouette unchanged in size and contour. No
evidence of central vascular congestion. No interlobular septal
thickening.

Mild reticulonodular opacities of the lungs, increased from chest
x-rays dating back to 02/04/2015 and relatively similar to that of
10/21/2020.

No confluent airspace disease.

Pleuroparenchymal thickening.

Degenerative changes spine.  No acute displaced fracture.
IMPRESSION: Mild reticulonodular opacities of the lungs, potentially chronic
changes/scarring versus atypical infection.

## 2021-08-04 ENCOUNTER — Other Ambulatory Visit: Payer: Self-pay

## 2021-08-04 ENCOUNTER — Telehealth: Payer: Self-pay | Admitting: Adult Health

## 2021-08-04 DIAGNOSIS — G4700 Insomnia, unspecified: Secondary | ICD-10-CM

## 2021-08-04 NOTE — Telephone Encounter (Signed)
Last filled 10/11 due 11/8

## 2021-08-04 NOTE — Telephone Encounter (Signed)
Pt called requesting Clonazepam Rx to [facility]. APT 12/2

## 2021-08-06 ENCOUNTER — Other Ambulatory Visit: Payer: Self-pay | Admitting: Adult Health

## 2021-08-07 NOTE — Telephone Encounter (Signed)
Last filled 10/11 appt on 12/2

## 2021-08-07 NOTE — Telephone Encounter (Signed)
Pt called requesting Clonazepam Rx to [facility]. APT 12/2. She has two left. Its still one day early. Just to inform she will need this refilled by tomorrow.

## 2021-08-08 DIAGNOSIS — E7800 Pure hypercholesterolemia, unspecified: Secondary | ICD-10-CM | POA: Diagnosis not present

## 2021-08-08 DIAGNOSIS — G4733 Obstructive sleep apnea (adult) (pediatric): Secondary | ICD-10-CM | POA: Diagnosis not present

## 2021-08-08 DIAGNOSIS — R946 Abnormal results of thyroid function studies: Secondary | ICD-10-CM | POA: Diagnosis not present

## 2021-08-08 DIAGNOSIS — K219 Gastro-esophageal reflux disease without esophagitis: Secondary | ICD-10-CM | POA: Diagnosis not present

## 2021-08-08 DIAGNOSIS — E039 Hypothyroidism, unspecified: Secondary | ICD-10-CM | POA: Diagnosis not present

## 2021-08-08 DIAGNOSIS — G4700 Insomnia, unspecified: Secondary | ICD-10-CM | POA: Diagnosis not present

## 2021-08-08 DIAGNOSIS — I10 Essential (primary) hypertension: Secondary | ICD-10-CM | POA: Diagnosis not present

## 2021-08-08 NOTE — Telephone Encounter (Signed)
This was sent yesterday.

## 2021-08-09 DIAGNOSIS — M4802 Spinal stenosis, cervical region: Secondary | ICD-10-CM | POA: Diagnosis not present

## 2021-08-09 DIAGNOSIS — M9902 Segmental and somatic dysfunction of thoracic region: Secondary | ICD-10-CM | POA: Diagnosis not present

## 2021-08-09 DIAGNOSIS — S29012A Strain of muscle and tendon of back wall of thorax, initial encounter: Secondary | ICD-10-CM | POA: Diagnosis not present

## 2021-08-09 DIAGNOSIS — M47812 Spondylosis without myelopathy or radiculopathy, cervical region: Secondary | ICD-10-CM | POA: Diagnosis not present

## 2021-08-09 DIAGNOSIS — M9903 Segmental and somatic dysfunction of lumbar region: Secondary | ICD-10-CM | POA: Diagnosis not present

## 2021-08-09 DIAGNOSIS — S338XXA Sprain of other parts of lumbar spine and pelvis, initial encounter: Secondary | ICD-10-CM | POA: Diagnosis not present

## 2021-08-09 DIAGNOSIS — M9904 Segmental and somatic dysfunction of sacral region: Secondary | ICD-10-CM | POA: Diagnosis not present

## 2021-08-09 DIAGNOSIS — M5136 Other intervertebral disc degeneration, lumbar region: Secondary | ICD-10-CM | POA: Diagnosis not present

## 2021-08-09 DIAGNOSIS — M9901 Segmental and somatic dysfunction of cervical region: Secondary | ICD-10-CM | POA: Diagnosis not present

## 2021-08-15 DIAGNOSIS — S338XXA Sprain of other parts of lumbar spine and pelvis, initial encounter: Secondary | ICD-10-CM | POA: Diagnosis not present

## 2021-08-15 DIAGNOSIS — M5136 Other intervertebral disc degeneration, lumbar region: Secondary | ICD-10-CM | POA: Diagnosis not present

## 2021-08-15 DIAGNOSIS — M4802 Spinal stenosis, cervical region: Secondary | ICD-10-CM | POA: Diagnosis not present

## 2021-08-15 DIAGNOSIS — M9901 Segmental and somatic dysfunction of cervical region: Secondary | ICD-10-CM | POA: Diagnosis not present

## 2021-08-15 DIAGNOSIS — M47812 Spondylosis without myelopathy or radiculopathy, cervical region: Secondary | ICD-10-CM | POA: Diagnosis not present

## 2021-08-15 DIAGNOSIS — S29012A Strain of muscle and tendon of back wall of thorax, initial encounter: Secondary | ICD-10-CM | POA: Diagnosis not present

## 2021-08-15 DIAGNOSIS — M9904 Segmental and somatic dysfunction of sacral region: Secondary | ICD-10-CM | POA: Diagnosis not present

## 2021-08-15 DIAGNOSIS — M9902 Segmental and somatic dysfunction of thoracic region: Secondary | ICD-10-CM | POA: Diagnosis not present

## 2021-08-15 DIAGNOSIS — M9903 Segmental and somatic dysfunction of lumbar region: Secondary | ICD-10-CM | POA: Diagnosis not present

## 2021-08-22 DIAGNOSIS — S338XXA Sprain of other parts of lumbar spine and pelvis, initial encounter: Secondary | ICD-10-CM | POA: Diagnosis not present

## 2021-08-22 DIAGNOSIS — M9903 Segmental and somatic dysfunction of lumbar region: Secondary | ICD-10-CM | POA: Diagnosis not present

## 2021-08-22 DIAGNOSIS — M5136 Other intervertebral disc degeneration, lumbar region: Secondary | ICD-10-CM | POA: Diagnosis not present

## 2021-08-22 DIAGNOSIS — M9904 Segmental and somatic dysfunction of sacral region: Secondary | ICD-10-CM | POA: Diagnosis not present

## 2021-08-22 DIAGNOSIS — M4802 Spinal stenosis, cervical region: Secondary | ICD-10-CM | POA: Diagnosis not present

## 2021-08-22 DIAGNOSIS — M9901 Segmental and somatic dysfunction of cervical region: Secondary | ICD-10-CM | POA: Diagnosis not present

## 2021-08-22 DIAGNOSIS — M9902 Segmental and somatic dysfunction of thoracic region: Secondary | ICD-10-CM | POA: Diagnosis not present

## 2021-08-22 DIAGNOSIS — M47812 Spondylosis without myelopathy or radiculopathy, cervical region: Secondary | ICD-10-CM | POA: Diagnosis not present

## 2021-08-22 DIAGNOSIS — S29012A Strain of muscle and tendon of back wall of thorax, initial encounter: Secondary | ICD-10-CM | POA: Diagnosis not present

## 2021-08-31 DIAGNOSIS — M1711 Unilateral primary osteoarthritis, right knee: Secondary | ICD-10-CM | POA: Diagnosis not present

## 2021-08-31 DIAGNOSIS — M19071 Primary osteoarthritis, right ankle and foot: Secondary | ICD-10-CM | POA: Diagnosis not present

## 2021-08-31 DIAGNOSIS — M5136 Other intervertebral disc degeneration, lumbar region: Secondary | ICD-10-CM | POA: Diagnosis not present

## 2021-08-31 DIAGNOSIS — G894 Chronic pain syndrome: Secondary | ICD-10-CM | POA: Diagnosis not present

## 2021-09-01 ENCOUNTER — Other Ambulatory Visit: Payer: Self-pay

## 2021-09-01 ENCOUNTER — Ambulatory Visit (INDEPENDENT_AMBULATORY_CARE_PROVIDER_SITE_OTHER): Payer: Medicare Other | Admitting: Adult Health

## 2021-09-01 ENCOUNTER — Encounter: Payer: Self-pay | Admitting: Adult Health

## 2021-09-01 ENCOUNTER — Other Ambulatory Visit: Payer: Self-pay | Admitting: Pulmonary Disease

## 2021-09-01 DIAGNOSIS — F411 Generalized anxiety disorder: Secondary | ICD-10-CM | POA: Diagnosis not present

## 2021-09-01 DIAGNOSIS — G4700 Insomnia, unspecified: Secondary | ICD-10-CM | POA: Diagnosis not present

## 2021-09-01 DIAGNOSIS — G4730 Sleep apnea, unspecified: Secondary | ICD-10-CM | POA: Diagnosis not present

## 2021-09-01 DIAGNOSIS — F331 Major depressive disorder, recurrent, moderate: Secondary | ICD-10-CM

## 2021-09-01 MED ORDER — ARMODAFINIL 200 MG PO TABS: 200.0000 mg | ORAL_TABLET | Freq: Every day | ORAL | 2 refills | Status: DC

## 2021-09-01 MED ORDER — CLONAZEPAM 1 MG PO TABS: ORAL_TABLET | ORAL | 2 refills | Status: DC

## 2021-09-01 MED ORDER — FLUOXETINE HCL 40 MG PO CAPS: ORAL_CAPSULE | ORAL | 5 refills | Status: DC

## 2021-09-01 NOTE — Progress Notes (Signed)
Olivia [name] [id number] [birth year] 70 y.o.  Subjective:   Patient ID:  Olivia [name] is a 70 y.o. (DOB [birth year]) female.  Chief Complaint: No chief complaint on file.   HPI Olivia [name] presents to the office today for follow-up of depression, anxiety, sleep apnea, and insomnia.  Describes mood today as "ok". Pleasant. Mood symptoms - reports anxiety. Denies depression and irritability. Feels like mood is consistent. Stating I'm doing alright". Would like to try and increase the Armodafinil from 150mg  to 250mg   Daily. Gets frustrated with feet hurting her. She and son doing well. Stable interest and motivation. Taking medications as prescribed.  Energy levels lower. Active, does not have a regular exercise routine. Enjoys some usual interests and activities. Single. Lives with son. Talking to with friends.  Appetite adequate. Weight gain. Sleeps better some nights than others. Using CPAP - has new machine. Focus and concentration difficulties. Completing tasks. Managing some aspects of household. Retired. Denies SI or HI.  Denies AH or VH.  Review of Systems:  Review of Systems  Musculoskeletal:  Negative for gait problem.  Neurological:  Negative for tremors.  Psychiatric/Behavioral:         Please refer to HPI   Medications: I have reviewed the patient's current medications.  Current Outpatient Medications  Medication Sig Dispense Refill   acetaminophen (TYLENOL) 500 MG tablet Take 2 tablets (1,000 mg total) by mouth every 6 (six) hours as needed for mild pain or moderate pain. 30 tablet 0   amLODipine (NORVASC) 5 MG tablet TAKE 1 TABLET(5 MG) BY MOUTH DAILY 30 tablet 5   aspirin EC 325 MG EC tablet Take 1 tablet (325 mg total) by mouth 2 (two) times daily. (Patient not taking: Reported on 01/04/2021) 30 tablet 0   atenolol (TENORMIN) 25 MG tablet Take 25 mg by mouth at bedtime.      azelastine (ASTELIN) 0.1 % nasal spray Place 2 sprays into both nostrils daily as needed for  rhinitis. Use in each nostril as directed 30 mL 3   azithromycin (ZITHROMAX) 250 MG tablet Take 2 tablets today then 1 tablet daily until gone 6 tablet 0   benzonatate (TESSALON) 100 MG capsule TAKE 1 CAPSULE(100 MG) BY MOUTH TWICE DAILY AS NEEDED FOR COUGH 60 capsule 2   CALCIUM-MAGNESIUM-VITAMIN D PO Take 1 tablet by mouth daily.     cetirizine (ZYRTEC ALLERGY) 10 MG tablet Take 1 tablet (10 mg total) by mouth daily. 30 tablet 1   clonazePAM (KLONOPIN) 1 MG tablet TAKE ONE TABLET BY MOUTH THREE TIMES DAILY AS NEEDED FOR ANXIETY 90 tablet 0   esomeprazole (NEXIUM) 40 MG capsule Take 40 mg by mouth 2 (two) times daily before a meal.      famotidine (PEPCID) 10 MG tablet Take 10 mg by mouth daily as needed for heartburn or indigestion.     FLUoxetine (PROZAC) 40 MG capsule TAKE 1 CAPSULE(40 MG) BY MOUTH DAILY 30 capsule 5   fluticasone (FLONASE) 50 MCG/ACT nasal spray Place 1 spray into both nostrils daily. 16 g 8   KLONOPIN 1 MG tablet TAKE 1 TABLET(1 MG) BY MOUTH THREE TIMES DAILY AS NEEDED FOR ANXIETY 90 tablet 0   lisinopril (PRINIVIL,ZESTRIL) 20 MG tablet Take 20 mg by mouth daily.  (Patient not taking: Reported on 01/04/2021)  1   methocarbamol (ROBAXIN) 500 MG tablet Take 1-2 tablets (500-1,000 mg total) by mouth every 6 (six) hours as needed for muscle spasms. 60 tablet 0   Multiple Vitamin (  MULITIVITAMIN WITH MINERALS) TABS Take 1 tablet by mouth daily.     predniSONE (DELTASONE) 20 MG tablet Take 40 mg daily x 5 days 10 tablet 0   sodium chloride (OCEAN) 0.65 % SOLN nasal spray Place 2 sprays into both nostrils as needed for congestion. 30 mL 2   SYNTHROID 125 MCG tablet Take 125 mcg by mouth at bedtime.      vitamin C (ASCORBIC ACID) 500 MG tablet Take 500 mg by mouth 2 (two) times a week.      No current facility-administered medications for this visit.    Medication Side Effects: None  Allergies:  Allergies  Allergen Reactions   Demerol [Meperidine] Nausea And Vomiting   Other      Other reaction(s): hives   Penicillins Other (See Comments)    Unknown childhood allergy Has patient had a PCN reaction causing immediate rash, facial/tongue/throat swelling, SOB or lightheadedness with hypotension: Unknown Has patient had a PCN reaction causing severe rash involving mucus membranes or skin necrosis: Unknown Has patient had a PCN reaction that required hospitalization: Unknown Has patient had a PCN reaction occurring within the last 10 years: No If all of the above answers are "NO", then may proceed with Cephalosporin use.     Past Medical History:  Diagnosis Date   Anxiety    [facility]   Arthritis    Cataract    THESE BEEN REPLACED   Depression    [facility]   Diverticulosis    Dry eye syndrome    GERD (gastroesophageal reflux disease)    History of colon polyps 12/03/2012   COLONOSCOPY   History of exercise stress test 2010   Dr. Ashok Norris    Hyperlipidemia    Hypertension    Hypothyroidism    Multinodular goiter    DR. KERR   OSA (obstructive sleep apnea)    (PSG 12/12/15 ESS 2, AHI 42/HR REM 30/HR, O2 MIN 80%)  CPAP- q night , done with [facility].  study done at [place]   Squamous cell carcinoma in situ 2015   Thyroid disease    Nodules   Tubular adenoma    DR. [place] [facility] [place]   Varicose veins     Past Medical History, Surgical history, Social history, and Family history were reviewed and updated as appropriate.   Please see review of systems for further details on the patient's review from today.   Objective:   Physical Exam:  There were no vitals taken for this visit.  Physical Exam Constitutional:      General: She is not in acute distress. Musculoskeletal:        General: No deformity.  Neurological:     Mental Status: She is alert and oriented to person, place, and time.     Coordination: Coordination normal.  Psychiatric:        Attention and Perception: Attention and perception normal. She does not  perceive auditory or visual hallucinations.        Mood and Affect: Mood normal. Mood is not anxious or depressed. Affect is not labile, blunt, angry or inappropriate.        Speech: Speech normal.        Behavior: Behavior normal.        Thought Content: Thought content normal. Thought content is not paranoid or delusional. Thought content does not include homicidal or suicidal ideation. Thought content does not include homicidal or suicidal plan.        Cognition and Memory:  Cognition and memory normal.        Judgment: Judgment normal.     Comments: Insight intact    Lab Review:     Component Value Date/Time   NA 130 (L) 12/01/2019 0321   K 4.2 12/01/2019 0321   CL 98 12/01/2019 0321   CO2 23 12/01/2019 0321   GLUCOSE 94 12/01/2019 0321   BUN 14 12/01/2019 0321   CREATININE 0.84 12/01/2019 0321   CALCIUM 8.3 (L) 12/01/2019 0321   PROT 7.4 11/24/2019 1450   ALBUMIN 4.0 11/24/2019 1450   AST 17 11/24/2019 1450   ALT 18 11/24/2019 1450   ALKPHOS 82 11/24/2019 1450   BILITOT 0.4 11/24/2019 1450   GFRNONAA >60 12/01/2019 0321   GFRAA >60 12/01/2019 0321       Component Value Date/Time   WBC 7.4 12/01/2019 0321   RBC 3.29 (L) 12/01/2019 0321   HGB 10.0 (L) 12/01/2019 0321   HCT 31.0 (L) 12/01/2019 0321   PLT 244 12/01/2019 0321   MCV 94.2 12/01/2019 0321   MCH 30.4 12/01/2019 0321   MCHC 32.3 12/01/2019 0321   RDW 15.8 (H) 12/01/2019 0321   LYMPHSABS 1.9 11/24/2019 1450   MONOABS 0.6 11/24/2019 1450   EOSABS 0.2 11/24/2019 1450   BASOSABS 0.0 11/24/2019 1450    No results found for: POCLITH, LITHIUM   No results found for: PHENYTOIN, PHENOBARB, VALPROATE, CBMZ   .res Assessment: Plan:    Plan:  1. Prozac 40mg  daily 2. Clonazepam 1mg  TID 3. Increase Nuvigil 150mg  to  200mg  tablet every morning for sleep apnea  RTC 3 months  Patient advised to contact office with any questions, adverse effects, or acute worsening in signs and symptoms.  Discussed potential  benefits, risk, and side effects of benzodiazepines to include potential risk of tolerance and dependence, as well as possible drowsiness.  Advised patient not to drive if experiencing drowsiness and to take lowest possible effective dose to minimize risk of dependence and tolerance.  Diagnoses and all orders for this visit:  Sleep apnea, unspecified type  Insomnia, unspecified type  Generalized anxiety disorder  Major depressive disorder, recurrent episode, moderate ([place])    Please see After Visit Summary for patient specific instructions.  No future appointments.  No orders of the defined types were placed in this encounter.   -------------------------------

## 2021-09-06 DIAGNOSIS — M47812 Spondylosis without myelopathy or radiculopathy, cervical region: Secondary | ICD-10-CM | POA: Diagnosis not present

## 2021-09-06 DIAGNOSIS — M9903 Segmental and somatic dysfunction of lumbar region: Secondary | ICD-10-CM | POA: Diagnosis not present

## 2021-09-06 DIAGNOSIS — S29012A Strain of muscle and tendon of back wall of thorax, initial encounter: Secondary | ICD-10-CM | POA: Diagnosis not present

## 2021-09-06 DIAGNOSIS — M5136 Other intervertebral disc degeneration, lumbar region: Secondary | ICD-10-CM | POA: Diagnosis not present

## 2021-09-06 DIAGNOSIS — M9901 Segmental and somatic dysfunction of cervical region: Secondary | ICD-10-CM | POA: Diagnosis not present

## 2021-09-06 DIAGNOSIS — M4802 Spinal stenosis, cervical region: Secondary | ICD-10-CM | POA: Diagnosis not present

## 2021-09-06 DIAGNOSIS — S338XXA Sprain of other parts of lumbar spine and pelvis, initial encounter: Secondary | ICD-10-CM | POA: Diagnosis not present

## 2021-09-06 DIAGNOSIS — M9904 Segmental and somatic dysfunction of sacral region: Secondary | ICD-10-CM | POA: Diagnosis not present

## 2021-09-06 DIAGNOSIS — M9902 Segmental and somatic dysfunction of thoracic region: Secondary | ICD-10-CM | POA: Diagnosis not present

## 2021-09-12 DIAGNOSIS — S29012A Strain of muscle and tendon of back wall of thorax, initial encounter: Secondary | ICD-10-CM | POA: Diagnosis not present

## 2021-09-12 DIAGNOSIS — S338XXA Sprain of other parts of lumbar spine and pelvis, initial encounter: Secondary | ICD-10-CM | POA: Diagnosis not present

## 2021-09-12 DIAGNOSIS — M9903 Segmental and somatic dysfunction of lumbar region: Secondary | ICD-10-CM | POA: Diagnosis not present

## 2021-09-12 DIAGNOSIS — M47812 Spondylosis without myelopathy or radiculopathy, cervical region: Secondary | ICD-10-CM | POA: Diagnosis not present

## 2021-09-12 DIAGNOSIS — M9904 Segmental and somatic dysfunction of sacral region: Secondary | ICD-10-CM | POA: Diagnosis not present

## 2021-09-12 DIAGNOSIS — M9902 Segmental and somatic dysfunction of thoracic region: Secondary | ICD-10-CM | POA: Diagnosis not present

## 2021-09-12 DIAGNOSIS — M5136 Other intervertebral disc degeneration, lumbar region: Secondary | ICD-10-CM | POA: Diagnosis not present

## 2021-09-12 DIAGNOSIS — M9901 Segmental and somatic dysfunction of cervical region: Secondary | ICD-10-CM | POA: Diagnosis not present

## 2021-09-12 DIAGNOSIS — M4802 Spinal stenosis, cervical region: Secondary | ICD-10-CM | POA: Diagnosis not present

## 2021-09-28 DIAGNOSIS — S29012A Strain of muscle and tendon of back wall of thorax, initial encounter: Secondary | ICD-10-CM | POA: Diagnosis not present

## 2021-09-28 DIAGNOSIS — S338XXA Sprain of other parts of lumbar spine and pelvis, initial encounter: Secondary | ICD-10-CM | POA: Diagnosis not present

## 2021-09-28 DIAGNOSIS — M47812 Spondylosis without myelopathy or radiculopathy, cervical region: Secondary | ICD-10-CM | POA: Diagnosis not present

## 2021-09-28 DIAGNOSIS — M5136 Other intervertebral disc degeneration, lumbar region: Secondary | ICD-10-CM | POA: Diagnosis not present

## 2021-09-28 DIAGNOSIS — M9903 Segmental and somatic dysfunction of lumbar region: Secondary | ICD-10-CM | POA: Diagnosis not present

## 2021-09-28 DIAGNOSIS — M9902 Segmental and somatic dysfunction of thoracic region: Secondary | ICD-10-CM | POA: Diagnosis not present

## 2021-09-28 DIAGNOSIS — M9901 Segmental and somatic dysfunction of cervical region: Secondary | ICD-10-CM | POA: Diagnosis not present

## 2021-09-28 DIAGNOSIS — M4802 Spinal stenosis, cervical region: Secondary | ICD-10-CM | POA: Diagnosis not present

## 2021-09-28 DIAGNOSIS — M9904 Segmental and somatic dysfunction of sacral region: Secondary | ICD-10-CM | POA: Diagnosis not present

## 2021-10-05 DIAGNOSIS — M9901 Segmental and somatic dysfunction of cervical region: Secondary | ICD-10-CM | POA: Diagnosis not present

## 2021-10-05 DIAGNOSIS — S29012A Strain of muscle and tendon of back wall of thorax, initial encounter: Secondary | ICD-10-CM | POA: Diagnosis not present

## 2021-10-05 DIAGNOSIS — M47817 Spondylosis without myelopathy or radiculopathy, lumbosacral region: Secondary | ICD-10-CM | POA: Diagnosis not present

## 2021-10-05 DIAGNOSIS — M47812 Spondylosis without myelopathy or radiculopathy, cervical region: Secondary | ICD-10-CM | POA: Diagnosis not present

## 2021-10-05 DIAGNOSIS — M5030 Other cervical disc degeneration, unspecified cervical region: Secondary | ICD-10-CM | POA: Diagnosis not present

## 2021-10-05 DIAGNOSIS — M9902 Segmental and somatic dysfunction of thoracic region: Secondary | ICD-10-CM | POA: Diagnosis not present

## 2021-10-05 DIAGNOSIS — S338XXA Sprain of other parts of lumbar spine and pelvis, initial encounter: Secondary | ICD-10-CM | POA: Diagnosis not present

## 2021-10-05 DIAGNOSIS — M1711 Unilateral primary osteoarthritis, right knee: Secondary | ICD-10-CM | POA: Diagnosis not present

## 2021-10-05 DIAGNOSIS — M4802 Spinal stenosis, cervical region: Secondary | ICD-10-CM | POA: Diagnosis not present

## 2021-10-05 DIAGNOSIS — M9903 Segmental and somatic dysfunction of lumbar region: Secondary | ICD-10-CM | POA: Diagnosis not present

## 2021-10-05 DIAGNOSIS — M9904 Segmental and somatic dysfunction of sacral region: Secondary | ICD-10-CM | POA: Diagnosis not present

## 2021-10-05 DIAGNOSIS — M5136 Other intervertebral disc degeneration, lumbar region: Secondary | ICD-10-CM | POA: Diagnosis not present

## 2021-10-08 DIAGNOSIS — Z20822 Contact with and (suspected) exposure to covid-19: Secondary | ICD-10-CM | POA: Diagnosis not present

## 2021-10-09 DIAGNOSIS — G4733 Obstructive sleep apnea (adult) (pediatric): Secondary | ICD-10-CM | POA: Diagnosis not present

## 2021-10-18 DIAGNOSIS — M5136 Other intervertebral disc degeneration, lumbar region: Secondary | ICD-10-CM | POA: Diagnosis not present

## 2021-10-18 DIAGNOSIS — S338XXA Sprain of other parts of lumbar spine and pelvis, initial encounter: Secondary | ICD-10-CM | POA: Diagnosis not present

## 2021-10-18 DIAGNOSIS — M9903 Segmental and somatic dysfunction of lumbar region: Secondary | ICD-10-CM | POA: Diagnosis not present

## 2021-10-18 DIAGNOSIS — M9902 Segmental and somatic dysfunction of thoracic region: Secondary | ICD-10-CM | POA: Diagnosis not present

## 2021-10-18 DIAGNOSIS — M9901 Segmental and somatic dysfunction of cervical region: Secondary | ICD-10-CM | POA: Diagnosis not present

## 2021-10-18 DIAGNOSIS — S29012A Strain of muscle and tendon of back wall of thorax, initial encounter: Secondary | ICD-10-CM | POA: Diagnosis not present

## 2021-10-18 DIAGNOSIS — M4802 Spinal stenosis, cervical region: Secondary | ICD-10-CM | POA: Diagnosis not present

## 2021-10-18 DIAGNOSIS — M9904 Segmental and somatic dysfunction of sacral region: Secondary | ICD-10-CM | POA: Diagnosis not present

## 2021-10-18 DIAGNOSIS — M47812 Spondylosis without myelopathy or radiculopathy, cervical region: Secondary | ICD-10-CM | POA: Diagnosis not present

## 2021-10-19 DIAGNOSIS — R635 Abnormal weight gain: Secondary | ICD-10-CM | POA: Diagnosis not present

## 2021-10-19 DIAGNOSIS — E049 Nontoxic goiter, unspecified: Secondary | ICD-10-CM | POA: Diagnosis not present

## 2021-10-19 DIAGNOSIS — E039 Hypothyroidism, unspecified: Secondary | ICD-10-CM | POA: Diagnosis not present

## 2021-10-20 ENCOUNTER — Other Ambulatory Visit: Payer: Self-pay | Admitting: Endocrinology

## 2021-10-20 DIAGNOSIS — E049 Nontoxic goiter, unspecified: Secondary | ICD-10-CM

## 2021-10-30 ENCOUNTER — Ambulatory Visit
Admission: RE | Admit: 2021-10-30 | Discharge: 2021-10-30 | Disposition: A | Discharge status: Home / Self Care | Payer: Medicare Other | Source: Ambulatory Visit | Attending: Endocrinology | Admitting: Endocrinology

## 2021-10-30 DIAGNOSIS — E049 Nontoxic goiter, unspecified: Secondary | ICD-10-CM

## 2021-10-30 DIAGNOSIS — E041 Nontoxic single thyroid nodule: Secondary | ICD-10-CM | POA: Diagnosis not present

## 2021-10-31 DIAGNOSIS — M9903 Segmental and somatic dysfunction of lumbar region: Secondary | ICD-10-CM | POA: Diagnosis not present

## 2021-10-31 DIAGNOSIS — M4802 Spinal stenosis, cervical region: Secondary | ICD-10-CM | POA: Diagnosis not present

## 2021-10-31 DIAGNOSIS — M9904 Segmental and somatic dysfunction of sacral region: Secondary | ICD-10-CM | POA: Diagnosis not present

## 2021-10-31 DIAGNOSIS — S338XXA Sprain of other parts of lumbar spine and pelvis, initial encounter: Secondary | ICD-10-CM | POA: Diagnosis not present

## 2021-10-31 DIAGNOSIS — S29012A Strain of muscle and tendon of back wall of thorax, initial encounter: Secondary | ICD-10-CM | POA: Diagnosis not present

## 2021-10-31 DIAGNOSIS — M9901 Segmental and somatic dysfunction of cervical region: Secondary | ICD-10-CM | POA: Diagnosis not present

## 2021-10-31 DIAGNOSIS — M47812 Spondylosis without myelopathy or radiculopathy, cervical region: Secondary | ICD-10-CM | POA: Diagnosis not present

## 2021-10-31 DIAGNOSIS — M5136 Other intervertebral disc degeneration, lumbar region: Secondary | ICD-10-CM | POA: Diagnosis not present

## 2021-10-31 DIAGNOSIS — M9902 Segmental and somatic dysfunction of thoracic region: Secondary | ICD-10-CM | POA: Diagnosis not present

## 2021-11-02 ENCOUNTER — Other Ambulatory Visit: Payer: Self-pay | Admitting: Adult Health

## 2021-11-02 ENCOUNTER — Telehealth: Payer: Self-pay | Admitting: Adult Health

## 2021-11-02 DIAGNOSIS — M47817 Spondylosis without myelopathy or radiculopathy, lumbosacral region: Secondary | ICD-10-CM | POA: Diagnosis not present

## 2021-11-02 DIAGNOSIS — F331 Major depressive disorder, recurrent, moderate: Secondary | ICD-10-CM

## 2021-11-02 DIAGNOSIS — M542 Cervicalgia: Secondary | ICD-10-CM | POA: Diagnosis not present

## 2021-11-02 DIAGNOSIS — F411 Generalized anxiety disorder: Secondary | ICD-10-CM

## 2021-11-02 DIAGNOSIS — G4700 Insomnia, unspecified: Secondary | ICD-10-CM

## 2021-11-02 DIAGNOSIS — G8929 Other chronic pain: Secondary | ICD-10-CM | POA: Diagnosis not present

## 2021-11-02 DIAGNOSIS — M19071 Primary osteoarthritis, right ankle and foot: Secondary | ICD-10-CM | POA: Diagnosis not present

## 2021-11-02 MED ORDER — CLONAZEPAM 1 MG PO TABS: ORAL_TABLET | ORAL | 2 refills | Status: DC

## 2021-11-02 MED ORDER — FLUOXETINE HCL 40 MG PO CAPS: ORAL_CAPSULE | ORAL | 5 refills | Status: DC

## 2021-11-02 NOTE — Telephone Encounter (Signed)
Prior Authorization submitted and approved for KLONOPIN 1 MG effective 10/01/2021-11/02/2022 with [facility] Part [facility]   ID# [id number]  Will work on PA for Prozac 40 mg

## 2021-11-02 NOTE — Telephone Encounter (Signed)
Called and spoke with patient.

## 2021-11-02 NOTE — Telephone Encounter (Signed)
Pt called and lm  today at 10:15am. She is out of Klonopin and Prozac. Pharmacy told her they can't fill them. I called [facility] to confirm this. Pharmacy said denied for non-formulary. Called [facility] [phone] and confirmed that both Klonopin and Prozac are non-formulary. However, generic Klonopin should be ok with out a PA. Need PA for Prozac. If you want to get a PA call [phone], Her ID # [id number]. Can try resending the clonazepam to pharmacy to see if it goes thru. Otherwise, need to make arrangements for pt since she is completely out. [phone]. Thanks.

## 2021-11-02 NOTE — Telephone Encounter (Signed)
Multiple calls have been tried to pt but goes straight to voicemail.

## 2021-11-06 ENCOUNTER — Other Ambulatory Visit: Payer: Self-pay

## 2021-11-06 MED ORDER — CLONAZEPAM 1 MG PO TABS: ORAL_TABLET | ORAL | 0 refills | Status: DC

## 2021-11-06 NOTE — Telephone Encounter (Signed)
We did that so she wouldn't go into withdrawal.

## 2021-11-14 ENCOUNTER — Telehealth: Payer: Self-pay

## 2021-11-14 NOTE — Telephone Encounter (Signed)
Submitted PA for Fluoxetine 40 mg capsule with [employer] but response back was pt already has access to medication and no PA needed.

## 2021-11-16 DIAGNOSIS — M9902 Segmental and somatic dysfunction of thoracic region: Secondary | ICD-10-CM | POA: Diagnosis not present

## 2021-11-16 DIAGNOSIS — M4802 Spinal stenosis, cervical region: Secondary | ICD-10-CM | POA: Diagnosis not present

## 2021-11-16 DIAGNOSIS — M9903 Segmental and somatic dysfunction of lumbar region: Secondary | ICD-10-CM | POA: Diagnosis not present

## 2021-11-16 DIAGNOSIS — M9901 Segmental and somatic dysfunction of cervical region: Secondary | ICD-10-CM | POA: Diagnosis not present

## 2021-11-16 DIAGNOSIS — S29012A Strain of muscle and tendon of back wall of thorax, initial encounter: Secondary | ICD-10-CM | POA: Diagnosis not present

## 2021-11-16 DIAGNOSIS — M5136 Other intervertebral disc degeneration, lumbar region: Secondary | ICD-10-CM | POA: Diagnosis not present

## 2021-11-16 DIAGNOSIS — M47812 Spondylosis without myelopathy or radiculopathy, cervical region: Secondary | ICD-10-CM | POA: Diagnosis not present

## 2021-11-16 DIAGNOSIS — M9904 Segmental and somatic dysfunction of sacral region: Secondary | ICD-10-CM | POA: Diagnosis not present

## 2021-11-16 DIAGNOSIS — S338XXA Sprain of other parts of lumbar spine and pelvis, initial encounter: Secondary | ICD-10-CM | POA: Diagnosis not present

## 2021-11-22 DIAGNOSIS — M4802 Spinal stenosis, cervical region: Secondary | ICD-10-CM | POA: Diagnosis not present

## 2021-11-22 DIAGNOSIS — M47812 Spondylosis without myelopathy or radiculopathy, cervical region: Secondary | ICD-10-CM | POA: Diagnosis not present

## 2021-11-22 DIAGNOSIS — M9902 Segmental and somatic dysfunction of thoracic region: Secondary | ICD-10-CM | POA: Diagnosis not present

## 2021-11-22 DIAGNOSIS — M9901 Segmental and somatic dysfunction of cervical region: Secondary | ICD-10-CM | POA: Diagnosis not present

## 2021-11-22 DIAGNOSIS — M5136 Other intervertebral disc degeneration, lumbar region: Secondary | ICD-10-CM | POA: Diagnosis not present

## 2021-11-22 DIAGNOSIS — S29012A Strain of muscle and tendon of back wall of thorax, initial encounter: Secondary | ICD-10-CM | POA: Diagnosis not present

## 2021-11-22 DIAGNOSIS — M9904 Segmental and somatic dysfunction of sacral region: Secondary | ICD-10-CM | POA: Diagnosis not present

## 2021-11-22 DIAGNOSIS — M9903 Segmental and somatic dysfunction of lumbar region: Secondary | ICD-10-CM | POA: Diagnosis not present

## 2021-11-22 DIAGNOSIS — S338XXA Sprain of other parts of lumbar spine and pelvis, initial encounter: Secondary | ICD-10-CM | POA: Diagnosis not present

## 2021-11-24 DIAGNOSIS — M5136 Other intervertebral disc degeneration, lumbar region: Secondary | ICD-10-CM | POA: Diagnosis not present

## 2021-11-24 DIAGNOSIS — M9904 Segmental and somatic dysfunction of sacral region: Secondary | ICD-10-CM | POA: Diagnosis not present

## 2021-11-24 DIAGNOSIS — S29012A Strain of muscle and tendon of back wall of thorax, initial encounter: Secondary | ICD-10-CM | POA: Diagnosis not present

## 2021-11-24 DIAGNOSIS — M4802 Spinal stenosis, cervical region: Secondary | ICD-10-CM | POA: Diagnosis not present

## 2021-11-24 DIAGNOSIS — M9901 Segmental and somatic dysfunction of cervical region: Secondary | ICD-10-CM | POA: Diagnosis not present

## 2021-11-24 DIAGNOSIS — S338XXA Sprain of other parts of lumbar spine and pelvis, initial encounter: Secondary | ICD-10-CM | POA: Diagnosis not present

## 2021-11-24 DIAGNOSIS — M9903 Segmental and somatic dysfunction of lumbar region: Secondary | ICD-10-CM | POA: Diagnosis not present

## 2021-11-24 DIAGNOSIS — M9902 Segmental and somatic dysfunction of thoracic region: Secondary | ICD-10-CM | POA: Diagnosis not present

## 2021-11-24 DIAGNOSIS — M47812 Spondylosis without myelopathy or radiculopathy, cervical region: Secondary | ICD-10-CM | POA: Diagnosis not present

## 2021-11-27 ENCOUNTER — Other Ambulatory Visit: Payer: Self-pay | Admitting: Pulmonary Disease

## 2021-11-27 MED ORDER — CETIRIZINE HCL 10 MG PO TABS: ORAL_TABLET | ORAL | 0 refills | Status: DC

## 2021-11-30 ENCOUNTER — Ambulatory Visit: Payer: Medicare Other | Admitting: Adult Health

## 2021-12-01 ENCOUNTER — Other Ambulatory Visit: Payer: Self-pay | Admitting: Adult Health

## 2021-12-01 DIAGNOSIS — G4700 Insomnia, unspecified: Secondary | ICD-10-CM

## 2021-12-01 MED ORDER — ARMODAFINIL 200 MG PO TABS: 200.0000 mg | ORAL_TABLET | Freq: Every day | ORAL | 1 refills | Status: DC

## 2021-12-01 NOTE — Telephone Encounter (Signed)
Next visit is 12/21/21. Almeter called and said when she went to pick up her Armodafinil it had only 5 pills in the bottle. The pharmacist said it was sent in this way. She has already taken 1 pill from the bottle as she was totally out. Is this what it is supposed to be or is this an error? Phone number is [phone] ? ?Pharmacy is: ? ?[id number] - [place], [place] ? ?Phone:  [phone]  ?Fax:  [phone]  ? ? ? ? ? ? ? ?

## 2021-12-01 NOTE — Telephone Encounter (Signed)
Yes bc I never sent a qty of 5.

## 2021-12-01 NOTE — Telephone Encounter (Signed)
Rx sent in December expired.Pharmacist is not sure how but rx was received as only 5 tabs.Can we just send a new rx?

## 2021-12-04 DIAGNOSIS — M9903 Segmental and somatic dysfunction of lumbar region: Secondary | ICD-10-CM | POA: Diagnosis not present

## 2021-12-04 DIAGNOSIS — S338XXA Sprain of other parts of lumbar spine and pelvis, initial encounter: Secondary | ICD-10-CM | POA: Diagnosis not present

## 2021-12-04 DIAGNOSIS — M9904 Segmental and somatic dysfunction of sacral region: Secondary | ICD-10-CM | POA: Diagnosis not present

## 2021-12-04 DIAGNOSIS — M4802 Spinal stenosis, cervical region: Secondary | ICD-10-CM | POA: Diagnosis not present

## 2021-12-04 DIAGNOSIS — M47812 Spondylosis without myelopathy or radiculopathy, cervical region: Secondary | ICD-10-CM | POA: Diagnosis not present

## 2021-12-04 DIAGNOSIS — M5136 Other intervertebral disc degeneration, lumbar region: Secondary | ICD-10-CM | POA: Diagnosis not present

## 2021-12-04 DIAGNOSIS — M9901 Segmental and somatic dysfunction of cervical region: Secondary | ICD-10-CM | POA: Diagnosis not present

## 2021-12-04 DIAGNOSIS — S29012A Strain of muscle and tendon of back wall of thorax, initial encounter: Secondary | ICD-10-CM | POA: Diagnosis not present

## 2021-12-04 DIAGNOSIS — M9902 Segmental and somatic dysfunction of thoracic region: Secondary | ICD-10-CM | POA: Diagnosis not present

## 2021-12-12 DIAGNOSIS — E039 Hypothyroidism, unspecified: Secondary | ICD-10-CM | POA: Diagnosis not present

## 2021-12-13 DIAGNOSIS — Z79891 Long term (current) use of opiate analgesic: Secondary | ICD-10-CM | POA: Diagnosis not present

## 2021-12-13 DIAGNOSIS — M5416 Radiculopathy, lumbar region: Secondary | ICD-10-CM | POA: Diagnosis not present

## 2021-12-13 DIAGNOSIS — G894 Chronic pain syndrome: Secondary | ICD-10-CM | POA: Diagnosis not present

## 2021-12-20 DIAGNOSIS — M9903 Segmental and somatic dysfunction of lumbar region: Secondary | ICD-10-CM | POA: Diagnosis not present

## 2021-12-20 DIAGNOSIS — M4802 Spinal stenosis, cervical region: Secondary | ICD-10-CM | POA: Diagnosis not present

## 2021-12-20 DIAGNOSIS — S338XXA Sprain of other parts of lumbar spine and pelvis, initial encounter: Secondary | ICD-10-CM | POA: Diagnosis not present

## 2021-12-20 DIAGNOSIS — Z20822 Contact with and (suspected) exposure to covid-19: Secondary | ICD-10-CM | POA: Diagnosis not present

## 2021-12-20 DIAGNOSIS — M9901 Segmental and somatic dysfunction of cervical region: Secondary | ICD-10-CM | POA: Diagnosis not present

## 2021-12-20 DIAGNOSIS — M9904 Segmental and somatic dysfunction of sacral region: Secondary | ICD-10-CM | POA: Diagnosis not present

## 2021-12-20 DIAGNOSIS — M5136 Other intervertebral disc degeneration, lumbar region: Secondary | ICD-10-CM | POA: Diagnosis not present

## 2021-12-20 DIAGNOSIS — M9902 Segmental and somatic dysfunction of thoracic region: Secondary | ICD-10-CM | POA: Diagnosis not present

## 2021-12-20 DIAGNOSIS — M47812 Spondylosis without myelopathy or radiculopathy, cervical region: Secondary | ICD-10-CM | POA: Diagnosis not present

## 2021-12-20 DIAGNOSIS — S29012A Strain of muscle and tendon of back wall of thorax, initial encounter: Secondary | ICD-10-CM | POA: Diagnosis not present

## 2021-12-21 ENCOUNTER — Ambulatory Visit: Payer: Medicare Other | Admitting: Adult Health

## 2021-12-28 DIAGNOSIS — M9902 Segmental and somatic dysfunction of thoracic region: Secondary | ICD-10-CM | POA: Diagnosis not present

## 2021-12-28 DIAGNOSIS — M5136 Other intervertebral disc degeneration, lumbar region: Secondary | ICD-10-CM | POA: Diagnosis not present

## 2021-12-28 DIAGNOSIS — M9904 Segmental and somatic dysfunction of sacral region: Secondary | ICD-10-CM | POA: Diagnosis not present

## 2021-12-28 DIAGNOSIS — M9901 Segmental and somatic dysfunction of cervical region: Secondary | ICD-10-CM | POA: Diagnosis not present

## 2021-12-28 DIAGNOSIS — M4802 Spinal stenosis, cervical region: Secondary | ICD-10-CM | POA: Diagnosis not present

## 2021-12-28 DIAGNOSIS — M9903 Segmental and somatic dysfunction of lumbar region: Secondary | ICD-10-CM | POA: Diagnosis not present

## 2021-12-28 DIAGNOSIS — S29012A Strain of muscle and tendon of back wall of thorax, initial encounter: Secondary | ICD-10-CM | POA: Diagnosis not present

## 2021-12-28 DIAGNOSIS — S338XXA Sprain of other parts of lumbar spine and pelvis, initial encounter: Secondary | ICD-10-CM | POA: Diagnosis not present

## 2021-12-28 DIAGNOSIS — M47812 Spondylosis without myelopathy or radiculopathy, cervical region: Secondary | ICD-10-CM | POA: Diagnosis not present

## 2022-01-03 ENCOUNTER — Telehealth: Payer: Self-pay | Admitting: Adult Health

## 2022-01-03 ENCOUNTER — Other Ambulatory Visit: Payer: Self-pay | Admitting: Adult Health

## 2022-01-03 DIAGNOSIS — F411 Generalized anxiety disorder: Secondary | ICD-10-CM

## 2022-01-03 MED ORDER — KLONOPIN 1 MG PO TABS: ORAL_TABLET | ORAL | 2 refills | Status: DC

## 2022-01-03 NOTE — Telephone Encounter (Signed)
Olivia [name] is requesting a refill on her Klonopin. She took her last pill today. Please call into: ? ?[id number] - [place], [place] ? ?Phone:  [phone]  ?Fax:  [phone]  ? ?

## 2022-01-03 NOTE — Telephone Encounter (Signed)
Script sent  

## 2022-01-04 DIAGNOSIS — S29012A Strain of muscle and tendon of back wall of thorax, initial encounter: Secondary | ICD-10-CM | POA: Diagnosis not present

## 2022-01-04 DIAGNOSIS — S338XXA Sprain of other parts of lumbar spine and pelvis, initial encounter: Secondary | ICD-10-CM | POA: Diagnosis not present

## 2022-01-04 DIAGNOSIS — M5136 Other intervertebral disc degeneration, lumbar region: Secondary | ICD-10-CM | POA: Diagnosis not present

## 2022-01-04 DIAGNOSIS — M47812 Spondylosis without myelopathy or radiculopathy, cervical region: Secondary | ICD-10-CM | POA: Diagnosis not present

## 2022-01-04 DIAGNOSIS — M4802 Spinal stenosis, cervical region: Secondary | ICD-10-CM | POA: Diagnosis not present

## 2022-01-04 DIAGNOSIS — M9902 Segmental and somatic dysfunction of thoracic region: Secondary | ICD-10-CM | POA: Diagnosis not present

## 2022-01-04 DIAGNOSIS — M9904 Segmental and somatic dysfunction of sacral region: Secondary | ICD-10-CM | POA: Diagnosis not present

## 2022-01-04 DIAGNOSIS — M9901 Segmental and somatic dysfunction of cervical region: Secondary | ICD-10-CM | POA: Diagnosis not present

## 2022-01-04 DIAGNOSIS — M9903 Segmental and somatic dysfunction of lumbar region: Secondary | ICD-10-CM | POA: Diagnosis not present

## 2022-01-08 DIAGNOSIS — Z0130 Encounter for examination of blood pressure without abnormal findings: Secondary | ICD-10-CM | POA: Diagnosis not present

## 2022-01-08 DIAGNOSIS — Z6838 Body mass index (BMI) 38.0-38.9, adult: Secondary | ICD-10-CM | POA: Diagnosis not present

## 2022-01-08 DIAGNOSIS — M129 Arthropathy, unspecified: Secondary | ICD-10-CM | POA: Diagnosis not present

## 2022-01-08 DIAGNOSIS — M549 Dorsalgia, unspecified: Secondary | ICD-10-CM | POA: Diagnosis not present

## 2022-01-08 DIAGNOSIS — G8929 Other chronic pain: Secondary | ICD-10-CM | POA: Diagnosis not present

## 2022-01-08 DIAGNOSIS — Z131 Encounter for screening for diabetes mellitus: Secondary | ICD-10-CM | POA: Diagnosis not present

## 2022-01-08 DIAGNOSIS — Z114 Encounter for screening for human immunodeficiency virus [HIV]: Secondary | ICD-10-CM | POA: Diagnosis not present

## 2022-01-08 DIAGNOSIS — E559 Vitamin D deficiency, unspecified: Secondary | ICD-10-CM | POA: Diagnosis not present

## 2022-01-08 DIAGNOSIS — R5383 Other fatigue: Secondary | ICD-10-CM | POA: Diagnosis not present

## 2022-01-08 DIAGNOSIS — Z0000 Encounter for general adult medical examination without abnormal findings: Secondary | ICD-10-CM | POA: Diagnosis not present

## 2022-01-08 DIAGNOSIS — Z1159 Encounter for screening for other viral diseases: Secondary | ICD-10-CM | POA: Diagnosis not present

## 2022-01-08 DIAGNOSIS — Z79899 Other long term (current) drug therapy: Secondary | ICD-10-CM | POA: Diagnosis not present

## 2022-01-09 ENCOUNTER — Encounter: Payer: Self-pay | Admitting: Adult Health

## 2022-01-09 ENCOUNTER — Ambulatory Visit (INDEPENDENT_AMBULATORY_CARE_PROVIDER_SITE_OTHER): Payer: Medicare Other | Admitting: Adult Health

## 2022-01-09 DIAGNOSIS — F331 Major depressive disorder, recurrent, moderate: Secondary | ICD-10-CM | POA: Diagnosis not present

## 2022-01-09 DIAGNOSIS — G4730 Sleep apnea, unspecified: Secondary | ICD-10-CM

## 2022-01-09 DIAGNOSIS — F411 Generalized anxiety disorder: Secondary | ICD-10-CM

## 2022-01-09 DIAGNOSIS — G4700 Insomnia, unspecified: Secondary | ICD-10-CM

## 2022-01-09 MED ORDER — KLONOPIN 1 MG PO TABS: ORAL_TABLET | ORAL | 2 refills | Status: DC

## 2022-01-09 MED ORDER — ARMODAFINIL 200 MG PO TABS: 200.0000 mg | ORAL_TABLET | Freq: Every day | ORAL | 2 refills | Status: DC

## 2022-01-09 NOTE — Progress Notes (Signed)
Olivia [name] ?[id number] ?1951/03/09 ?71 y.o. ? ?Subjective:  ? ?Patient ID:  Olivia [name] is a 71 y.o. (DOB August 25, 1951) female. ? ?Chief Complaint: No chief complaint on file. ? ? ?HPI ?Olivia [name] presents to the office today for follow-up of depression, anxiety, sleep apnea, and insomnia. ? ?Describes mood today as "not good". Pleasant. Mood symptoms - reports anxiety, depression and irritability. Mood is lower. Stating I'm not doing as well as I was". Increased worry - "more so about son". Working with a new pain management clinic. Concerned about son. Stable interest and motivation. Taking medications as prescribed. ?Energy levels lower - feels tired. Active, does not have a regular exercise routine. ?Enjoys some usual interests and activities. Single. Lives with son. Talking to with friends.  ?Appetite adequate. Weight stable. ?Sleeps better some nights than others. Averages 9 hours. Using CPAP - has new machine. ?Focus and concentration difficulties at times. Completing tasks. Managing some aspects of household. Retired. ?Denies SI or HI.  ?Denies AH or VH.  ? ?Review of Systems:  ?Review of Systems  ?Musculoskeletal:  Negative for gait problem.  ?Neurological:  Negative for tremors.  ?Psychiatric/Behavioral:    ?     Please refer to HPI  ? ?Medications: I have reviewed the patient's current medications. ? ?Current Outpatient Medications  ?Medication Sig Dispense Refill  ? acetaminophen (TYLENOL) 500 MG tablet Take 2 tablets (1,000 mg total) by mouth every 6 (six) hours as needed for mild pain or moderate pain. 30 tablet 0  ? amLODipine (NORVASC) 5 MG tablet TAKE 1 TABLET(5 MG) BY MOUTH DAILY 30 tablet 5  ? Armodafinil 200 MG TABS Take 200 mg by mouth daily for 1 dose. 30 tablet 2  ? aspirin EC 325 MG EC tablet Take 1 tablet (325 mg total) by mouth 2 (two) times daily. (Patient not taking: Reported on 01/04/2021) 30 tablet 0  ? atenolol (TENORMIN) 25 MG tablet Take 25 mg by mouth at bedtime.     ? azelastine  (ASTELIN) 0.1 % nasal spray Place 2 sprays into both nostrils daily as needed for rhinitis. Use in each nostril as directed 30 mL 3  ? azithromycin (ZITHROMAX) 250 MG tablet Take 2 tablets today then 1 tablet daily until gone 6 tablet 0  ? benzonatate (TESSALON) 100 MG capsule TAKE 1 CAPSULE(100 MG) BY MOUTH TWICE DAILY AS NEEDED FOR COUGH 60 capsule 2  ? CALCIUM-MAGNESIUM-VITAMIN D PO Take 1 tablet by mouth daily.    ? cetirizine (ZYRTEC) 10 MG tablet TAKE 1 TABLET(10 MG) BY MOUTH DAILY 90 tablet 0  ? esomeprazole (NEXIUM) 40 MG capsule Take 40 mg by mouth 2 (two) times daily before a meal.     ? FLUoxetine (PROZAC) 40 MG capsule TAKE 1 CAPSULE(40 MG) BY MOUTH DAILY 30 capsule 5  ? fluticasone (FLONASE) 50 MCG/ACT nasal spray Place 1 spray into both nostrils daily. 16 g 8  ? KLONOPIN 1 MG tablet TAKE 1 TABLET(1 MG) BY MOUTH THREE TIMES DAILY AS NEEDED FOR ANXIETY 90 tablet 2  ? KLONOPIN 1 MG tablet TAKE ONE TABLET BY MOUTH THREE TIMES DAILY AS NEEDED FOR ANXIETY 90 tablet 2  ? lisinopril (PRINIVIL,ZESTRIL) 20 MG tablet Take 20 mg by mouth daily.  (Patient not taking: Reported on 01/04/2021)  1  ? methocarbamol (ROBAXIN) 500 MG tablet Take 1-2 tablets (500-1,000 mg total) by mouth every 6 (six) hours as needed for muscle spasms. 60 tablet 0  ? Multiple Vitamin (MULITIVITAMIN WITH MINERALS) TABS  Take 1 tablet by mouth daily.    ? predniSONE (DELTASONE) 20 MG tablet Take 40 mg daily x 5 days 10 tablet 0  ? sodium chloride (OCEAN) 0.65 % SOLN nasal spray Place 2 sprays into both nostrils as needed for congestion. 30 mL 2  ? SYNTHROID 125 MCG tablet Take 125 mcg by mouth at bedtime.     ? vitamin C (ASCORBIC ACID) 500 MG tablet Take 500 mg by mouth 2 (two) times a week.     ? ?No current facility-administered medications for this visit.  ? ? ?Medication Side Effects: None ? ?Allergies:  ?Allergies  ?Allergen Reactions  ? Demerol [Meperidine] Nausea And Vomiting  ? Other   ?  Other reaction(s): hives  ? Penicillins Other  (See Comments)  ?  Unknown childhood allergy ?Has patient had a PCN reaction causing immediate rash, facial/tongue/throat swelling, SOB or lightheadedness with hypotension: Unknown ?Has patient had a PCN reaction causing severe rash involving mucus membranes or skin necrosis: Unknown ?Has patient had a PCN reaction that required hospitalization: Unknown ?Has patient had a PCN reaction occurring within the last 10 years: No ?If all of the above answers are "NO", then may proceed with Cephalosporin use. ?  ? ? ?Past Medical History:  ?Diagnosis Date  ? Anxiety   ? [facility]  ? Arthritis   ? Cataract   ? THESE BEEN REPLACED  ? Depression   ? [facility]  ? Diverticulosis   ? Dry eye syndrome   ? GERD (gastroesophageal reflux disease)   ? History of colon polyps 12/03/2012  ? COLONOSCOPY  ? History of exercise stress test 2010  ? Dr. Ashok Norris   ? Hyperlipidemia   ? Hypertension   ? Hypothyroidism   ? Multinodular goiter   ? DR. KERR  ? OSA (obstructive sleep apnea)   ? (PSG 12/12/15 ESS 2, AHI 42/HR REM 30/HR, O2 MIN 80%)  CPAP- q night , done with [facility]  study done at [facility]  ? Squamous cell carcinoma in situ 2015  ? Thyroid disease   ? Nodules  ? Tubular adenoma   ? DR. Michail Sermon  ? Varicose veins   ? ? ?Past Medical History, Surgical history, Social history, and Family history were reviewed and updated as appropriate.  ? ?Please see review of systems for further details on the patient's review from today.  ? ?Objective:  ? ?Physical Exam:  ?There were no vitals taken for this visit. ? ?Physical Exam ?Constitutional:   ?   General: She is not in acute distress. ?Musculoskeletal:     ?   General: No deformity.  ?Neurological:  ?   Mental Status: She is alert and oriented to person, place, and time.  ?   Coordination: Coordination normal.  ?Psychiatric:     ?   Attention and Perception: Attention and perception normal. She does not perceive auditory or visual hallucinations.     ?   Mood  and Affect: Mood normal. Mood is not anxious or depressed. Affect is not labile, blunt, angry or inappropriate.     ?   Speech: Speech normal.     ?   Behavior: Behavior normal.     ?   Thought Content: Thought content normal. Thought content is not paranoid or delusional. Thought content does not include homicidal or suicidal ideation. Thought content does not include homicidal or suicidal plan.     ?   Cognition and Memory: Cognition and memory normal.     ?  Judgment: Judgment normal.  ?   Comments: Insight intact  ? ? ?Lab Review:  ?   ?Component Value Date/Time  ? NA 130 (L) 12/01/2019 0321  ? K 4.2 12/01/2019 0321  ? CL 98 12/01/2019 0321  ? CO2 23 12/01/2019 0321  ? GLUCOSE 94 12/01/2019 0321  ? BUN 14 12/01/2019 0321  ? CREATININE 0.84 12/01/2019 0321  ? CALCIUM 8.3 (L) 12/01/2019 0321  ? PROT 7.4 11/24/2019 1450  ? ALBUMIN 4.0 11/24/2019 1450  ? AST 17 11/24/2019 1450  ? ALT 18 11/24/2019 1450  ? ALKPHOS 82 11/24/2019 1450  ? BILITOT 0.4 11/24/2019 1450  ? GFRNONAA >60 12/01/2019 0321  ? GFRAA >60 12/01/2019 0321  ? ? ?   ?Component Value Date/Time  ? WBC 7.4 12/01/2019 0321  ? RBC 3.29 (L) 12/01/2019 0321  ? HGB 10.0 (L) 12/01/2019 0321  ? HCT 31.0 (L) 12/01/2019 0321  ? PLT 244 12/01/2019 0321  ? MCV 94.2 12/01/2019 0321  ? MCH 30.4 12/01/2019 0321  ? MCHC 32.3 12/01/2019 0321  ? RDW 15.8 (H) 12/01/2019 0321  ? LYMPHSABS 1.9 11/24/2019 1450  ? MONOABS 0.6 11/24/2019 1450  ? EOSABS 0.2 11/24/2019 1450  ? BASOSABS 0.0 11/24/2019 1450  ? ? ?No results found for: POCLITH, LITHIUM  ? ?No results found for: PHENYTOIN, PHENOBARB, VALPROATE, CBMZ  ? ?.res ?Assessment: Plan:   ? ?Plan: ? ?1. Prozac '40mg'$  daily ?2. Clonazepam '1mg'$  TID ?3. Nuvigil '200mg'$  tablet every morning for sleep apnea ? ?RTC 3 months ? ?Patient advised to contact office with any questions, adverse effects, or acute worsening in signs and symptoms. ? ?Discussed potential benefits, risk, and side effects of benzodiazepines to include potential  risk of tolerance and dependence, as well as possible drowsiness.  Advised patient not to drive if experiencing drowsiness and to take lowest possible effective dose to minimize risk of dependence and toleranc

## 2022-01-11 DIAGNOSIS — Z20822 Contact with and (suspected) exposure to covid-19: Secondary | ICD-10-CM | POA: Diagnosis not present

## 2022-01-17 DIAGNOSIS — M9901 Segmental and somatic dysfunction of cervical region: Secondary | ICD-10-CM | POA: Diagnosis not present

## 2022-01-17 DIAGNOSIS — M9903 Segmental and somatic dysfunction of lumbar region: Secondary | ICD-10-CM | POA: Diagnosis not present

## 2022-01-17 DIAGNOSIS — S338XXA Sprain of other parts of lumbar spine and pelvis, initial encounter: Secondary | ICD-10-CM | POA: Diagnosis not present

## 2022-01-17 DIAGNOSIS — M4802 Spinal stenosis, cervical region: Secondary | ICD-10-CM | POA: Diagnosis not present

## 2022-01-17 DIAGNOSIS — M47812 Spondylosis without myelopathy or radiculopathy, cervical region: Secondary | ICD-10-CM | POA: Diagnosis not present

## 2022-01-17 DIAGNOSIS — M9904 Segmental and somatic dysfunction of sacral region: Secondary | ICD-10-CM | POA: Diagnosis not present

## 2022-01-17 DIAGNOSIS — M9902 Segmental and somatic dysfunction of thoracic region: Secondary | ICD-10-CM | POA: Diagnosis not present

## 2022-01-17 DIAGNOSIS — S29012A Strain of muscle and tendon of back wall of thorax, initial encounter: Secondary | ICD-10-CM | POA: Diagnosis not present

## 2022-01-17 DIAGNOSIS — M5136 Other intervertebral disc degeneration, lumbar region: Secondary | ICD-10-CM | POA: Diagnosis not present

## 2022-01-19 DIAGNOSIS — E039 Hypothyroidism, unspecified: Secondary | ICD-10-CM | POA: Diagnosis not present

## 2022-01-22 ENCOUNTER — Ambulatory Visit: Payer: Medicare Other | Admitting: Podiatry

## 2022-01-22 DIAGNOSIS — Z0000 Encounter for general adult medical examination without abnormal findings: Secondary | ICD-10-CM | POA: Diagnosis not present

## 2022-01-22 DIAGNOSIS — Z0130 Encounter for examination of blood pressure without abnormal findings: Secondary | ICD-10-CM | POA: Diagnosis not present

## 2022-01-22 DIAGNOSIS — R7303 Prediabetes: Secondary | ICD-10-CM | POA: Diagnosis not present

## 2022-01-22 DIAGNOSIS — Z96651 Presence of right artificial knee joint: Secondary | ICD-10-CM | POA: Diagnosis not present

## 2022-01-22 DIAGNOSIS — S129XXA Fracture of neck, unspecified, initial encounter: Secondary | ICD-10-CM | POA: Diagnosis not present

## 2022-01-22 DIAGNOSIS — Z6838 Body mass index (BMI) 38.0-38.9, adult: Secondary | ICD-10-CM | POA: Diagnosis not present

## 2022-01-22 DIAGNOSIS — Z79899 Other long term (current) drug therapy: Secondary | ICD-10-CM | POA: Diagnosis not present

## 2022-01-23 DIAGNOSIS — M9903 Segmental and somatic dysfunction of lumbar region: Secondary | ICD-10-CM | POA: Diagnosis not present

## 2022-01-23 DIAGNOSIS — M9902 Segmental and somatic dysfunction of thoracic region: Secondary | ICD-10-CM | POA: Diagnosis not present

## 2022-01-23 DIAGNOSIS — M9901 Segmental and somatic dysfunction of cervical region: Secondary | ICD-10-CM | POA: Diagnosis not present

## 2022-01-23 DIAGNOSIS — M47812 Spondylosis without myelopathy or radiculopathy, cervical region: Secondary | ICD-10-CM | POA: Diagnosis not present

## 2022-01-23 DIAGNOSIS — M9904 Segmental and somatic dysfunction of sacral region: Secondary | ICD-10-CM | POA: Diagnosis not present

## 2022-01-23 DIAGNOSIS — S29012A Strain of muscle and tendon of back wall of thorax, initial encounter: Secondary | ICD-10-CM | POA: Diagnosis not present

## 2022-01-23 DIAGNOSIS — M4802 Spinal stenosis, cervical region: Secondary | ICD-10-CM | POA: Diagnosis not present

## 2022-01-23 DIAGNOSIS — S338XXA Sprain of other parts of lumbar spine and pelvis, initial encounter: Secondary | ICD-10-CM | POA: Diagnosis not present

## 2022-01-23 DIAGNOSIS — M5136 Other intervertebral disc degeneration, lumbar region: Secondary | ICD-10-CM | POA: Diagnosis not present

## 2022-01-25 DIAGNOSIS — E039 Hypothyroidism, unspecified: Secondary | ICD-10-CM | POA: Diagnosis not present

## 2022-01-25 DIAGNOSIS — E049 Nontoxic goiter, unspecified: Secondary | ICD-10-CM | POA: Diagnosis not present

## 2022-01-25 DIAGNOSIS — I10 Essential (primary) hypertension: Secondary | ICD-10-CM | POA: Diagnosis not present

## 2022-01-25 DIAGNOSIS — R7309 Other abnormal glucose: Secondary | ICD-10-CM | POA: Diagnosis not present

## 2022-01-31 DIAGNOSIS — Z20822 Contact with and (suspected) exposure to covid-19: Secondary | ICD-10-CM | POA: Diagnosis not present

## 2022-02-08 DIAGNOSIS — M9903 Segmental and somatic dysfunction of lumbar region: Secondary | ICD-10-CM | POA: Diagnosis not present

## 2022-02-08 DIAGNOSIS — M47812 Spondylosis without myelopathy or radiculopathy, cervical region: Secondary | ICD-10-CM | POA: Diagnosis not present

## 2022-02-08 DIAGNOSIS — M9904 Segmental and somatic dysfunction of sacral region: Secondary | ICD-10-CM | POA: Diagnosis not present

## 2022-02-08 DIAGNOSIS — M4802 Spinal stenosis, cervical region: Secondary | ICD-10-CM | POA: Diagnosis not present

## 2022-02-08 DIAGNOSIS — S338XXA Sprain of other parts of lumbar spine and pelvis, initial encounter: Secondary | ICD-10-CM | POA: Diagnosis not present

## 2022-02-08 DIAGNOSIS — M5136 Other intervertebral disc degeneration, lumbar region: Secondary | ICD-10-CM | POA: Diagnosis not present

## 2022-02-08 DIAGNOSIS — M9902 Segmental and somatic dysfunction of thoracic region: Secondary | ICD-10-CM | POA: Diagnosis not present

## 2022-02-08 DIAGNOSIS — M9901 Segmental and somatic dysfunction of cervical region: Secondary | ICD-10-CM | POA: Diagnosis not present

## 2022-02-08 DIAGNOSIS — S29012A Strain of muscle and tendon of back wall of thorax, initial encounter: Secondary | ICD-10-CM | POA: Diagnosis not present

## 2022-02-09 DIAGNOSIS — H04123 Dry eye syndrome of bilateral lacrimal glands: Secondary | ICD-10-CM | POA: Diagnosis not present

## 2022-02-14 ENCOUNTER — Other Ambulatory Visit: Payer: Self-pay | Admitting: Family Medicine

## 2022-02-14 DIAGNOSIS — Z1231 Encounter for screening mammogram for malignant neoplasm of breast: Secondary | ICD-10-CM

## 2022-02-19 ENCOUNTER — Other Ambulatory Visit: Payer: Self-pay | Admitting: Family Medicine

## 2022-02-19 DIAGNOSIS — E2839 Other primary ovarian failure: Secondary | ICD-10-CM

## 2022-02-21 ENCOUNTER — Ambulatory Visit (INDEPENDENT_AMBULATORY_CARE_PROVIDER_SITE_OTHER): Payer: Medicare Other | Admitting: Podiatry

## 2022-02-21 DIAGNOSIS — M7751 Other enthesopathy of right foot: Secondary | ICD-10-CM | POA: Diagnosis not present

## 2022-02-21 DIAGNOSIS — R52 Pain, unspecified: Secondary | ICD-10-CM

## 2022-02-21 DIAGNOSIS — M7752 Other enthesopathy of left foot: Secondary | ICD-10-CM

## 2022-02-21 MED ORDER — BETAMETHASONE SOD PHOS & ACET 6 (3-3) MG/ML IJ SUSP
3.0000 mg | Freq: Once | INTRAMUSCULAR | Status: AC
  Administered 2022-02-21: 3 mg via INTRA_ARTICULAR

## 2022-02-21 NOTE — Progress Notes (Signed)
HPI: 71 y.o. female presenting today for new complaint of pain regarding her bilateral feet.  Patient states that she has been treated and seen by pain management.  Currently there is change of management at her pain management facility and she is not satisfied.  She she has a chronic history of pain and tenderness to the bilateral great toe joints and she was diagnosed previously with metatarsalgia.  She says that her old physician used to administer steroid injections occasionally into the first MTP joint of the bilateral feet which provided significant lasting relief.  She says that the new physician that she sees does not administer injections.  She is requesting injection today.  Past Medical History:  Diagnosis Date   Anxiety    [facility]   Arthritis    Cataract    THESE BEEN REPLACED   Depression    [facility]   Diverticulosis    Dry eye syndrome    GERD (gastroesophageal reflux disease)    History of colon polyps 12/03/2012   COLONOSCOPY   History of exercise stress test 2010   Dr. Ashok Norris    Hyperlipidemia    Hypertension    Hypothyroidism    Multinodular goiter    DR. KERR   OSA (obstructive sleep apnea)    (PSG 12/12/15 ESS 2, AHI 42/HR REM 30/HR, O2 MIN 80%)  CPAP- q night , done with [facility]  study done at [facility]   Squamous cell carcinoma in situ 2015   Thyroid disease    Nodules   Tubular adenoma    DR. SCHOOLER   Varicose veins     Past Surgical History:  Procedure Laterality Date   BREAST EXCISIONAL BIOPSY Left    benign   CHOLECYSTECTOMY  2003   Gall Bladder   COLONOSCOPY  06/25/2017   [facility]   EYE SURGERY Right    Cataract   EYE SURGERY Left    Cataract   fibroid adenoma  1989 and 19991   SEPTOPLASTY     for deviated septum   SYNOVECTOMY WITH POLY EXCHANGE Right 11/30/2019   Procedure: SYNOVECTOMY WITH POLY EXCHANGE;  Surgeon: Vickey Huger, MD;  Location: [facility];  Service: Orthopedics;  Laterality: Right;    TOTAL KNEE ARTHROPLASTY Right 11/22/2017   Procedure: RIGHT TOTAL KNEE ARTHROPLASTY;  Surgeon: Earlie Server, MD;  Location: [facility];  Service: Orthopedics;  Laterality: Right;    Allergies  Allergen Reactions   Demerol [Meperidine] Nausea And Vomiting   Other     Other reaction(s): hives   Penicillins Other (See Comments)    Unknown childhood allergy Has patient had a PCN reaction causing immediate rash, facial/tongue/throat swelling, SOB or lightheadedness with hypotension: Unknown Has patient had a PCN reaction causing severe rash involving mucus membranes or skin necrosis: Unknown Has patient had a PCN reaction that required hospitalization: Unknown Has patient had a PCN reaction occurring within the last 10 years: No If all of the above answers are "NO", then may proceed with Cephalosporin use.      Physical Exam: General: The patient is alert and oriented x3 in no acute distress.  Dermatology: Skin is warm, dry and supple bilateral lower extremities. Negative for open lesions or macerations.  Vascular: Palpable pedal pulses bilaterally. Capillary refill within normal limits.  Negative for any significant edema or erythema  Neurological: Light touch and protective threshold grossly intact  Musculoskeletal Exam: No pedal deformities noted.  There is pain on palpation of the first  MTP joint bilateral and some diffuse pain radiating to the lesser MTP joints as well bilateral   Assessment: 1.  Chronic metatarsalgia/first MTP capsulitis bilateral   Plan of Care:  1. Patient evaluated. 2.  Injection of 0.5 cc Celestone Soluspan injected the first MTP bilateral 3.  Continue wearing good supportive shoes and arch supports to offload pressure from the forefoot and support the medial longitudinal arch 4.  Continue management with pain management 5.  Return to clinic as needed    Edrick Kins, DPM [facility]  Dr. Edrick Kins, DPM    [address], [place] [address]                Office [phone]  Fax [phone]

## 2022-02-27 ENCOUNTER — Ambulatory Visit
Admission: RE | Admit: 2022-02-27 | Discharge: 2022-02-27 | Disposition: A | Discharge status: Home / Self Care | Payer: Medicare Other | Source: Ambulatory Visit | Attending: Family Medicine | Admitting: Family Medicine

## 2022-02-27 DIAGNOSIS — Z1231 Encounter for screening mammogram for malignant neoplasm of breast: Secondary | ICD-10-CM

## 2022-02-28 DIAGNOSIS — Z6841 Body Mass Index (BMI) 40.0 and over, adult: Secondary | ICD-10-CM | POA: Diagnosis not present

## 2022-02-28 DIAGNOSIS — M7742 Metatarsalgia, left foot: Secondary | ICD-10-CM | POA: Diagnosis not present

## 2022-02-28 DIAGNOSIS — M7741 Metatarsalgia, right foot: Secondary | ICD-10-CM | POA: Diagnosis not present

## 2022-02-28 DIAGNOSIS — R030 Elevated blood-pressure reading, without diagnosis of hypertension: Secondary | ICD-10-CM | POA: Diagnosis not present

## 2022-02-28 DIAGNOSIS — R7303 Prediabetes: Secondary | ICD-10-CM | POA: Diagnosis not present

## 2022-02-28 DIAGNOSIS — Z79899 Other long term (current) drug therapy: Secondary | ICD-10-CM | POA: Diagnosis not present

## 2022-03-01 ENCOUNTER — Other Ambulatory Visit: Payer: Self-pay | Admitting: Family Medicine

## 2022-03-01 ENCOUNTER — Telehealth: Payer: Self-pay

## 2022-03-01 DIAGNOSIS — M9904 Segmental and somatic dysfunction of sacral region: Secondary | ICD-10-CM | POA: Diagnosis not present

## 2022-03-01 DIAGNOSIS — R928 Other abnormal and inconclusive findings on diagnostic imaging of breast: Secondary | ICD-10-CM

## 2022-03-01 DIAGNOSIS — M9901 Segmental and somatic dysfunction of cervical region: Secondary | ICD-10-CM | POA: Diagnosis not present

## 2022-03-01 DIAGNOSIS — M9902 Segmental and somatic dysfunction of thoracic region: Secondary | ICD-10-CM | POA: Diagnosis not present

## 2022-03-01 DIAGNOSIS — M5136 Other intervertebral disc degeneration, lumbar region: Secondary | ICD-10-CM | POA: Diagnosis not present

## 2022-03-01 DIAGNOSIS — S338XXA Sprain of other parts of lumbar spine and pelvis, initial encounter: Secondary | ICD-10-CM | POA: Diagnosis not present

## 2022-03-01 DIAGNOSIS — S29012A Strain of muscle and tendon of back wall of thorax, initial encounter: Secondary | ICD-10-CM | POA: Diagnosis not present

## 2022-03-01 DIAGNOSIS — M4802 Spinal stenosis, cervical region: Secondary | ICD-10-CM | POA: Diagnosis not present

## 2022-03-01 DIAGNOSIS — M47812 Spondylosis without myelopathy or radiculopathy, cervical region: Secondary | ICD-10-CM | POA: Diagnosis not present

## 2022-03-01 DIAGNOSIS — M9903 Segmental and somatic dysfunction of lumbar region: Secondary | ICD-10-CM | POA: Diagnosis not present

## 2022-03-01 NOTE — Telephone Encounter (Signed)
Prior Authorization renewal submitted and approved for ARMODAFINIL 250 MG effective 10/01/2021-03/01/2023 with [employer]

## 2022-03-02 ENCOUNTER — Other Ambulatory Visit: Payer: Self-pay | Admitting: Pulmonary Disease

## 2022-03-02 MED ORDER — CETIRIZINE HCL 10 MG PO TABS: ORAL_TABLET | ORAL | 0 refills | Status: AC

## 2022-03-06 ENCOUNTER — Ambulatory Visit
Admission: RE | Admit: 2022-03-06 | Discharge: 2022-03-06 | Disposition: A | Discharge status: Home / Self Care | Payer: Medicare Other | Source: Ambulatory Visit | Attending: Family Medicine | Admitting: Family Medicine

## 2022-03-06 DIAGNOSIS — R928 Other abnormal and inconclusive findings on diagnostic imaging of breast: Secondary | ICD-10-CM | POA: Diagnosis not present

## 2022-03-06 DIAGNOSIS — N6489 Other specified disorders of breast: Secondary | ICD-10-CM | POA: Diagnosis not present

## 2022-03-19 DIAGNOSIS — K219 Gastro-esophageal reflux disease without esophagitis: Secondary | ICD-10-CM | POA: Diagnosis not present

## 2022-03-19 DIAGNOSIS — E538 Deficiency of other specified B group vitamins: Secondary | ICD-10-CM | POA: Diagnosis not present

## 2022-03-19 DIAGNOSIS — Z0000 Encounter for general adult medical examination without abnormal findings: Secondary | ICD-10-CM | POA: Diagnosis not present

## 2022-03-19 DIAGNOSIS — Z1331 Encounter for screening for depression: Secondary | ICD-10-CM | POA: Diagnosis not present

## 2022-03-19 DIAGNOSIS — F419 Anxiety disorder, unspecified: Secondary | ICD-10-CM | POA: Diagnosis not present

## 2022-03-19 DIAGNOSIS — I10 Essential (primary) hypertension: Secondary | ICD-10-CM | POA: Diagnosis not present

## 2022-03-19 DIAGNOSIS — E7800 Pure hypercholesterolemia, unspecified: Secondary | ICD-10-CM | POA: Diagnosis not present

## 2022-03-19 DIAGNOSIS — G4700 Insomnia, unspecified: Secondary | ICD-10-CM | POA: Diagnosis not present

## 2022-03-19 DIAGNOSIS — I679 Cerebrovascular disease, unspecified: Secondary | ICD-10-CM | POA: Diagnosis not present

## 2022-03-20 DIAGNOSIS — S338XXA Sprain of other parts of lumbar spine and pelvis, initial encounter: Secondary | ICD-10-CM | POA: Diagnosis not present

## 2022-03-20 DIAGNOSIS — S29012A Strain of muscle and tendon of back wall of thorax, initial encounter: Secondary | ICD-10-CM | POA: Diagnosis not present

## 2022-03-20 DIAGNOSIS — M9903 Segmental and somatic dysfunction of lumbar region: Secondary | ICD-10-CM | POA: Diagnosis not present

## 2022-03-20 DIAGNOSIS — M9904 Segmental and somatic dysfunction of sacral region: Secondary | ICD-10-CM | POA: Diagnosis not present

## 2022-03-20 DIAGNOSIS — M9901 Segmental and somatic dysfunction of cervical region: Secondary | ICD-10-CM | POA: Diagnosis not present

## 2022-03-20 DIAGNOSIS — M9902 Segmental and somatic dysfunction of thoracic region: Secondary | ICD-10-CM | POA: Diagnosis not present

## 2022-03-20 DIAGNOSIS — M5136 Other intervertebral disc degeneration, lumbar region: Secondary | ICD-10-CM | POA: Diagnosis not present

## 2022-03-20 DIAGNOSIS — M47812 Spondylosis without myelopathy or radiculopathy, cervical region: Secondary | ICD-10-CM | POA: Diagnosis not present

## 2022-03-20 DIAGNOSIS — M4802 Spinal stenosis, cervical region: Secondary | ICD-10-CM | POA: Diagnosis not present

## 2022-03-28 ENCOUNTER — Other Ambulatory Visit: Payer: Self-pay | Admitting: Family Medicine

## 2022-03-28 DIAGNOSIS — N6489 Other specified disorders of breast: Secondary | ICD-10-CM

## 2022-03-30 DIAGNOSIS — Z79899 Other long term (current) drug therapy: Secondary | ICD-10-CM | POA: Diagnosis not present

## 2022-03-30 DIAGNOSIS — R030 Elevated blood-pressure reading, without diagnosis of hypertension: Secondary | ICD-10-CM | POA: Diagnosis not present

## 2022-03-30 DIAGNOSIS — M7741 Metatarsalgia, right foot: Secondary | ICD-10-CM | POA: Diagnosis not present

## 2022-03-30 DIAGNOSIS — R319 Hematuria, unspecified: Secondary | ICD-10-CM | POA: Diagnosis not present

## 2022-03-30 DIAGNOSIS — M549 Dorsalgia, unspecified: Secondary | ICD-10-CM | POA: Diagnosis not present

## 2022-03-30 DIAGNOSIS — G8929 Other chronic pain: Secondary | ICD-10-CM | POA: Diagnosis not present

## 2022-03-30 DIAGNOSIS — Z6841 Body Mass Index (BMI) 40.0 and over, adult: Secondary | ICD-10-CM | POA: Diagnosis not present

## 2022-04-02 DIAGNOSIS — L720 Epidermal cyst: Secondary | ICD-10-CM | POA: Diagnosis not present

## 2022-04-02 DIAGNOSIS — L821 Other seborrheic keratosis: Secondary | ICD-10-CM | POA: Diagnosis not present

## 2022-04-04 DIAGNOSIS — M9901 Segmental and somatic dysfunction of cervical region: Secondary | ICD-10-CM | POA: Diagnosis not present

## 2022-04-04 DIAGNOSIS — M9903 Segmental and somatic dysfunction of lumbar region: Secondary | ICD-10-CM | POA: Diagnosis not present

## 2022-04-04 DIAGNOSIS — M4802 Spinal stenosis, cervical region: Secondary | ICD-10-CM | POA: Diagnosis not present

## 2022-04-04 DIAGNOSIS — S338XXA Sprain of other parts of lumbar spine and pelvis, initial encounter: Secondary | ICD-10-CM | POA: Diagnosis not present

## 2022-04-04 DIAGNOSIS — M47812 Spondylosis without myelopathy or radiculopathy, cervical region: Secondary | ICD-10-CM | POA: Diagnosis not present

## 2022-04-04 DIAGNOSIS — M9902 Segmental and somatic dysfunction of thoracic region: Secondary | ICD-10-CM | POA: Diagnosis not present

## 2022-04-04 DIAGNOSIS — M9904 Segmental and somatic dysfunction of sacral region: Secondary | ICD-10-CM | POA: Diagnosis not present

## 2022-04-04 DIAGNOSIS — M5136 Other intervertebral disc degeneration, lumbar region: Secondary | ICD-10-CM | POA: Diagnosis not present

## 2022-04-04 DIAGNOSIS — S29012A Strain of muscle and tendon of back wall of thorax, initial encounter: Secondary | ICD-10-CM | POA: Diagnosis not present

## 2022-04-12 ENCOUNTER — Telehealth: Payer: Self-pay | Admitting: Adult Health

## 2022-04-12 NOTE — Telephone Encounter (Signed)
Pt called and asked if she could go back up to armodafinil 250 mg. She said she is still tired and wants to go back to that dose. Please call her and let her know and send in a script . [phone]

## 2022-04-12 NOTE — Telephone Encounter (Signed)
LVM to [facility] 

## 2022-04-16 NOTE — Telephone Encounter (Signed)
Left another VM for patient to [facility].

## 2022-04-17 ENCOUNTER — Ambulatory Visit: Payer: Medicare Other | Admitting: Adult Health

## 2022-04-17 NOTE — Telephone Encounter (Signed)
Tried [employer], son, and his mailbox is full.

## 2022-04-17 NOTE — Telephone Encounter (Signed)
Left a third VM. Goes straight to VM.

## 2022-04-17 NOTE — Telephone Encounter (Signed)
Patient returned my call and I was not available. Called her back and got her VM.

## 2022-04-18 NOTE — Telephone Encounter (Signed)
Ok to pend.

## 2022-04-18 NOTE — Telephone Encounter (Signed)
Patient is asking to go back to 250 mg Armodafinil. She said she is exhausted. When she was first started on it in June 2022 she was started on this dose. She last filled 200 mg dose on 6/30. Will pend increased dose if you are agreeable.

## 2022-04-18 NOTE — Telephone Encounter (Signed)
Last filled 6/30, due 7/28.

## 2022-04-22 ENCOUNTER — Other Ambulatory Visit: Payer: Self-pay | Admitting: Adult Health

## 2022-04-22 DIAGNOSIS — G4730 Sleep apnea, unspecified: Secondary | ICD-10-CM

## 2022-04-23 NOTE — Telephone Encounter (Signed)
Patient is going back to 250 mg dosing. She is due for a RF on 7/28. Have marked for F/U.

## 2022-04-24 DIAGNOSIS — M9902 Segmental and somatic dysfunction of thoracic region: Secondary | ICD-10-CM | POA: Diagnosis not present

## 2022-04-24 DIAGNOSIS — M9901 Segmental and somatic dysfunction of cervical region: Secondary | ICD-10-CM | POA: Diagnosis not present

## 2022-04-24 DIAGNOSIS — M9903 Segmental and somatic dysfunction of lumbar region: Secondary | ICD-10-CM | POA: Diagnosis not present

## 2022-04-24 DIAGNOSIS — M9904 Segmental and somatic dysfunction of sacral region: Secondary | ICD-10-CM | POA: Diagnosis not present

## 2022-04-24 DIAGNOSIS — S338XXA Sprain of other parts of lumbar spine and pelvis, initial encounter: Secondary | ICD-10-CM | POA: Diagnosis not present

## 2022-04-24 DIAGNOSIS — M47812 Spondylosis without myelopathy or radiculopathy, cervical region: Secondary | ICD-10-CM | POA: Diagnosis not present

## 2022-04-24 DIAGNOSIS — S29012A Strain of muscle and tendon of back wall of thorax, initial encounter: Secondary | ICD-10-CM | POA: Diagnosis not present

## 2022-04-24 DIAGNOSIS — M5136 Other intervertebral disc degeneration, lumbar region: Secondary | ICD-10-CM | POA: Diagnosis not present

## 2022-04-24 DIAGNOSIS — M4802 Spinal stenosis, cervical region: Secondary | ICD-10-CM | POA: Diagnosis not present

## 2022-04-26 ENCOUNTER — Telehealth: Payer: Self-pay | Admitting: Adult Health

## 2022-04-26 DIAGNOSIS — Z79899 Other long term (current) drug therapy: Secondary | ICD-10-CM | POA: Diagnosis not present

## 2022-04-26 DIAGNOSIS — R7303 Prediabetes: Secondary | ICD-10-CM | POA: Diagnosis not present

## 2022-04-26 NOTE — Telephone Encounter (Signed)
Olivia [name] called this morning at 9:10 to inquire about the refill for her Armodafinil.  She out of the medication. I told her it is not due for refill until tomorrow.  She said she can't explain why she is out early, but she doesn't any for today and is anxious about getting the refill.  She is asking for it to be sent in today.  Appt 8/4.  [facility] on [address].

## 2022-04-26 NOTE — Telephone Encounter (Signed)
Rx was sent earlier this morning. VM left for patient.

## 2022-05-01 DIAGNOSIS — S29012A Strain of muscle and tendon of back wall of thorax, initial encounter: Secondary | ICD-10-CM | POA: Diagnosis not present

## 2022-05-01 DIAGNOSIS — M4802 Spinal stenosis, cervical region: Secondary | ICD-10-CM | POA: Diagnosis not present

## 2022-05-01 DIAGNOSIS — M5136 Other intervertebral disc degeneration, lumbar region: Secondary | ICD-10-CM | POA: Diagnosis not present

## 2022-05-01 DIAGNOSIS — M9901 Segmental and somatic dysfunction of cervical region: Secondary | ICD-10-CM | POA: Diagnosis not present

## 2022-05-01 DIAGNOSIS — M9903 Segmental and somatic dysfunction of lumbar region: Secondary | ICD-10-CM | POA: Diagnosis not present

## 2022-05-01 DIAGNOSIS — S338XXA Sprain of other parts of lumbar spine and pelvis, initial encounter: Secondary | ICD-10-CM | POA: Diagnosis not present

## 2022-05-01 DIAGNOSIS — M9904 Segmental and somatic dysfunction of sacral region: Secondary | ICD-10-CM | POA: Diagnosis not present

## 2022-05-01 DIAGNOSIS — M9902 Segmental and somatic dysfunction of thoracic region: Secondary | ICD-10-CM | POA: Diagnosis not present

## 2022-05-01 DIAGNOSIS — M47812 Spondylosis without myelopathy or radiculopathy, cervical region: Secondary | ICD-10-CM | POA: Diagnosis not present

## 2022-05-04 ENCOUNTER — Encounter: Payer: Self-pay | Admitting: Adult Health

## 2022-05-04 ENCOUNTER — Ambulatory Visit (INDEPENDENT_AMBULATORY_CARE_PROVIDER_SITE_OTHER): Payer: Medicare Other | Admitting: Adult Health

## 2022-05-04 DIAGNOSIS — G4700 Insomnia, unspecified: Secondary | ICD-10-CM

## 2022-05-04 DIAGNOSIS — F331 Major depressive disorder, recurrent, moderate: Secondary | ICD-10-CM | POA: Diagnosis not present

## 2022-05-04 DIAGNOSIS — G4730 Sleep apnea, unspecified: Secondary | ICD-10-CM | POA: Diagnosis not present

## 2022-05-04 DIAGNOSIS — F411 Generalized anxiety disorder: Secondary | ICD-10-CM | POA: Diagnosis not present

## 2022-05-04 MED ORDER — FLUOXETINE HCL 20 MG PO CAPS: 20.0000 mg | ORAL_CAPSULE | Freq: Every day | ORAL | 5 refills | Status: DC

## 2022-05-04 MED ORDER — ARMODAFINIL 250 MG PO TABS: ORAL_TABLET | ORAL | 2 refills | Status: DC

## 2022-05-04 MED ORDER — KLONOPIN 1 MG PO TABS: ORAL_TABLET | ORAL | 2 refills | Status: DC

## 2022-05-04 MED ORDER — FLUOXETINE HCL 40 MG PO CAPS: ORAL_CAPSULE | ORAL | 5 refills | Status: DC

## 2022-05-04 NOTE — Progress Notes (Signed)
Olivia [name] [id number] 01-Jul-1951 71 y.o.  Subjective:   Patient ID:  Olivia [name] is a 71 y.o. (DOB 05/01/1951) female.  Chief Complaint: No chief complaint on file.   HPI Olivia [name] presents to the office today for follow-up of depression, anxiety, sleep apnea, and insomnia.  Describes mood today as "not the best". Pleasant. Denies tearfulness. Mood symptoms - reports anxiety, depression and irritability. Increased worry and rumination. Mood is lower - "feeling kinda sad". Increased situational stressors. Concerned about son. Working with pain management clinic. Stable interest and motivation. Taking medications as prescribed. Energy levels lower - feels tired. Active, does not have a regular exercise routine. Enjoys some usual interests and activities. Single. Lives with son. Talking to with friends.  Appetite adequate. Weight stable. Sleeps better some nights than others. Averages 9 hours. Using CPAP - has new machine. Focus and concentration difficulties at times. Completing tasks. Managing some aspects of household. Retired. Denies SI or HI.  Denies AH or VH.     Review of Systems:  Review of Systems  Musculoskeletal:  Negative for gait problem.  Neurological:  Negative for tremors.  Psychiatric/Behavioral:         Please refer to HPI    Medications: I have reviewed the patient's current medications.  Current Outpatient Medications  Medication Sig Dispense Refill   acetaminophen (TYLENOL) 500 MG tablet Take 2 tablets (1,000 mg total) by mouth every 6 (six) hours as needed for mild pain or moderate pain. 30 tablet 0   amLODipine (NORVASC) 5 MG tablet TAKE 1 TABLET(5 MG) BY MOUTH DAILY 30 tablet 5   Armodafinil 200 MG TABS Take 200 mg by mouth daily for 1 dose. 30 tablet 2   Armodafinil 250 MG tablet TAKE 1 TABLET(250 MG) BY MOUTH DAILY 30 tablet 1   aspirin EC 325 MG EC tablet Take 1 tablet (325 mg total) by mouth 2 (two) times daily. (Patient not taking: Reported on  01/04/2021) 30 tablet 0   atenolol (TENORMIN) 25 MG tablet Take 25 mg by mouth at bedtime.      azelastine (ASTELIN) 0.1 % nasal spray Place 2 sprays into both nostrils daily as needed for rhinitis. Use in each nostril as directed 30 mL 3   azithromycin (ZITHROMAX) 250 MG tablet Take 2 tablets today then 1 tablet daily until gone 6 tablet 0   benzonatate (TESSALON) 100 MG capsule TAKE 1 CAPSULE(100 MG) BY MOUTH TWICE DAILY AS NEEDED FOR COUGH 60 capsule 2   CALCIUM-MAGNESIUM-VITAMIN D PO Take 1 tablet by mouth daily.     cetirizine (ZYRTEC) 10 MG tablet TAKE 1 TABLET(10 MG) BY MOUTH DAILY 90 tablet 0   esomeprazole (NEXIUM) 40 MG capsule Take 40 mg by mouth 2 (two) times daily before a meal.      FLUoxetine (PROZAC) 40 MG capsule TAKE 1 CAPSULE(40 MG) BY MOUTH DAILY 30 capsule 5   fluticasone (FLONASE) 50 MCG/ACT nasal spray Place 1 spray into both nostrils daily. 16 g 8   KLONOPIN 1 MG tablet TAKE 1 TABLET(1 MG) BY MOUTH THREE TIMES DAILY AS NEEDED FOR ANXIETY 90 tablet 2   KLONOPIN 1 MG tablet TAKE ONE TABLET BY MOUTH THREE TIMES DAILY AS NEEDED FOR ANXIETY 90 tablet 2   lisinopril (PRINIVIL,ZESTRIL) 20 MG tablet Take 20 mg by mouth daily.  (Patient not taking: Reported on 01/04/2021)  1   methocarbamol (ROBAXIN) 500 MG tablet Take 1-2 tablets (500-1,000 mg total) by mouth every 6 (six) hours as  needed for muscle spasms. 60 tablet 0   Multiple Vitamin (MULITIVITAMIN WITH MINERALS) TABS Take 1 tablet by mouth daily.     predniSONE (DELTASONE) 20 MG tablet Take 40 mg daily x 5 days 10 tablet 0   sodium chloride (OCEAN) 0.65 % SOLN nasal spray Place 2 sprays into both nostrils as needed for congestion. 30 mL 2   SYNTHROID 125 MCG tablet Take 125 mcg by mouth at bedtime.      vitamin C (ASCORBIC ACID) 500 MG tablet Take 500 mg by mouth 2 (two) times a week.      No current facility-administered medications for this visit.    Medication Side Effects: None  Allergies:  Allergies  Allergen  Reactions   Demerol [Meperidine] Nausea And Vomiting   Other     Other reaction(s): hives   Penicillins Other (See Comments)    Unknown childhood allergy Has patient had a PCN reaction causing immediate rash, facial/tongue/throat swelling, SOB or lightheadedness with hypotension: Unknown Has patient had a PCN reaction causing severe rash involving mucus membranes or skin necrosis: Unknown Has patient had a PCN reaction that required hospitalization: Unknown Has patient had a PCN reaction occurring within the last 10 years: No If all of the above answers are "NO", then may proceed with Cephalosporin use.     Past Medical History:  Diagnosis Date   Anxiety    [facility]   Arthritis    Cataract    THESE BEEN REPLACED   Depression    [facility]   Diverticulosis    Dry eye syndrome    GERD (gastroesophageal reflux disease)    History of colon polyps 12/03/2012   COLONOSCOPY   History of exercise stress test 2010   Dr. Ashok Norris    Hyperlipidemia    Hypertension    Hypothyroidism    Multinodular goiter    DR. KERR   OSA (obstructive sleep apnea)    (PSG 12/12/15 ESS 2, AHI 42/HR REM 30/HR, O2 MIN 80%)  CPAP- q night , done with [facility].  study done at [facility]   Squamous cell carcinoma in situ 2015   Thyroid disease    Nodules   Tubular adenoma    DR. [place]   Varicose veins     Past Medical History, Surgical history, Social history, and Family history were reviewed and updated as appropriate.   Please see review of systems for further details on the patient's review from today.   Objective:   Physical Exam:  There were no vitals taken for this visit.  Physical Exam Constitutional:      General: She is not in acute distress. Musculoskeletal:        General: No deformity.  Neurological:     Mental Status: She is alert and oriented to person, place, and time.     Coordination: Coordination normal.  Psychiatric:        Attention and  Perception: Attention and perception normal. She does not perceive auditory or visual hallucinations.        Mood and Affect: Mood normal. Mood is not anxious or depressed. Affect is not labile, blunt, angry or inappropriate.        Speech: Speech normal.        Behavior: Behavior normal.        Thought Content: Thought content normal. Thought content is not paranoid or delusional. Thought content does not include homicidal or suicidal ideation. Thought content does not include homicidal or suicidal  plan.        Cognition and Memory: Cognition and memory normal.        Judgment: Judgment normal.     Comments: Insight intact     Lab Review:     Component Value Date/Time   NA 130 (L) 12/01/2019 0321   K 4.2 12/01/2019 0321   CL 98 12/01/2019 0321   CO2 23 12/01/2019 0321   GLUCOSE 94 12/01/2019 0321   BUN 14 12/01/2019 0321   CREATININE 0.84 12/01/2019 0321   CALCIUM 8.3 (L) 12/01/2019 0321   PROT 7.4 11/24/2019 1450   ALBUMIN 4.0 11/24/2019 1450   AST 17 11/24/2019 1450   ALT 18 11/24/2019 1450   ALKPHOS 82 11/24/2019 1450   BILITOT 0.4 11/24/2019 1450   GFRNONAA >60 12/01/2019 0321   GFRAA >60 12/01/2019 0321       Component Value Date/Time   WBC 7.4 12/01/2019 0321   RBC 3.29 (L) 12/01/2019 0321   HGB 10.0 (L) 12/01/2019 0321   HCT 31.0 (L) 12/01/2019 0321   PLT 244 12/01/2019 0321   MCV 94.2 12/01/2019 0321   MCH 30.4 12/01/2019 0321   MCHC 32.3 12/01/2019 0321   RDW 15.8 (H) 12/01/2019 0321   LYMPHSABS 1.9 11/24/2019 1450   MONOABS 0.6 11/24/2019 1450   EOSABS 0.2 11/24/2019 1450   BASOSABS 0.0 11/24/2019 1450    No results found for: "POCLITH", "LITHIUM"   No results found for: "PHENYTOIN", "PHENOBARB", "VALPROATE", "CBMZ"   .res Assessment: Plan:    Plan:  1. Prozac '40mg'$  to '60mg'$  daily 2. Clonazepam '1mg'$  TID 3. Nuvigil '250mg'$  tablet every morning for sleep apnea  RTC 3 months  Patient advised to contact office with any questions, adverse effects, or  acute worsening in signs and symptoms.  Discussed potential benefits, risk, and side effects of benzodiazepines to include potential risk of tolerance and dependence, as well as possible drowsiness.  Advised patient not to drive if experiencing drowsiness and to take lowest possible effective dose to minimize risk of dependence and tolerance.  There are no diagnoses linked to this encounter.   Please see After Visit Summary for patient specific instructions.  Future Appointments  Date Time Provider [place]  06/07/2022  1:40 PM [facility] DIAG [place] 1 [facility]  06/07/2022  1:50 PM [facility] [name] 1 [facility]  09/10/2022  3:00 PM [facility] DX DEXA 1 [facility]    No orders of the defined types were placed in this encounter.   -------------------------------

## 2022-05-08 DIAGNOSIS — M9904 Segmental and somatic dysfunction of sacral region: Secondary | ICD-10-CM | POA: Diagnosis not present

## 2022-05-08 DIAGNOSIS — M9903 Segmental and somatic dysfunction of lumbar region: Secondary | ICD-10-CM | POA: Diagnosis not present

## 2022-05-08 DIAGNOSIS — M9901 Segmental and somatic dysfunction of cervical region: Secondary | ICD-10-CM | POA: Diagnosis not present

## 2022-05-08 DIAGNOSIS — S338XXA Sprain of other parts of lumbar spine and pelvis, initial encounter: Secondary | ICD-10-CM | POA: Diagnosis not present

## 2022-05-08 DIAGNOSIS — M9902 Segmental and somatic dysfunction of thoracic region: Secondary | ICD-10-CM | POA: Diagnosis not present

## 2022-05-08 DIAGNOSIS — M5136 Other intervertebral disc degeneration, lumbar region: Secondary | ICD-10-CM | POA: Diagnosis not present

## 2022-05-08 DIAGNOSIS — M47812 Spondylosis without myelopathy or radiculopathy, cervical region: Secondary | ICD-10-CM | POA: Diagnosis not present

## 2022-05-08 DIAGNOSIS — M4802 Spinal stenosis, cervical region: Secondary | ICD-10-CM | POA: Diagnosis not present

## 2022-05-08 DIAGNOSIS — S29012A Strain of muscle and tendon of back wall of thorax, initial encounter: Secondary | ICD-10-CM | POA: Diagnosis not present

## 2022-05-10 DIAGNOSIS — M9904 Segmental and somatic dysfunction of sacral region: Secondary | ICD-10-CM | POA: Diagnosis not present

## 2022-05-10 DIAGNOSIS — M5136 Other intervertebral disc degeneration, lumbar region: Secondary | ICD-10-CM | POA: Diagnosis not present

## 2022-05-10 DIAGNOSIS — M9902 Segmental and somatic dysfunction of thoracic region: Secondary | ICD-10-CM | POA: Diagnosis not present

## 2022-05-10 DIAGNOSIS — M4802 Spinal stenosis, cervical region: Secondary | ICD-10-CM | POA: Diagnosis not present

## 2022-05-10 DIAGNOSIS — S29012A Strain of muscle and tendon of back wall of thorax, initial encounter: Secondary | ICD-10-CM | POA: Diagnosis not present

## 2022-05-10 DIAGNOSIS — M9903 Segmental and somatic dysfunction of lumbar region: Secondary | ICD-10-CM | POA: Diagnosis not present

## 2022-05-10 DIAGNOSIS — S338XXA Sprain of other parts of lumbar spine and pelvis, initial encounter: Secondary | ICD-10-CM | POA: Diagnosis not present

## 2022-05-10 DIAGNOSIS — M47812 Spondylosis without myelopathy or radiculopathy, cervical region: Secondary | ICD-10-CM | POA: Diagnosis not present

## 2022-05-10 DIAGNOSIS — M9901 Segmental and somatic dysfunction of cervical region: Secondary | ICD-10-CM | POA: Diagnosis not present

## 2022-05-21 DIAGNOSIS — M5136 Other intervertebral disc degeneration, lumbar region: Secondary | ICD-10-CM | POA: Diagnosis not present

## 2022-05-21 DIAGNOSIS — M9901 Segmental and somatic dysfunction of cervical region: Secondary | ICD-10-CM | POA: Diagnosis not present

## 2022-05-21 DIAGNOSIS — S29012A Strain of muscle and tendon of back wall of thorax, initial encounter: Secondary | ICD-10-CM | POA: Diagnosis not present

## 2022-05-21 DIAGNOSIS — M9902 Segmental and somatic dysfunction of thoracic region: Secondary | ICD-10-CM | POA: Diagnosis not present

## 2022-05-21 DIAGNOSIS — M9905 Segmental and somatic dysfunction of pelvic region: Secondary | ICD-10-CM | POA: Diagnosis not present

## 2022-05-21 DIAGNOSIS — M4802 Spinal stenosis, cervical region: Secondary | ICD-10-CM | POA: Diagnosis not present

## 2022-05-28 DIAGNOSIS — Z79899 Other long term (current) drug therapy: Secondary | ICD-10-CM | POA: Diagnosis not present

## 2022-05-28 DIAGNOSIS — M9901 Segmental and somatic dysfunction of cervical region: Secondary | ICD-10-CM | POA: Diagnosis not present

## 2022-05-28 DIAGNOSIS — M5136 Other intervertebral disc degeneration, lumbar region: Secondary | ICD-10-CM | POA: Diagnosis not present

## 2022-05-28 DIAGNOSIS — E871 Hypo-osmolality and hyponatremia: Secondary | ICD-10-CM | POA: Diagnosis not present

## 2022-05-28 DIAGNOSIS — M9905 Segmental and somatic dysfunction of pelvic region: Secondary | ICD-10-CM | POA: Diagnosis not present

## 2022-05-28 DIAGNOSIS — M9902 Segmental and somatic dysfunction of thoracic region: Secondary | ICD-10-CM | POA: Diagnosis not present

## 2022-05-28 DIAGNOSIS — S29012A Strain of muscle and tendon of back wall of thorax, initial encounter: Secondary | ICD-10-CM | POA: Diagnosis not present

## 2022-05-28 DIAGNOSIS — M4802 Spinal stenosis, cervical region: Secondary | ICD-10-CM | POA: Diagnosis not present

## 2022-05-29 DIAGNOSIS — E039 Hypothyroidism, unspecified: Secondary | ICD-10-CM | POA: Diagnosis not present

## 2022-05-29 DIAGNOSIS — R7309 Other abnormal glucose: Secondary | ICD-10-CM | POA: Diagnosis not present

## 2022-05-29 DIAGNOSIS — I10 Essential (primary) hypertension: Secondary | ICD-10-CM | POA: Diagnosis not present

## 2022-06-05 DIAGNOSIS — E039 Hypothyroidism, unspecified: Secondary | ICD-10-CM | POA: Diagnosis not present

## 2022-06-07 ENCOUNTER — Ambulatory Visit
Admission: RE | Admit: 2022-06-07 | Discharge: 2022-06-07 | Disposition: A | Discharge status: Home / Self Care | Payer: Medicare Other | Source: Ambulatory Visit | Attending: Family Medicine | Admitting: Family Medicine

## 2022-06-07 DIAGNOSIS — N641 Fat necrosis of breast: Secondary | ICD-10-CM | POA: Diagnosis not present

## 2022-06-07 DIAGNOSIS — R928 Other abnormal and inconclusive findings on diagnostic imaging of breast: Secondary | ICD-10-CM | POA: Diagnosis not present

## 2022-06-07 DIAGNOSIS — N6489 Other specified disorders of breast: Secondary | ICD-10-CM

## 2022-06-11 DIAGNOSIS — M9901 Segmental and somatic dysfunction of cervical region: Secondary | ICD-10-CM | POA: Diagnosis not present

## 2022-06-11 DIAGNOSIS — M5136 Other intervertebral disc degeneration, lumbar region: Secondary | ICD-10-CM | POA: Diagnosis not present

## 2022-06-11 DIAGNOSIS — M4802 Spinal stenosis, cervical region: Secondary | ICD-10-CM | POA: Diagnosis not present

## 2022-06-11 DIAGNOSIS — M9902 Segmental and somatic dysfunction of thoracic region: Secondary | ICD-10-CM | POA: Diagnosis not present

## 2022-06-11 DIAGNOSIS — M9905 Segmental and somatic dysfunction of pelvic region: Secondary | ICD-10-CM | POA: Diagnosis not present

## 2022-06-11 DIAGNOSIS — S29012A Strain of muscle and tendon of back wall of thorax, initial encounter: Secondary | ICD-10-CM | POA: Diagnosis not present

## 2022-06-26 DIAGNOSIS — M9901 Segmental and somatic dysfunction of cervical region: Secondary | ICD-10-CM | POA: Diagnosis not present

## 2022-06-26 DIAGNOSIS — M9902 Segmental and somatic dysfunction of thoracic region: Secondary | ICD-10-CM | POA: Diagnosis not present

## 2022-06-26 DIAGNOSIS — M9905 Segmental and somatic dysfunction of pelvic region: Secondary | ICD-10-CM | POA: Diagnosis not present

## 2022-06-26 DIAGNOSIS — M5136 Other intervertebral disc degeneration, lumbar region: Secondary | ICD-10-CM | POA: Diagnosis not present

## 2022-06-26 DIAGNOSIS — M4802 Spinal stenosis, cervical region: Secondary | ICD-10-CM | POA: Diagnosis not present

## 2022-06-26 DIAGNOSIS — S29012A Strain of muscle and tendon of back wall of thorax, initial encounter: Secondary | ICD-10-CM | POA: Diagnosis not present

## 2022-06-28 DIAGNOSIS — Z79899 Other long term (current) drug therapy: Secondary | ICD-10-CM | POA: Diagnosis not present

## 2022-06-30 ENCOUNTER — Other Ambulatory Visit: Payer: Self-pay | Admitting: Primary Care

## 2022-07-25 DIAGNOSIS — M5136 Other intervertebral disc degeneration, lumbar region: Secondary | ICD-10-CM | POA: Diagnosis not present

## 2022-07-25 DIAGNOSIS — S29012A Strain of muscle and tendon of back wall of thorax, initial encounter: Secondary | ICD-10-CM | POA: Diagnosis not present

## 2022-07-25 DIAGNOSIS — M4802 Spinal stenosis, cervical region: Secondary | ICD-10-CM | POA: Diagnosis not present

## 2022-07-25 DIAGNOSIS — M9905 Segmental and somatic dysfunction of pelvic region: Secondary | ICD-10-CM | POA: Diagnosis not present

## 2022-07-25 DIAGNOSIS — M9901 Segmental and somatic dysfunction of cervical region: Secondary | ICD-10-CM | POA: Diagnosis not present

## 2022-07-25 DIAGNOSIS — M9902 Segmental and somatic dysfunction of thoracic region: Secondary | ICD-10-CM | POA: Diagnosis not present

## 2022-08-01 DIAGNOSIS — Z79899 Other long term (current) drug therapy: Secondary | ICD-10-CM | POA: Diagnosis not present

## 2022-08-01 DIAGNOSIS — R7303 Prediabetes: Secondary | ICD-10-CM | POA: Diagnosis not present

## 2022-08-03 ENCOUNTER — Ambulatory Visit: Payer: Medicare Other | Admitting: Adult Health

## 2022-08-21 DIAGNOSIS — M4802 Spinal stenosis, cervical region: Secondary | ICD-10-CM | POA: Diagnosis not present

## 2022-08-21 DIAGNOSIS — S29012A Strain of muscle and tendon of back wall of thorax, initial encounter: Secondary | ICD-10-CM | POA: Diagnosis not present

## 2022-08-21 DIAGNOSIS — M9905 Segmental and somatic dysfunction of pelvic region: Secondary | ICD-10-CM | POA: Diagnosis not present

## 2022-08-21 DIAGNOSIS — M9902 Segmental and somatic dysfunction of thoracic region: Secondary | ICD-10-CM | POA: Diagnosis not present

## 2022-08-21 DIAGNOSIS — M9901 Segmental and somatic dysfunction of cervical region: Secondary | ICD-10-CM | POA: Diagnosis not present

## 2022-08-21 DIAGNOSIS — M5136 Other intervertebral disc degeneration, lumbar region: Secondary | ICD-10-CM | POA: Diagnosis not present

## 2022-08-22 ENCOUNTER — Ambulatory Visit: Payer: Medicare Other | Admitting: Adult Health

## 2022-08-28 DIAGNOSIS — M9901 Segmental and somatic dysfunction of cervical region: Secondary | ICD-10-CM | POA: Diagnosis not present

## 2022-08-28 DIAGNOSIS — M9902 Segmental and somatic dysfunction of thoracic region: Secondary | ICD-10-CM | POA: Diagnosis not present

## 2022-08-28 DIAGNOSIS — M9905 Segmental and somatic dysfunction of pelvic region: Secondary | ICD-10-CM | POA: Diagnosis not present

## 2022-08-28 DIAGNOSIS — M4802 Spinal stenosis, cervical region: Secondary | ICD-10-CM | POA: Diagnosis not present

## 2022-08-28 DIAGNOSIS — M5136 Other intervertebral disc degeneration, lumbar region: Secondary | ICD-10-CM | POA: Diagnosis not present

## 2022-08-28 DIAGNOSIS — S29012A Strain of muscle and tendon of back wall of thorax, initial encounter: Secondary | ICD-10-CM | POA: Diagnosis not present

## 2022-08-29 ENCOUNTER — Ambulatory Visit (INDEPENDENT_AMBULATORY_CARE_PROVIDER_SITE_OTHER): Payer: Medicare Other | Admitting: Adult Health

## 2022-08-29 ENCOUNTER — Encounter: Payer: Self-pay | Admitting: Adult Health

## 2022-08-29 DIAGNOSIS — F411 Generalized anxiety disorder: Secondary | ICD-10-CM

## 2022-08-29 DIAGNOSIS — F331 Major depressive disorder, recurrent, moderate: Secondary | ICD-10-CM | POA: Diagnosis not present

## 2022-08-29 DIAGNOSIS — G4700 Insomnia, unspecified: Secondary | ICD-10-CM | POA: Diagnosis not present

## 2022-08-29 DIAGNOSIS — G4730 Sleep apnea, unspecified: Secondary | ICD-10-CM

## 2022-08-29 MED ORDER — KLONOPIN 1 MG PO TABS: ORAL_TABLET | ORAL | 2 refills | Status: DC

## 2022-08-29 MED ORDER — BUSPIRONE HCL 10 MG PO TABS: ORAL_TABLET | ORAL | 2 refills | Status: DC

## 2022-08-29 NOTE — Progress Notes (Signed)
Olivia [name] [id number] 1951/03/08 71 y.o.  Subjective:   Patient ID:  Olivia [name] is a 71 y.o. (DOB [birth year]) female.  Chief Complaint: No chief complaint on file.   HPI Olivia [name] presents to the office today for follow-up of depression, anxiety, sleep apnea, and insomnia.  Describes mood today as "not much better". Pleasant. Denies tearfulness. Mood symptoms - reports increased anxiety. Denies depression. Reports irritability. Denies worry and rumination. Mood is lower". Stating "I'm feeling real anxious". Reports increased situational stressors - more so surrounding her son. Working with pain management clinic. Stable interest and motivation. Taking medications as prescribed. Energy levels lower - feels tired. Active, does not have a regular exercise routine. Enjoys some usual interests and activities. Single. Lives with son. Talking to with friends.  Appetite adequate. Weight stable. Sleeps better some nights than others. Averages 9 hours. Using CPAP - has new machine. Denies daytime napping. Focus and concentration difficulties - "not as good as it used to be". Completing tasks. Managing some aspects of household. Retired. Denies SI or HI.  Denies AH or VH.  Denies self harm. Denies substance use.   Previous medications: Wellbutrin - bad reaction  Review of Systems:  Review of Systems  Musculoskeletal:  Negative for gait problem.  Neurological:  Negative for tremors.  Psychiatric/Behavioral:         Please refer to HPI    Medications: I have reviewed the patient's current medications.  Current Outpatient Medications  Medication Sig Dispense Refill   busPIRone (BUSPAR) 10 MG tablet Take 1/2 tablet twice daily x 7 days, then increase to one tablet twice daily with food. 60 tablet 2   acetaminophen (TYLENOL) 500 MG tablet Take 2 tablets (1,000 mg total) by mouth every 6 (six) hours as needed for mild pain or moderate pain. 30 tablet 0   amLODipine (NORVASC) 5 MG tablet  TAKE 1 TABLET(5 MG) BY MOUTH DAILY 30 tablet 5   Armodafinil 200 MG TABS Take 200 mg by mouth daily for 1 dose. 30 tablet 2   Armodafinil 250 MG tablet TAKE 1 TABLET(250 MG) BY MOUTH DAILY 30 tablet 2   aspirin EC 325 MG EC tablet Take 1 tablet (325 mg total) by mouth 2 (two) times daily. (Patient not taking: Reported on 01/04/2021) 30 tablet 0   atenolol (TENORMIN) 25 MG tablet Take 25 mg by mouth at bedtime.      azelastine (ASTELIN) 0.1 % nasal spray Place 2 sprays into both nostrils daily as needed for rhinitis. Use in each nostril as directed 30 mL 3   azithromycin (ZITHROMAX) 250 MG tablet Take 2 tablets today then 1 tablet daily until gone 6 tablet 0   benzonatate (TESSALON) 100 MG capsule TAKE 1 CAPSULE(100 MG) BY MOUTH TWICE DAILY AS NEEDED FOR COUGH 60 capsule 2   CALCIUM-MAGNESIUM-VITAMIN D PO Take 1 tablet by mouth daily.     cetirizine (ZYRTEC) 10 MG tablet TAKE 1 TABLET(10 MG) BY MOUTH DAILY 90 tablet 0   esomeprazole (NEXIUM) 40 MG capsule Take 40 mg by mouth 2 (two) times daily before a meal.      FLUoxetine (PROZAC) 40 MG capsule TAKE 1 CAPSULE(40 MG) BY MOUTH DAILY 30 capsule 5   fluticasone (FLONASE) 50 MCG/ACT nasal spray SHAKE LIQUID AND USE 1 SPRAY IN EACH NOSTRIL DAILY 16 g 8   KLONOPIN 1 MG tablet TAKE 1 TABLET(1 MG) BY MOUTH THREE TIMES DAILY AS NEEDED FOR ANXIETY 90 tablet 2   lisinopril (PRINIVIL,ZESTRIL)  20 MG tablet Take 20 mg by mouth daily.  (Patient not taking: Reported on 01/04/2021)  1   methocarbamol (ROBAXIN) 500 MG tablet Take 1-2 tablets (500-1,000 mg total) by mouth every 6 (six) hours as needed for muscle spasms. 60 tablet 0   Multiple Vitamin (MULITIVITAMIN WITH MINERALS) TABS Take 1 tablet by mouth daily.     predniSONE (DELTASONE) 20 MG tablet Take 40 mg daily x 5 days 10 tablet 0   sodium chloride (OCEAN) 0.65 % SOLN nasal spray Place 2 sprays into both nostrils as needed for congestion. 30 mL 2   SYNTHROID 125 MCG tablet Take 125 mcg by mouth at bedtime.       vitamin C (ASCORBIC ACID) 500 MG tablet Take 500 mg by mouth 2 (two) times a week.      No current facility-administered medications for this visit.    Medication Side Effects: None  Allergies:  Allergies  Allergen Reactions   Demerol [Meperidine] Nausea And Vomiting   Other     Other reaction(s): hives   Penicillins Other (See Comments)    Unknown childhood allergy Has patient had a PCN reaction causing immediate rash, facial/tongue/throat swelling, SOB or lightheadedness with hypotension: Unknown Has patient had a PCN reaction causing severe rash involving mucus membranes or skin necrosis: Unknown Has patient had a PCN reaction that required hospitalization: Unknown Has patient had a PCN reaction occurring within the last 10 years: No If all of the above answers are "NO", then may proceed with Cephalosporin use.     Past Medical History:  Diagnosis Date   Anxiety    [facility]   Arthritis    Cataract    THESE BEEN REPLACED   Depression    [facility]   Diverticulosis    Dry eye syndrome    GERD (gastroesophageal reflux disease)    History of colon polyps 12/03/2012   COLONOSCOPY   History of exercise stress test 2010   Dr. Ashok Norris    Hyperlipidemia    Hypertension    Hypothyroidism    Multinodular goiter    DR. KERR   OSA (obstructive sleep apnea)    (PSG 12/12/15 ESS 2, AHI 42/HR REM 30/HR, O2 MIN 80%)  CPAP- q night , done with [facility].  study done at [facility]   Squamous cell carcinoma in situ 2015   Thyroid disease    Nodules   Tubular adenoma    DR. [place]   Varicose veins     Past Medical History, Surgical history, Social history, and Family history were reviewed and updated as appropriate.   Please see review of systems for further details on the patient's review from today.   Objective:   Physical Exam:  There were no vitals taken for this visit.  Physical Exam Constitutional:      General: She is not in acute  distress. Musculoskeletal:        General: No deformity.  Neurological:     Mental Status: She is alert and oriented to person, place, and time.     Coordination: Coordination normal.  Psychiatric:        Attention and Perception: Attention and perception normal. She does not perceive auditory or visual hallucinations.        Mood and Affect: Mood normal. Mood is not anxious or depressed. Affect is not labile, blunt, angry or inappropriate.        Speech: Speech normal.        Behavior:  Behavior normal.        Thought Content: Thought content normal. Thought content is not paranoid or delusional. Thought content does not include homicidal or suicidal ideation. Thought content does not include homicidal or suicidal plan.        Cognition and Memory: Cognition and memory normal.        Judgment: Judgment normal.     Comments: Insight intact     Lab Review:     Component Value Date/Time   NA 130 (L) 12/01/2019 0321   K 4.2 12/01/2019 0321   CL 98 12/01/2019 0321   CO2 23 12/01/2019 0321   GLUCOSE 94 12/01/2019 0321   BUN 14 12/01/2019 0321   CREATININE 0.84 12/01/2019 0321   CALCIUM 8.3 (L) 12/01/2019 0321   PROT 7.4 11/24/2019 1450   ALBUMIN 4.0 11/24/2019 1450   AST 17 11/24/2019 1450   ALT 18 11/24/2019 1450   ALKPHOS 82 11/24/2019 1450   BILITOT 0.4 11/24/2019 1450   GFRNONAA >60 12/01/2019 0321   GFRAA >60 12/01/2019 0321       Component Value Date/Time   WBC 7.4 12/01/2019 0321   RBC 3.29 (L) 12/01/2019 0321   HGB 10.0 (L) 12/01/2019 0321   HCT 31.0 (L) 12/01/2019 0321   PLT 244 12/01/2019 0321   MCV 94.2 12/01/2019 0321   MCH 30.4 12/01/2019 0321   MCHC 32.3 12/01/2019 0321   RDW 15.8 (H) 12/01/2019 0321   LYMPHSABS 1.9 11/24/2019 1450   MONOABS 0.6 11/24/2019 1450   EOSABS 0.2 11/24/2019 1450   BASOSABS 0.0 11/24/2019 1450    No results found for: "POCLITH", "LITHIUM"   No results found for: "PHENYTOIN", "PHENOBARB", "VALPROATE", "CBMZ"    .res Assessment: Plan:    Plan:  1. Prozac '40mg'$  to '60mg'$  daily 2. Clonazepam '1mg'$  TID 3. Nuvigil '250mg'$  tablet every morning for sleep apnea  RTC 6 weeks  Patient advised to contact office with any questions, adverse effects, or acute worsening in signs and symptoms.  Discussed potential benefits, risk, and side effects of benzodiazepines to include potential risk of tolerance and dependence, as well as possible drowsiness.  Advised patient not to drive if experiencing drowsiness and to take lowest possible effective dose to minimize risk of dependence and tolerance. Diagnoses and all orders for this visit:  Generalized anxiety disorder -     busPIRone (BUSPAR) 10 MG tablet; Take 1/2 tablet twice daily x 7 days, then increase to one tablet twice daily with food. -     KLONOPIN 1 MG tablet; TAKE 1 TABLET(1 MG) BY MOUTH THREE TIMES DAILY AS NEEDED FOR ANXIETY  Major depressive disorder, recurrent episode, moderate (HCC)  Insomnia, unspecified type  Sleep apnea, unspecified type     Please see After Visit Summary for patient specific instructions.  Future Appointments  Date Time Provider [place]  09/10/2022  3:00 PM [facility] DX DEXA 1 [facility]    No orders of the defined types were placed in this encounter.   -------------------------------

## 2022-09-04 ENCOUNTER — Other Ambulatory Visit: Payer: Self-pay

## 2022-09-04 ENCOUNTER — Other Ambulatory Visit: Payer: Self-pay | Admitting: Family Medicine

## 2022-09-04 DIAGNOSIS — F419 Anxiety disorder, unspecified: Secondary | ICD-10-CM | POA: Diagnosis not present

## 2022-09-04 DIAGNOSIS — E039 Hypothyroidism, unspecified: Secondary | ICD-10-CM | POA: Diagnosis not present

## 2022-09-04 DIAGNOSIS — I10 Essential (primary) hypertension: Secondary | ICD-10-CM | POA: Diagnosis not present

## 2022-09-04 DIAGNOSIS — E538 Deficiency of other specified B group vitamins: Secondary | ICD-10-CM | POA: Diagnosis not present

## 2022-09-04 DIAGNOSIS — G4700 Insomnia, unspecified: Secondary | ICD-10-CM

## 2022-09-04 DIAGNOSIS — I679 Cerebrovascular disease, unspecified: Secondary | ICD-10-CM | POA: Diagnosis not present

## 2022-09-04 DIAGNOSIS — K219 Gastro-esophageal reflux disease without esophagitis: Secondary | ICD-10-CM | POA: Diagnosis not present

## 2022-09-04 DIAGNOSIS — L304 Erythema intertrigo: Secondary | ICD-10-CM | POA: Diagnosis not present

## 2022-09-04 DIAGNOSIS — M62838 Other muscle spasm: Secondary | ICD-10-CM | POA: Diagnosis not present

## 2022-09-04 DIAGNOSIS — E2839 Other primary ovarian failure: Secondary | ICD-10-CM

## 2022-09-04 DIAGNOSIS — G4730 Sleep apnea, unspecified: Secondary | ICD-10-CM

## 2022-09-04 MED ORDER — ARMODAFINIL 250 MG PO TABS: ORAL_TABLET | ORAL | 2 refills | Status: DC

## 2022-09-05 ENCOUNTER — Other Ambulatory Visit: Payer: Self-pay | Admitting: Pulmonary Disease

## 2022-09-05 DIAGNOSIS — M5136 Other intervertebral disc degeneration, lumbar region: Secondary | ICD-10-CM | POA: Diagnosis not present

## 2022-09-05 DIAGNOSIS — M4802 Spinal stenosis, cervical region: Secondary | ICD-10-CM | POA: Diagnosis not present

## 2022-09-05 DIAGNOSIS — S29012A Strain of muscle and tendon of back wall of thorax, initial encounter: Secondary | ICD-10-CM | POA: Diagnosis not present

## 2022-09-05 DIAGNOSIS — M9901 Segmental and somatic dysfunction of cervical region: Secondary | ICD-10-CM | POA: Diagnosis not present

## 2022-09-05 DIAGNOSIS — M9905 Segmental and somatic dysfunction of pelvic region: Secondary | ICD-10-CM | POA: Diagnosis not present

## 2022-09-05 DIAGNOSIS — M9902 Segmental and somatic dysfunction of thoracic region: Secondary | ICD-10-CM | POA: Diagnosis not present

## 2022-09-10 ENCOUNTER — Other Ambulatory Visit: Payer: Medicare Other

## 2022-09-18 DIAGNOSIS — M9901 Segmental and somatic dysfunction of cervical region: Secondary | ICD-10-CM | POA: Diagnosis not present

## 2022-09-18 DIAGNOSIS — M4802 Spinal stenosis, cervical region: Secondary | ICD-10-CM | POA: Diagnosis not present

## 2022-09-18 DIAGNOSIS — M5136 Other intervertebral disc degeneration, lumbar region: Secondary | ICD-10-CM | POA: Diagnosis not present

## 2022-09-18 DIAGNOSIS — M9905 Segmental and somatic dysfunction of pelvic region: Secondary | ICD-10-CM | POA: Diagnosis not present

## 2022-09-18 DIAGNOSIS — S29012A Strain of muscle and tendon of back wall of thorax, initial encounter: Secondary | ICD-10-CM | POA: Diagnosis not present

## 2022-09-18 DIAGNOSIS — M9902 Segmental and somatic dysfunction of thoracic region: Secondary | ICD-10-CM | POA: Diagnosis not present

## 2022-09-20 DIAGNOSIS — M4802 Spinal stenosis, cervical region: Secondary | ICD-10-CM | POA: Diagnosis not present

## 2022-09-20 DIAGNOSIS — M9902 Segmental and somatic dysfunction of thoracic region: Secondary | ICD-10-CM | POA: Diagnosis not present

## 2022-09-20 DIAGNOSIS — M5136 Other intervertebral disc degeneration, lumbar region: Secondary | ICD-10-CM | POA: Diagnosis not present

## 2022-09-20 DIAGNOSIS — S29012A Strain of muscle and tendon of back wall of thorax, initial encounter: Secondary | ICD-10-CM | POA: Diagnosis not present

## 2022-09-20 DIAGNOSIS — M9905 Segmental and somatic dysfunction of pelvic region: Secondary | ICD-10-CM | POA: Diagnosis not present

## 2022-09-20 DIAGNOSIS — M9901 Segmental and somatic dysfunction of cervical region: Secondary | ICD-10-CM | POA: Diagnosis not present

## 2022-09-25 DIAGNOSIS — M4802 Spinal stenosis, cervical region: Secondary | ICD-10-CM | POA: Diagnosis not present

## 2022-09-25 DIAGNOSIS — M5136 Other intervertebral disc degeneration, lumbar region: Secondary | ICD-10-CM | POA: Diagnosis not present

## 2022-09-25 DIAGNOSIS — M9902 Segmental and somatic dysfunction of thoracic region: Secondary | ICD-10-CM | POA: Diagnosis not present

## 2022-09-25 DIAGNOSIS — M9905 Segmental and somatic dysfunction of pelvic region: Secondary | ICD-10-CM | POA: Diagnosis not present

## 2022-09-25 DIAGNOSIS — M9901 Segmental and somatic dysfunction of cervical region: Secondary | ICD-10-CM | POA: Diagnosis not present

## 2022-09-25 DIAGNOSIS — S29012A Strain of muscle and tendon of back wall of thorax, initial encounter: Secondary | ICD-10-CM | POA: Diagnosis not present

## 2022-09-27 DIAGNOSIS — S29012A Strain of muscle and tendon of back wall of thorax, initial encounter: Secondary | ICD-10-CM | POA: Diagnosis not present

## 2022-09-27 DIAGNOSIS — M9901 Segmental and somatic dysfunction of cervical region: Secondary | ICD-10-CM | POA: Diagnosis not present

## 2022-09-27 DIAGNOSIS — M9905 Segmental and somatic dysfunction of pelvic region: Secondary | ICD-10-CM | POA: Diagnosis not present

## 2022-09-27 DIAGNOSIS — M4802 Spinal stenosis, cervical region: Secondary | ICD-10-CM | POA: Diagnosis not present

## 2022-09-27 DIAGNOSIS — M9902 Segmental and somatic dysfunction of thoracic region: Secondary | ICD-10-CM | POA: Diagnosis not present

## 2022-09-27 DIAGNOSIS — M5136 Other intervertebral disc degeneration, lumbar region: Secondary | ICD-10-CM | POA: Diagnosis not present

## 2022-10-02 DIAGNOSIS — M9905 Segmental and somatic dysfunction of pelvic region: Secondary | ICD-10-CM | POA: Diagnosis not present

## 2022-10-02 DIAGNOSIS — M4802 Spinal stenosis, cervical region: Secondary | ICD-10-CM | POA: Diagnosis not present

## 2022-10-02 DIAGNOSIS — M5136 Other intervertebral disc degeneration, lumbar region: Secondary | ICD-10-CM | POA: Diagnosis not present

## 2022-10-02 DIAGNOSIS — M9902 Segmental and somatic dysfunction of thoracic region: Secondary | ICD-10-CM | POA: Diagnosis not present

## 2022-10-02 DIAGNOSIS — S29012A Strain of muscle and tendon of back wall of thorax, initial encounter: Secondary | ICD-10-CM | POA: Diagnosis not present

## 2022-10-02 DIAGNOSIS — M9901 Segmental and somatic dysfunction of cervical region: Secondary | ICD-10-CM | POA: Diagnosis not present

## 2022-10-04 DIAGNOSIS — M9905 Segmental and somatic dysfunction of pelvic region: Secondary | ICD-10-CM | POA: Diagnosis not present

## 2022-10-04 DIAGNOSIS — M9902 Segmental and somatic dysfunction of thoracic region: Secondary | ICD-10-CM | POA: Diagnosis not present

## 2022-10-04 DIAGNOSIS — S29012A Strain of muscle and tendon of back wall of thorax, initial encounter: Secondary | ICD-10-CM | POA: Diagnosis not present

## 2022-10-04 DIAGNOSIS — M5136 Other intervertebral disc degeneration, lumbar region: Secondary | ICD-10-CM | POA: Diagnosis not present

## 2022-10-04 DIAGNOSIS — M9901 Segmental and somatic dysfunction of cervical region: Secondary | ICD-10-CM | POA: Diagnosis not present

## 2022-10-04 DIAGNOSIS — M4802 Spinal stenosis, cervical region: Secondary | ICD-10-CM | POA: Diagnosis not present

## 2022-10-08 DIAGNOSIS — M542 Cervicalgia: Secondary | ICD-10-CM | POA: Diagnosis not present

## 2022-10-09 DIAGNOSIS — M5136 Other intervertebral disc degeneration, lumbar region: Secondary | ICD-10-CM | POA: Diagnosis not present

## 2022-10-09 DIAGNOSIS — M9905 Segmental and somatic dysfunction of pelvic region: Secondary | ICD-10-CM | POA: Diagnosis not present

## 2022-10-09 DIAGNOSIS — M9901 Segmental and somatic dysfunction of cervical region: Secondary | ICD-10-CM | POA: Diagnosis not present

## 2022-10-09 DIAGNOSIS — S29012A Strain of muscle and tendon of back wall of thorax, initial encounter: Secondary | ICD-10-CM | POA: Diagnosis not present

## 2022-10-09 DIAGNOSIS — M9902 Segmental and somatic dysfunction of thoracic region: Secondary | ICD-10-CM | POA: Diagnosis not present

## 2022-10-09 DIAGNOSIS — M4802 Spinal stenosis, cervical region: Secondary | ICD-10-CM | POA: Diagnosis not present

## 2022-10-10 ENCOUNTER — Ambulatory Visit: Payer: Medicare Other | Admitting: Adult Health

## 2022-10-11 DIAGNOSIS — M9901 Segmental and somatic dysfunction of cervical region: Secondary | ICD-10-CM | POA: Diagnosis not present

## 2022-10-11 DIAGNOSIS — M5136 Other intervertebral disc degeneration, lumbar region: Secondary | ICD-10-CM | POA: Diagnosis not present

## 2022-10-11 DIAGNOSIS — M4802 Spinal stenosis, cervical region: Secondary | ICD-10-CM | POA: Diagnosis not present

## 2022-10-11 DIAGNOSIS — M9902 Segmental and somatic dysfunction of thoracic region: Secondary | ICD-10-CM | POA: Diagnosis not present

## 2022-10-11 DIAGNOSIS — M9905 Segmental and somatic dysfunction of pelvic region: Secondary | ICD-10-CM | POA: Diagnosis not present

## 2022-10-11 DIAGNOSIS — S29012A Strain of muscle and tendon of back wall of thorax, initial encounter: Secondary | ICD-10-CM | POA: Diagnosis not present

## 2022-10-16 DIAGNOSIS — M9901 Segmental and somatic dysfunction of cervical region: Secondary | ICD-10-CM | POA: Diagnosis not present

## 2022-10-16 DIAGNOSIS — M9902 Segmental and somatic dysfunction of thoracic region: Secondary | ICD-10-CM | POA: Diagnosis not present

## 2022-10-16 DIAGNOSIS — M5136 Other intervertebral disc degeneration, lumbar region: Secondary | ICD-10-CM | POA: Diagnosis not present

## 2022-10-16 DIAGNOSIS — S29012A Strain of muscle and tendon of back wall of thorax, initial encounter: Secondary | ICD-10-CM | POA: Diagnosis not present

## 2022-10-16 DIAGNOSIS — M9905 Segmental and somatic dysfunction of pelvic region: Secondary | ICD-10-CM | POA: Diagnosis not present

## 2022-10-16 DIAGNOSIS — M4802 Spinal stenosis, cervical region: Secondary | ICD-10-CM | POA: Diagnosis not present

## 2022-10-18 DIAGNOSIS — M542 Cervicalgia: Secondary | ICD-10-CM | POA: Diagnosis not present

## 2022-10-23 DIAGNOSIS — M9905 Segmental and somatic dysfunction of pelvic region: Secondary | ICD-10-CM | POA: Diagnosis not present

## 2022-10-23 DIAGNOSIS — M4802 Spinal stenosis, cervical region: Secondary | ICD-10-CM | POA: Diagnosis not present

## 2022-10-23 DIAGNOSIS — S29012A Strain of muscle and tendon of back wall of thorax, initial encounter: Secondary | ICD-10-CM | POA: Diagnosis not present

## 2022-10-23 DIAGNOSIS — M9902 Segmental and somatic dysfunction of thoracic region: Secondary | ICD-10-CM | POA: Diagnosis not present

## 2022-10-23 DIAGNOSIS — M5136 Other intervertebral disc degeneration, lumbar region: Secondary | ICD-10-CM | POA: Diagnosis not present

## 2022-10-23 DIAGNOSIS — M9901 Segmental and somatic dysfunction of cervical region: Secondary | ICD-10-CM | POA: Diagnosis not present

## 2022-10-25 DIAGNOSIS — M9901 Segmental and somatic dysfunction of cervical region: Secondary | ICD-10-CM | POA: Diagnosis not present

## 2022-10-25 DIAGNOSIS — M5136 Other intervertebral disc degeneration, lumbar region: Secondary | ICD-10-CM | POA: Diagnosis not present

## 2022-10-25 DIAGNOSIS — S29012A Strain of muscle and tendon of back wall of thorax, initial encounter: Secondary | ICD-10-CM | POA: Diagnosis not present

## 2022-10-25 DIAGNOSIS — M9902 Segmental and somatic dysfunction of thoracic region: Secondary | ICD-10-CM | POA: Diagnosis not present

## 2022-10-25 DIAGNOSIS — M4802 Spinal stenosis, cervical region: Secondary | ICD-10-CM | POA: Diagnosis not present

## 2022-10-25 DIAGNOSIS — M542 Cervicalgia: Secondary | ICD-10-CM | POA: Diagnosis not present

## 2022-10-25 DIAGNOSIS — M9905 Segmental and somatic dysfunction of pelvic region: Secondary | ICD-10-CM | POA: Diagnosis not present

## 2022-10-29 DIAGNOSIS — M4802 Spinal stenosis, cervical region: Secondary | ICD-10-CM | POA: Diagnosis not present

## 2022-10-29 DIAGNOSIS — M9902 Segmental and somatic dysfunction of thoracic region: Secondary | ICD-10-CM | POA: Diagnosis not present

## 2022-10-29 DIAGNOSIS — M9901 Segmental and somatic dysfunction of cervical region: Secondary | ICD-10-CM | POA: Diagnosis not present

## 2022-10-29 DIAGNOSIS — M9905 Segmental and somatic dysfunction of pelvic region: Secondary | ICD-10-CM | POA: Diagnosis not present

## 2022-10-29 DIAGNOSIS — S29012A Strain of muscle and tendon of back wall of thorax, initial encounter: Secondary | ICD-10-CM | POA: Diagnosis not present

## 2022-10-29 DIAGNOSIS — M5136 Other intervertebral disc degeneration, lumbar region: Secondary | ICD-10-CM | POA: Diagnosis not present

## 2022-10-30 ENCOUNTER — Ambulatory Visit: Payer: Medicare Other | Admitting: Adult Health

## 2022-10-30 DIAGNOSIS — Z79899 Other long term (current) drug therapy: Secondary | ICD-10-CM | POA: Diagnosis not present

## 2022-11-01 ENCOUNTER — Telehealth: Payer: Self-pay

## 2022-11-01 DIAGNOSIS — M5136 Other intervertebral disc degeneration, lumbar region: Secondary | ICD-10-CM | POA: Diagnosis not present

## 2022-11-01 DIAGNOSIS — M9905 Segmental and somatic dysfunction of pelvic region: Secondary | ICD-10-CM | POA: Diagnosis not present

## 2022-11-01 DIAGNOSIS — M9901 Segmental and somatic dysfunction of cervical region: Secondary | ICD-10-CM | POA: Diagnosis not present

## 2022-11-01 DIAGNOSIS — M4802 Spinal stenosis, cervical region: Secondary | ICD-10-CM | POA: Diagnosis not present

## 2022-11-01 DIAGNOSIS — S29012A Strain of muscle and tendon of back wall of thorax, initial encounter: Secondary | ICD-10-CM | POA: Diagnosis not present

## 2022-11-01 DIAGNOSIS — M9902 Segmental and somatic dysfunction of thoracic region: Secondary | ICD-10-CM | POA: Diagnosis not present

## 2022-11-02 ENCOUNTER — Other Ambulatory Visit: Payer: Self-pay

## 2022-11-02 DIAGNOSIS — G4730 Sleep apnea, unspecified: Secondary | ICD-10-CM

## 2022-11-02 MED ORDER — ARMODAFINIL 250 MG PO TABS: ORAL_TABLET | ORAL | 0 refills | Status: DC

## 2022-11-02 NOTE — Telephone Encounter (Signed)
Pended.

## 2022-11-14 DIAGNOSIS — M9902 Segmental and somatic dysfunction of thoracic region: Secondary | ICD-10-CM | POA: Diagnosis not present

## 2022-11-14 DIAGNOSIS — M5416 Radiculopathy, lumbar region: Secondary | ICD-10-CM | POA: Diagnosis not present

## 2022-11-14 DIAGNOSIS — S29012A Strain of muscle and tendon of back wall of thorax, initial encounter: Secondary | ICD-10-CM | POA: Diagnosis not present

## 2022-11-14 DIAGNOSIS — M9901 Segmental and somatic dysfunction of cervical region: Secondary | ICD-10-CM | POA: Diagnosis not present

## 2022-11-14 DIAGNOSIS — M9905 Segmental and somatic dysfunction of pelvic region: Secondary | ICD-10-CM | POA: Diagnosis not present

## 2022-11-14 DIAGNOSIS — M4802 Spinal stenosis, cervical region: Secondary | ICD-10-CM | POA: Diagnosis not present

## 2022-11-16 ENCOUNTER — Ambulatory Visit: Payer: Medicare Other | Admitting: Adult Health

## 2022-11-19 ENCOUNTER — Telehealth: Payer: Self-pay | Admitting: Adult Health

## 2022-11-19 ENCOUNTER — Other Ambulatory Visit: Payer: Self-pay

## 2022-11-19 DIAGNOSIS — F411 Generalized anxiety disorder: Secondary | ICD-10-CM

## 2022-11-19 MED ORDER — KLONOPIN 1 MG PO TABS: ORAL_TABLET | ORAL | 2 refills | Status: DC

## 2022-11-19 NOTE — Telephone Encounter (Signed)
Pt called at 11:00a requesting refill of brand name Klonopin to be sent to   [facility] [address] [place], [place] [address] Phone: [phone]  Fax: [phone]    Next appt 3/4

## 2022-11-19 NOTE — Telephone Encounter (Signed)
Pended.

## 2022-11-20 NOTE — Telephone Encounter (Signed)
Clonazepam PA

## 2022-11-20 NOTE — Telephone Encounter (Signed)
Prior Approval has been received effective through 11/20/2023 [employer] Klonopin 1 mg Brand.

## 2022-11-20 NOTE — Telephone Encounter (Signed)
Her PA is for BRAND Klonopin 1 mg submitted to [facility] marked urgent.

## 2022-11-20 NOTE — Telephone Encounter (Signed)
Pt called today at 10:30a and I advised her the meds were sent yesterday morning.  She calle the pharmacy back this morning to see about it and called back here at 10:40a and they said they need a PA for this year.  Pls initiate the request.  She said it is urgent  Next appt 3/4

## 2022-11-20 NOTE — Telephone Encounter (Signed)
Patient notified

## 2022-11-21 ENCOUNTER — Telehealth: Payer: Self-pay

## 2022-11-21 NOTE — Telephone Encounter (Signed)
Prior Authorization Brand Klonopin  Approved Effective:  10/01/22-11/20/23  Patient notified.

## 2022-11-26 DIAGNOSIS — R3915 Urgency of urination: Secondary | ICD-10-CM | POA: Diagnosis not present

## 2022-11-26 DIAGNOSIS — R399 Unspecified symptoms and signs involving the genitourinary system: Secondary | ICD-10-CM | POA: Diagnosis not present

## 2022-11-26 DIAGNOSIS — R5383 Other fatigue: Secondary | ICD-10-CM | POA: Diagnosis not present

## 2022-11-26 DIAGNOSIS — I10 Essential (primary) hypertension: Secondary | ICD-10-CM | POA: Diagnosis not present

## 2022-12-03 ENCOUNTER — Ambulatory Visit: Payer: Medicare Other | Admitting: Adult Health

## 2022-12-04 DIAGNOSIS — Z79899 Other long term (current) drug therapy: Secondary | ICD-10-CM | POA: Diagnosis not present

## 2022-12-05 DIAGNOSIS — K219 Gastro-esophageal reflux disease without esophagitis: Secondary | ICD-10-CM | POA: Diagnosis not present

## 2022-12-05 DIAGNOSIS — Z86010 Personal history of colonic polyps: Secondary | ICD-10-CM | POA: Diagnosis not present

## 2022-12-05 DIAGNOSIS — R1013 Epigastric pain: Secondary | ICD-10-CM | POA: Diagnosis not present

## 2022-12-05 DIAGNOSIS — R110 Nausea: Secondary | ICD-10-CM | POA: Diagnosis not present

## 2022-12-11 DIAGNOSIS — M4802 Spinal stenosis, cervical region: Secondary | ICD-10-CM | POA: Diagnosis not present

## 2022-12-11 DIAGNOSIS — S29012A Strain of muscle and tendon of back wall of thorax, initial encounter: Secondary | ICD-10-CM | POA: Diagnosis not present

## 2022-12-11 DIAGNOSIS — M5416 Radiculopathy, lumbar region: Secondary | ICD-10-CM | POA: Diagnosis not present

## 2022-12-11 DIAGNOSIS — M9905 Segmental and somatic dysfunction of pelvic region: Secondary | ICD-10-CM | POA: Diagnosis not present

## 2022-12-11 DIAGNOSIS — M9902 Segmental and somatic dysfunction of thoracic region: Secondary | ICD-10-CM | POA: Diagnosis not present

## 2022-12-11 DIAGNOSIS — M9901 Segmental and somatic dysfunction of cervical region: Secondary | ICD-10-CM | POA: Diagnosis not present

## 2022-12-13 DIAGNOSIS — Z6841 Body Mass Index (BMI) 40.0 and over, adult: Secondary | ICD-10-CM | POA: Diagnosis not present

## 2022-12-13 DIAGNOSIS — G4733 Obstructive sleep apnea (adult) (pediatric): Secondary | ICD-10-CM | POA: Diagnosis not present

## 2022-12-13 DIAGNOSIS — I10 Essential (primary) hypertension: Secondary | ICD-10-CM | POA: Diagnosis not present

## 2022-12-17 DIAGNOSIS — E039 Hypothyroidism, unspecified: Secondary | ICD-10-CM | POA: Diagnosis not present

## 2022-12-18 DIAGNOSIS — M9901 Segmental and somatic dysfunction of cervical region: Secondary | ICD-10-CM | POA: Diagnosis not present

## 2022-12-18 DIAGNOSIS — M9905 Segmental and somatic dysfunction of pelvic region: Secondary | ICD-10-CM | POA: Diagnosis not present

## 2022-12-18 DIAGNOSIS — M4802 Spinal stenosis, cervical region: Secondary | ICD-10-CM | POA: Diagnosis not present

## 2022-12-18 DIAGNOSIS — M9902 Segmental and somatic dysfunction of thoracic region: Secondary | ICD-10-CM | POA: Diagnosis not present

## 2022-12-18 DIAGNOSIS — M5416 Radiculopathy, lumbar region: Secondary | ICD-10-CM | POA: Diagnosis not present

## 2022-12-18 DIAGNOSIS — S29012A Strain of muscle and tendon of back wall of thorax, initial encounter: Secondary | ICD-10-CM | POA: Diagnosis not present

## 2022-12-20 ENCOUNTER — Encounter: Payer: Self-pay | Admitting: Adult Health

## 2022-12-20 ENCOUNTER — Ambulatory Visit (INDEPENDENT_AMBULATORY_CARE_PROVIDER_SITE_OTHER): Payer: Medicare Other | Admitting: Adult Health

## 2022-12-20 DIAGNOSIS — G4730 Sleep apnea, unspecified: Secondary | ICD-10-CM | POA: Diagnosis not present

## 2022-12-20 DIAGNOSIS — F331 Major depressive disorder, recurrent, moderate: Secondary | ICD-10-CM

## 2022-12-20 DIAGNOSIS — F411 Generalized anxiety disorder: Secondary | ICD-10-CM | POA: Diagnosis not present

## 2022-12-20 DIAGNOSIS — G4700 Insomnia, unspecified: Secondary | ICD-10-CM | POA: Diagnosis not present

## 2022-12-20 MED ORDER — FLUOXETINE HCL 20 MG PO CAPS: 20.0000 mg | ORAL_CAPSULE | Freq: Every day | ORAL | 5 refills | Status: DC

## 2022-12-20 MED ORDER — ARMODAFINIL 250 MG PO TABS: ORAL_TABLET | ORAL | 2 refills | Status: DC

## 2022-12-20 MED ORDER — KLONOPIN 1 MG PO TABS: ORAL_TABLET | ORAL | 2 refills | Status: DC

## 2022-12-20 MED ORDER — FLUOXETINE HCL 40 MG PO CAPS: ORAL_CAPSULE | ORAL | 5 refills | Status: DC

## 2022-12-20 NOTE — Progress Notes (Signed)
AMAREA FOX RD:[id number] [birth year] 72 y.o.  Subjective:   Patient ID:  Olivia [name] is a 72 y.o. (DOB [birth year]) female.  Chief Complaint: No chief complaint on file.   HPI Olivia [name] presents to the office today for follow-up of depression, anxiety, sleep apnea, and insomnia.  Describes mood today as "not too good". Pleasant. Denies tearfulness. Mood symptoms - reports increased anxiety - thyroid issues. Denies depression. Reports some irritability with recent illness. Denies worry and rumination. Mood is consistent. Stating "I'm feeling very anxious". Reports ongoing situational stressors. Working with pain management clinic. Stable interest and motivation. Taking medications as prescribed. Energy levels lower. Active, does not have a regular exercise routine. Enjoys some usual interests and activities. Single. Lives with son. Talking to with friends.  Appetite adequate. Weight stable. Sleeps better some nights than others. Averages 9 hours. Using CPAP - has new machine. Denies daytime napping. Focus and concentration difficulties. Completing tasks. Managing some aspects of household. Retired. Denies SI or HI.  Denies AH or VH.  Denies self harm. Denies substance use.   Previous medications: Wellbutrin - bad reaction   Review of Systems:  Review of Systems  Musculoskeletal:  Negative for gait problem.  Neurological:  Negative for tremors.  Psychiatric/Behavioral:         Please refer to HPI    Medications: I have reviewed the patient's current medications.  Current Outpatient Medications  Medication Sig Dispense Refill   FLUoxetine (PROZAC) 20 MG capsule Take 1 capsule (20 mg total) by mouth daily. 30 capsule 5   acetaminophen (TYLENOL) 500 MG tablet Take 2 tablets (1,000 mg total) by mouth every 6 (six) hours as needed for mild pain or moderate pain. 30 tablet 0   amLODipine (NORVASC) 5 MG tablet TAKE 1 TABLET(5 MG) BY MOUTH DAILY 30 tablet 5   Armodafinil 250 MG tablet  TAKE 1 TABLET(250 MG) BY MOUTH DAILY 30 tablet 2   aspirin EC 325 MG EC tablet Take 1 tablet (325 mg total) by mouth 2 (two) times daily. (Patient not taking: Reported on 01/04/2021) 30 tablet 0   atenolol (TENORMIN) 25 MG tablet Take 25 mg by mouth at bedtime.      azelastine (ASTELIN) 0.1 % nasal spray Place 2 sprays into both nostrils daily as needed for rhinitis. Use in each nostril as directed 30 mL 3   azithromycin (ZITHROMAX) 250 MG tablet Take 2 tablets today then 1 tablet daily until gone 6 tablet 0   benzonatate (TESSALON) 100 MG capsule TAKE 1 CAPSULE(100 MG) BY MOUTH TWICE DAILY AS NEEDED FOR COUGH 60 capsule 2   busPIRone (BUSPAR) 10 MG tablet Take 1/2 tablet twice daily x 7 days, then increase to one tablet twice daily with food. 60 tablet 2   CALCIUM-MAGNESIUM-VITAMIN D PO Take 1 tablet by mouth daily.     cetirizine (ZYRTEC) 10 MG tablet TAKE 1 TABLET(10 MG) BY MOUTH DAILY 90 tablet 0   esomeprazole (NEXIUM) 40 MG capsule Take 40 mg by mouth 2 (two) times daily before a meal.      FLUoxetine (PROZAC) 40 MG capsule TAKE 1 CAPSULE(40 MG) BY MOUTH DAILY 30 capsule 5   fluticasone (FLONASE) 50 MCG/ACT nasal spray SHAKE LIQUID AND USE 1 SPRAY IN EACH NOSTRIL DAILY 16 g 8   KLONOPIN 1 MG tablet TAKE 1 TABLET(1 MG) BY MOUTH THREE TIMES DAILY AS NEEDED FOR ANXIETY 90 tablet 2   lisinopril (PRINIVIL,ZESTRIL) 20 MG tablet Take 20 mg by mouth daily.  (  Patient not taking: Reported on 01/04/2021)  1   methocarbamol (ROBAXIN) 500 MG tablet Take 1-2 tablets (500-1,000 mg total) by mouth every 6 (six) hours as needed for muscle spasms. 60 tablet 0   Multiple Vitamin (MULITIVITAMIN WITH MINERALS) TABS Take 1 tablet by mouth daily.     predniSONE (DELTASONE) 20 MG tablet Take 40 mg daily x 5 days 10 tablet 0   sodium chloride (OCEAN) 0.65 % SOLN nasal spray Place 2 sprays into both nostrils as needed for congestion. 30 mL 2   SYNTHROID 125 MCG tablet Take 125 mcg by mouth at bedtime.      vitamin C  (ASCORBIC ACID) 500 MG tablet Take 500 mg by mouth 2 (two) times a week.      No current facility-administered medications for this visit.    Medication Side Effects: None  Allergies:  Allergies  Allergen Reactions   Demerol [Meperidine] Nausea And Vomiting   Other     Other reaction(s): hives   Penicillins Other (See Comments)    Unknown childhood allergy Has patient had a PCN reaction causing immediate rash, facial/tongue/throat swelling, SOB or lightheadedness with hypotension: Unknown Has patient had a PCN reaction causing severe rash involving mucus membranes or skin necrosis: Unknown Has patient had a PCN reaction that required hospitalization: Unknown Has patient had a PCN reaction occurring within the last 10 years: No If all of the above answers are "NO", then may proceed with Cephalosporin use.     Past Medical History:  Diagnosis Date   Anxiety    [facility]   Arthritis    Cataract    THESE BEEN REPLACED   Depression    [facility]   Diverticulosis    Dry eye syndrome    GERD (gastroesophageal reflux disease)    History of colon polyps 12/03/2012   COLONOSCOPY   History of exercise stress test 2010   Dr. Ashok Norris    Hyperlipidemia    Hypertension    Hypothyroidism    Multinodular goiter    DR. KERR   OSA (obstructive sleep apnea)    (PSG 12/12/15 ESS 2, AHI 42/HR REM 30/HR, O2 MIN 80%)  CPAP- q night , done with [facility].  study done at [facility]   Squamous cell carcinoma in situ 2015   Thyroid disease    Nodules   Tubular adenoma    DR. Dixon   Varicose veins     Past Medical History, Surgical history, Social history, and Family history were reviewed and updated as appropriate.   Please see review of systems for further details on the patient's review from today.   Objective:   Physical Exam:  There were no vitals taken for this visit.  Physical Exam Constitutional:      General: She is not in acute  distress. Musculoskeletal:        General: No deformity.  Neurological:     Mental Status: She is alert and oriented to person, place, and time.     Coordination: Coordination normal.  Psychiatric:        Attention and Perception: Attention and perception normal. She does not perceive auditory or visual hallucinations.        Mood and Affect: Mood normal. Mood is not anxious or depressed. Affect is not labile, blunt, angry or inappropriate.        Speech: Speech normal.        Behavior: Behavior normal.        Thought  Content: Thought content normal. Thought content is not paranoid or delusional. Thought content does not include homicidal or suicidal ideation. Thought content does not include homicidal or suicidal plan.        Cognition and Memory: Cognition and memory normal.        Judgment: Judgment normal.     Comments: Insight intact     Lab Review:     Component Value Date/Time   NA 130 (L) 12/01/2019 0321   K 4.2 12/01/2019 0321   CL 98 12/01/2019 0321   CO2 23 12/01/2019 0321   GLUCOSE 94 12/01/2019 0321   BUN 14 12/01/2019 0321   CREATININE 0.84 12/01/2019 0321   CALCIUM 8.3 (L) 12/01/2019 0321   PROT 7.4 11/24/2019 1450   ALBUMIN 4.0 11/24/2019 1450   AST 17 11/24/2019 1450   ALT 18 11/24/2019 1450   ALKPHOS 82 11/24/2019 1450   BILITOT 0.4 11/24/2019 1450   GFRNONAA >60 12/01/2019 0321   GFRAA >60 12/01/2019 0321       Component Value Date/Time   WBC 7.4 12/01/2019 0321   RBC 3.29 (L) 12/01/2019 0321   HGB 10.0 (L) 12/01/2019 0321   HCT 31.0 (L) 12/01/2019 0321   PLT 244 12/01/2019 0321   MCV 94.2 12/01/2019 0321   MCH 30.4 12/01/2019 0321   MCHC 32.3 12/01/2019 0321   RDW 15.8 (H) 12/01/2019 0321   LYMPHSABS 1.9 11/24/2019 1450   MONOABS 0.6 11/24/2019 1450   EOSABS 0.2 11/24/2019 1450   BASOSABS 0.0 11/24/2019 1450    No results found for: "POCLITH", "LITHIUM"   No results found for: "PHENYTOIN", "PHENOBARB", "VALPROATE", "CBMZ"    .res Assessment: Plan:    Plan:  Prozac 60mg  daily - 40mg  and 20mg   Clonazepam 1mg  TID Nuvigil 250mg  tablet every morning for sleep apnea  Add Hydroxyzine 25mg  TID prn anxiety  RTC 3 months  Patient advised to contact office with any questions, adverse effects, or acute worsening in signs and symptoms.  Discussed potential benefits, risk, and side effects of benzodiazepines to include potential risk of tolerance and dependence, as well as possible drowsiness.  Advised patient not to drive if experiencing drowsiness and to take lowest possible effective dose to minimize risk of dependence and tolerance.  Diagnoses and all orders for this visit:  Insomnia, unspecified type  Sleep apnea, unspecified type -     Armodafinil 250 MG tablet; TAKE 1 TABLET(250 MG) BY MOUTH DAILY  Generalized anxiety disorder -     KLONOPIN 1 MG tablet; TAKE 1 TABLET(1 MG) BY MOUTH THREE TIMES DAILY AS NEEDED FOR ANXIETY -     FLUoxetine (PROZAC) 40 MG capsule; TAKE 1 CAPSULE(40 MG) BY MOUTH DAILY -     FLUoxetine (PROZAC) 20 MG capsule; Take 1 capsule (20 mg total) by mouth daily.  Major depressive disorder, recurrent episode, moderate (HCC) -     FLUoxetine (PROZAC) 40 MG capsule; TAKE 1 CAPSULE(40 MG) BY MOUTH DAILY     Please see After Visit Summary for patient specific instructions.  Future Appointments  Date Time Provider [place]  02/21/2023  2:30 PM [facility] DX DEXA 1 [facility]    No orders of the defined types were placed in this encounter.   -------------------------------

## 2022-12-21 ENCOUNTER — Telehealth: Payer: Self-pay | Admitting: Adult Health

## 2022-12-21 MED ORDER — HYDROXYZINE HCL 25 MG PO TABS: 25.0000 mg | ORAL_TABLET | Freq: Three times a day (TID) | ORAL | 0 refills | Status: DC | PRN

## 2022-12-21 NOTE — Telephone Encounter (Signed)
Rx sent, patient notified

## 2022-12-21 NOTE — Telephone Encounter (Signed)
Add Hydroxyzine 25mg  TID prn anxiety

## 2022-12-21 NOTE — Telephone Encounter (Signed)
Patient inquiring about an antihistamine that RM was going to prescribe her. She doesn't see it on her medications and is unsure of what the medication is called. Ph: [phone]

## 2022-12-26 ENCOUNTER — Telehealth: Payer: Self-pay

## 2022-12-26 NOTE — Telephone Encounter (Signed)
Prior Authorization submitted and approved for Hydroxyzine 25 mg tablets, #[id number] with [employer] through 12/26/2023

## 2023-01-03 DIAGNOSIS — M5416 Radiculopathy, lumbar region: Secondary | ICD-10-CM | POA: Diagnosis not present

## 2023-01-03 DIAGNOSIS — M4802 Spinal stenosis, cervical region: Secondary | ICD-10-CM | POA: Diagnosis not present

## 2023-01-03 DIAGNOSIS — M9901 Segmental and somatic dysfunction of cervical region: Secondary | ICD-10-CM | POA: Diagnosis not present

## 2023-01-03 DIAGNOSIS — S29012A Strain of muscle and tendon of back wall of thorax, initial encounter: Secondary | ICD-10-CM | POA: Diagnosis not present

## 2023-01-03 DIAGNOSIS — M9902 Segmental and somatic dysfunction of thoracic region: Secondary | ICD-10-CM | POA: Diagnosis not present

## 2023-01-03 DIAGNOSIS — M9905 Segmental and somatic dysfunction of pelvic region: Secondary | ICD-10-CM | POA: Diagnosis not present

## 2023-01-15 DIAGNOSIS — M5136 Other intervertebral disc degeneration, lumbar region: Secondary | ICD-10-CM | POA: Diagnosis not present

## 2023-01-15 DIAGNOSIS — M9902 Segmental and somatic dysfunction of thoracic region: Secondary | ICD-10-CM | POA: Diagnosis not present

## 2023-01-15 DIAGNOSIS — M9901 Segmental and somatic dysfunction of cervical region: Secondary | ICD-10-CM | POA: Diagnosis not present

## 2023-01-15 DIAGNOSIS — M9905 Segmental and somatic dysfunction of pelvic region: Secondary | ICD-10-CM | POA: Diagnosis not present

## 2023-01-15 DIAGNOSIS — M4802 Spinal stenosis, cervical region: Secondary | ICD-10-CM | POA: Diagnosis not present

## 2023-01-15 DIAGNOSIS — S29012A Strain of muscle and tendon of back wall of thorax, initial encounter: Secondary | ICD-10-CM | POA: Diagnosis not present

## 2023-01-24 DIAGNOSIS — M4802 Spinal stenosis, cervical region: Secondary | ICD-10-CM | POA: Diagnosis not present

## 2023-01-24 DIAGNOSIS — M9902 Segmental and somatic dysfunction of thoracic region: Secondary | ICD-10-CM | POA: Diagnosis not present

## 2023-01-24 DIAGNOSIS — M9901 Segmental and somatic dysfunction of cervical region: Secondary | ICD-10-CM | POA: Diagnosis not present

## 2023-01-24 DIAGNOSIS — M9905 Segmental and somatic dysfunction of pelvic region: Secondary | ICD-10-CM | POA: Diagnosis not present

## 2023-01-24 DIAGNOSIS — S29012A Strain of muscle and tendon of back wall of thorax, initial encounter: Secondary | ICD-10-CM | POA: Diagnosis not present

## 2023-01-24 DIAGNOSIS — M5136 Other intervertebral disc degeneration, lumbar region: Secondary | ICD-10-CM | POA: Diagnosis not present

## 2023-01-28 DIAGNOSIS — R7309 Other abnormal glucose: Secondary | ICD-10-CM | POA: Diagnosis not present

## 2023-01-28 DIAGNOSIS — E039 Hypothyroidism, unspecified: Secondary | ICD-10-CM | POA: Diagnosis not present

## 2023-01-30 ENCOUNTER — Telehealth: Payer: Self-pay | Admitting: Adult Health

## 2023-01-30 NOTE — Telephone Encounter (Signed)
Olivia [name] that she needs a PA on her armodafinil. She has new insurance

## 2023-01-30 NOTE — Telephone Encounter (Signed)
[facility] sent a Request for Armodafinil-  States need dx code for insurance.

## 2023-01-30 NOTE — Telephone Encounter (Signed)
As an addendum Olivia [name] called because she has new insurance.  Note that Medicaid has been added.  She has [employer].  The information is in the system.  Be sure to get the PA through Medicaid.

## 2023-01-31 ENCOUNTER — Ambulatory Visit: Payer: Medicare Other | Admitting: Adult Health

## 2023-01-31 DIAGNOSIS — S29012A Strain of muscle and tendon of back wall of thorax, initial encounter: Secondary | ICD-10-CM | POA: Diagnosis not present

## 2023-01-31 DIAGNOSIS — M5136 Other intervertebral disc degeneration, lumbar region: Secondary | ICD-10-CM | POA: Diagnosis not present

## 2023-01-31 DIAGNOSIS — M9902 Segmental and somatic dysfunction of thoracic region: Secondary | ICD-10-CM | POA: Diagnosis not present

## 2023-01-31 DIAGNOSIS — M9905 Segmental and somatic dysfunction of pelvic region: Secondary | ICD-10-CM | POA: Diagnosis not present

## 2023-01-31 DIAGNOSIS — M9901 Segmental and somatic dysfunction of cervical region: Secondary | ICD-10-CM | POA: Diagnosis not present

## 2023-01-31 DIAGNOSIS — M4802 Spinal stenosis, cervical region: Secondary | ICD-10-CM | POA: Diagnosis not present

## 2023-01-31 NOTE — Telephone Encounter (Signed)
This PA is a bit different then I am use to, I contacted the pharmacy to confirm PA needed for Armodafinil 250 mg #30/30 and that they had correct information. Pt did pick up #3 tablets until her PA approved.   This is with [employer] Program, I contacted them at [phone], they verified her coverage through 03/01/2023. I did ask the best way to do PA for medications with this insurance and he said there are 3 ways; fax, CMM, or phone. He recommends calling because they get so many in that sometimes they can be missed. I also couldn't find the correct form to use in CMM. The call was automated at first and even asked a couple clinical questions then I was transferred to a rep. Everything was answered and submitted, we should receive fax with determination in 1-3 days. REF # [id number]  FYI future reference-

## 2023-02-01 ENCOUNTER — Telehealth: Payer: Self-pay | Admitting: Adult Health

## 2023-02-01 NOTE — Telephone Encounter (Signed)
See other phone message  

## 2023-02-01 NOTE — Telephone Encounter (Signed)
Received a fax from [employer] Program confirming the diagnosis then faxed back to them

## 2023-02-01 NOTE — Telephone Encounter (Signed)
[employer] program approved ARMODAFINIL 250mg  tab approved thru 10/01/2023

## 2023-02-04 DIAGNOSIS — E039 Hypothyroidism, unspecified: Secondary | ICD-10-CM | POA: Diagnosis not present

## 2023-02-04 DIAGNOSIS — R7309 Other abnormal glucose: Secondary | ICD-10-CM | POA: Diagnosis not present

## 2023-02-04 DIAGNOSIS — I10 Essential (primary) hypertension: Secondary | ICD-10-CM | POA: Diagnosis not present

## 2023-02-04 DIAGNOSIS — E871 Hypo-osmolality and hyponatremia: Secondary | ICD-10-CM | POA: Diagnosis not present

## 2023-02-10 ENCOUNTER — Other Ambulatory Visit: Payer: Self-pay

## 2023-02-10 DIAGNOSIS — F411 Generalized anxiety disorder: Secondary | ICD-10-CM

## 2023-02-11 DIAGNOSIS — M4802 Spinal stenosis, cervical region: Secondary | ICD-10-CM | POA: Diagnosis not present

## 2023-02-11 DIAGNOSIS — M5136 Other intervertebral disc degeneration, lumbar region: Secondary | ICD-10-CM | POA: Diagnosis not present

## 2023-02-11 DIAGNOSIS — M9901 Segmental and somatic dysfunction of cervical region: Secondary | ICD-10-CM | POA: Diagnosis not present

## 2023-02-11 DIAGNOSIS — M9902 Segmental and somatic dysfunction of thoracic region: Secondary | ICD-10-CM | POA: Diagnosis not present

## 2023-02-11 DIAGNOSIS — M9905 Segmental and somatic dysfunction of pelvic region: Secondary | ICD-10-CM | POA: Diagnosis not present

## 2023-02-11 DIAGNOSIS — S29012A Strain of muscle and tendon of back wall of thorax, initial encounter: Secondary | ICD-10-CM | POA: Diagnosis not present

## 2023-02-11 MED ORDER — KLONOPIN 1 MG PO TABS: ORAL_TABLET | ORAL | 2 refills | Status: DC

## 2023-02-12 DIAGNOSIS — R2689 Other abnormalities of gait and mobility: Secondary | ICD-10-CM | POA: Diagnosis not present

## 2023-02-12 DIAGNOSIS — M542 Cervicalgia: Secondary | ICD-10-CM | POA: Diagnosis not present

## 2023-02-14 DIAGNOSIS — M4802 Spinal stenosis, cervical region: Secondary | ICD-10-CM | POA: Diagnosis not present

## 2023-02-14 DIAGNOSIS — M9902 Segmental and somatic dysfunction of thoracic region: Secondary | ICD-10-CM | POA: Diagnosis not present

## 2023-02-14 DIAGNOSIS — S29012A Strain of muscle and tendon of back wall of thorax, initial encounter: Secondary | ICD-10-CM | POA: Diagnosis not present

## 2023-02-14 DIAGNOSIS — M5136 Other intervertebral disc degeneration, lumbar region: Secondary | ICD-10-CM | POA: Diagnosis not present

## 2023-02-14 DIAGNOSIS — M9901 Segmental and somatic dysfunction of cervical region: Secondary | ICD-10-CM | POA: Diagnosis not present

## 2023-02-14 DIAGNOSIS — M9905 Segmental and somatic dysfunction of pelvic region: Secondary | ICD-10-CM | POA: Diagnosis not present

## 2023-02-19 DIAGNOSIS — R2689 Other abnormalities of gait and mobility: Secondary | ICD-10-CM | POA: Diagnosis not present

## 2023-02-19 DIAGNOSIS — M542 Cervicalgia: Secondary | ICD-10-CM | POA: Diagnosis not present

## 2023-02-20 ENCOUNTER — Other Ambulatory Visit: Payer: Self-pay | Admitting: Family Medicine

## 2023-02-20 DIAGNOSIS — M5136 Other intervertebral disc degeneration, lumbar region: Secondary | ICD-10-CM | POA: Diagnosis not present

## 2023-02-20 DIAGNOSIS — M9902 Segmental and somatic dysfunction of thoracic region: Secondary | ICD-10-CM | POA: Diagnosis not present

## 2023-02-20 DIAGNOSIS — M4802 Spinal stenosis, cervical region: Secondary | ICD-10-CM | POA: Diagnosis not present

## 2023-02-20 DIAGNOSIS — S29012A Strain of muscle and tendon of back wall of thorax, initial encounter: Secondary | ICD-10-CM | POA: Diagnosis not present

## 2023-02-20 DIAGNOSIS — E2839 Other primary ovarian failure: Secondary | ICD-10-CM

## 2023-02-20 DIAGNOSIS — M9901 Segmental and somatic dysfunction of cervical region: Secondary | ICD-10-CM | POA: Diagnosis not present

## 2023-02-20 DIAGNOSIS — M9905 Segmental and somatic dysfunction of pelvic region: Secondary | ICD-10-CM | POA: Diagnosis not present

## 2023-02-21 ENCOUNTER — Other Ambulatory Visit: Payer: Medicare Other

## 2023-02-22 DIAGNOSIS — R2689 Other abnormalities of gait and mobility: Secondary | ICD-10-CM | POA: Diagnosis not present

## 2023-02-22 DIAGNOSIS — M542 Cervicalgia: Secondary | ICD-10-CM | POA: Diagnosis not present

## 2023-02-26 DIAGNOSIS — M4802 Spinal stenosis, cervical region: Secondary | ICD-10-CM | POA: Diagnosis not present

## 2023-02-26 DIAGNOSIS — M9901 Segmental and somatic dysfunction of cervical region: Secondary | ICD-10-CM | POA: Diagnosis not present

## 2023-02-26 DIAGNOSIS — S29012A Strain of muscle and tendon of back wall of thorax, initial encounter: Secondary | ICD-10-CM | POA: Diagnosis not present

## 2023-02-26 DIAGNOSIS — M9905 Segmental and somatic dysfunction of pelvic region: Secondary | ICD-10-CM | POA: Diagnosis not present

## 2023-02-26 DIAGNOSIS — H04123 Dry eye syndrome of bilateral lacrimal glands: Secondary | ICD-10-CM | POA: Diagnosis not present

## 2023-02-26 DIAGNOSIS — M5136 Other intervertebral disc degeneration, lumbar region: Secondary | ICD-10-CM | POA: Diagnosis not present

## 2023-02-26 DIAGNOSIS — M9902 Segmental and somatic dysfunction of thoracic region: Secondary | ICD-10-CM | POA: Diagnosis not present

## 2023-02-27 DIAGNOSIS — M542 Cervicalgia: Secondary | ICD-10-CM | POA: Diagnosis not present

## 2023-02-27 DIAGNOSIS — R2689 Other abnormalities of gait and mobility: Secondary | ICD-10-CM | POA: Diagnosis not present

## 2023-03-06 ENCOUNTER — Ambulatory Visit (INDEPENDENT_AMBULATORY_CARE_PROVIDER_SITE_OTHER): Payer: Medicare Other | Admitting: Adult Health

## 2023-03-06 ENCOUNTER — Encounter: Payer: Self-pay | Admitting: Adult Health

## 2023-03-06 DIAGNOSIS — F411 Generalized anxiety disorder: Secondary | ICD-10-CM | POA: Diagnosis not present

## 2023-03-06 DIAGNOSIS — F331 Major depressive disorder, recurrent, moderate: Secondary | ICD-10-CM

## 2023-03-06 DIAGNOSIS — G4700 Insomnia, unspecified: Secondary | ICD-10-CM

## 2023-03-06 DIAGNOSIS — G4730 Sleep apnea, unspecified: Secondary | ICD-10-CM

## 2023-03-06 MED ORDER — REXULTI 0.5 MG PO TABS: 0.5000 mg | ORAL_TABLET | Freq: Every day | ORAL | 0 refills | Status: DC

## 2023-03-06 NOTE — Progress Notes (Signed)
Olivia [name] [id number] 03-Sep-1951 72 y.o.  Subjective:   Patient ID:  Olivia [name] is a 72 y.o. (DOB 1951-06-07) female.  Chief Complaint: No chief complaint on file.   HPI Olivia [name] presents to the office today for follow-up of depression, anxiety, sleep apnea, and insomnia.  Describes mood today as "not good". Stating "life is so overwhelming for me". Pleasant. Denies tearfulness. Mood symptoms - reports increased depression and anxiety. Reports some irritability. Denies worry, rumination, and over thinking. Feels overwhelmed. Reports ongoing situational stressors. Mood is lower. Stating "I'm just not feeling good". Uncertain if medications are helping with current mood symptoms. Working with pain management clinic. Decreased interest and motivation. Taking medications as prescribed. Energy levels lower. Active, does not have a regular exercise routine. Enjoys some usual interests and activities. Single. Lives with son. Talking to with friends.  Appetite adequate. Weight stable. Sleeps better some nights than others. Averages 9 hours. Using CPAP - has new machine. Denies daytime napping. Focus and concentration difficulties. Completing tasks. Managing some aspects of household. Retired. Denies SI or HI.  Denies AH or VH.  Denies self harm. Denies substance use.   Previous medications: Wellbutrin - bad reaction   Review of Systems:  Review of Systems  Musculoskeletal:  Negative for gait problem.  Neurological:  Negative for tremors.  Psychiatric/Behavioral:         Please refer to HPI    Medications: I have reviewed the patient's current medications.  Current Outpatient Medications  Medication Sig Dispense Refill   acetaminophen (TYLENOL) 500 MG tablet Take 2 tablets (1,000 mg total) by mouth every 6 (six) hours as needed for mild pain or moderate pain. 30 tablet 0   amLODipine (NORVASC) 5 MG tablet TAKE 1 TABLET(5 MG) BY MOUTH DAILY 30 tablet 5   Armodafinil 250 MG tablet  TAKE 1 TABLET(250 MG) BY MOUTH DAILY 30 tablet 2   aspirin EC 325 MG EC tablet Take 1 tablet (325 mg total) by mouth 2 (two) times daily. (Patient not taking: Reported on 01/04/2021) 30 tablet 0   atenolol (TENORMIN) 25 MG tablet Take 25 mg by mouth at bedtime.      azelastine (ASTELIN) 0.1 % nasal spray Place 2 sprays into both nostrils daily as needed for rhinitis. Use in each nostril as directed 30 mL 3   azithromycin (ZITHROMAX) 250 MG tablet Take 2 tablets today then 1 tablet daily until gone 6 tablet 0   benzonatate (TESSALON) 100 MG capsule TAKE 1 CAPSULE(100 MG) BY MOUTH TWICE DAILY AS NEEDED FOR COUGH 60 capsule 2   busPIRone (BUSPAR) 10 MG tablet Take 1/2 tablet twice daily x 7 days, then increase to one tablet twice daily with food. 60 tablet 2   CALCIUM-MAGNESIUM-VITAMIN D PO Take 1 tablet by mouth daily.     cetirizine (ZYRTEC) 10 MG tablet TAKE 1 TABLET(10 MG) BY MOUTH DAILY 90 tablet 0   esomeprazole (NEXIUM) 40 MG capsule Take 40 mg by mouth 2 (two) times daily before a meal.      FLUoxetine (PROZAC) 20 MG capsule Take 1 capsule (20 mg total) by mouth daily. 30 capsule 5   FLUoxetine (PROZAC) 40 MG capsule TAKE 1 CAPSULE(40 MG) BY MOUTH DAILY 30 capsule 5   fluticasone (FLONASE) 50 MCG/ACT nasal spray SHAKE LIQUID AND USE 1 SPRAY IN EACH NOSTRIL DAILY 16 g 8   hydrOXYzine (ATARAX) 25 MG tablet Take 1 tablet (25 mg total) by mouth 3 (three) times daily as needed.  90 tablet 0   KLONOPIN 1 MG tablet TAKE 1 TABLET(1 MG) BY MOUTH THREE TIMES DAILY AS NEEDED FOR ANXIETY 90 tablet 2   lisinopril (PRINIVIL,ZESTRIL) 20 MG tablet Take 20 mg by mouth daily.  (Patient not taking: Reported on 01/04/2021)  1   methocarbamol (ROBAXIN) 500 MG tablet Take 1-2 tablets (500-1,000 mg total) by mouth every 6 (six) hours as needed for muscle spasms. 60 tablet 0   Multiple Vitamin (MULITIVITAMIN WITH MINERALS) TABS Take 1 tablet by mouth daily.     predniSONE (DELTASONE) 20 MG tablet Take 40 mg daily x 5 days  10 tablet 0   sodium chloride (OCEAN) 0.65 % SOLN nasal spray Place 2 sprays into both nostrils as needed for congestion. 30 mL 2   SYNTHROID 125 MCG tablet Take 125 mcg by mouth at bedtime.      vitamin C (ASCORBIC ACID) 500 MG tablet Take 500 mg by mouth 2 (two) times a week.      No current facility-administered medications for this visit.    Medication Side Effects: None  Allergies:  Allergies  Allergen Reactions   Demerol [Meperidine] Nausea And Vomiting   Other     Other reaction(s): hives   Penicillins Other (See Comments)    Unknown childhood allergy Has patient had a PCN reaction causing immediate rash, facial/tongue/throat swelling, SOB or lightheadedness with hypotension: Unknown Has patient had a PCN reaction causing severe rash involving mucus membranes or skin necrosis: Unknown Has patient had a PCN reaction that required hospitalization: Unknown Has patient had a PCN reaction occurring within the last 10 years: No If all of the above answers are "NO", then may proceed with Cephalosporin use.     Past Medical History:  Diagnosis Date   Anxiety    [facility]   Arthritis    Cataract    THESE BEEN REPLACED   Depression    [facility]   Diverticulosis    Dry eye syndrome    GERD (gastroesophageal reflux disease)    History of colon polyps 12/03/2012   COLONOSCOPY   History of exercise stress test 2010   Dr. Kym Groom    Hyperlipidemia    Hypertension    Hypothyroidism    Multinodular goiter    DR. KERR   OSA (obstructive sleep apnea)    (PSG 12/12/15 ESS 2, AHI 42/HR REM 30/HR, O2 MIN 80%)  CPAP- q night , done with [facility].  study done at [facility]   Squamous cell carcinoma in situ 2015   Thyroid disease    Nodules   Tubular adenoma    DR. SCHOOLER   Varicose veins     Past Medical History, Surgical history, Social history, and Family history were reviewed and updated as appropriate.   Please see review of systems for  further details on the patient's review from today.   Objective:   Physical Exam:  There were no vitals taken for this visit.  Physical Exam Constitutional:      General: She is not in acute distress. Musculoskeletal:        General: No deformity.  Neurological:     Mental Status: She is alert and oriented to person, place, and time.     Coordination: Coordination normal.  Psychiatric:        Attention and Perception: Attention and perception normal. She does not perceive auditory or visual hallucinations.        Mood and Affect: Mood normal. Mood is not  anxious or depressed. Affect is not labile, blunt, angry or inappropriate.        Speech: Speech normal.        Behavior: Behavior normal.        Thought Content: Thought content normal. Thought content is not paranoid or delusional. Thought content does not include homicidal or suicidal ideation. Thought content does not include homicidal or suicidal plan.        Cognition and Memory: Cognition and memory normal.        Judgment: Judgment normal.     Comments: Insight intact     Lab Review:     Component Value Date/Time   NA 130 (L) 12/01/2019 0321   K 4.2 12/01/2019 0321   CL 98 12/01/2019 0321   CO2 23 12/01/2019 0321   GLUCOSE 94 12/01/2019 0321   BUN 14 12/01/2019 0321   CREATININE 0.84 12/01/2019 0321   CALCIUM 8.3 (L) 12/01/2019 0321   PROT 7.4 11/24/2019 1450   ALBUMIN 4.0 11/24/2019 1450   AST 17 11/24/2019 1450   ALT 18 11/24/2019 1450   ALKPHOS 82 11/24/2019 1450   BILITOT 0.4 11/24/2019 1450   GFRNONAA >60 12/01/2019 0321   GFRAA >60 12/01/2019 0321       Component Value Date/Time   WBC 7.4 12/01/2019 0321   RBC 3.29 (L) 12/01/2019 0321   HGB 10.0 (L) 12/01/2019 0321   HCT 31.0 (L) 12/01/2019 0321   PLT 244 12/01/2019 0321   MCV 94.2 12/01/2019 0321   MCH 30.4 12/01/2019 0321   MCHC 32.3 12/01/2019 0321   RDW 15.8 (H) 12/01/2019 0321   LYMPHSABS 1.9 11/24/2019 1450   MONOABS 0.6 11/24/2019 1450    EOSABS 0.2 11/24/2019 1450   BASOSABS 0.0 11/24/2019 1450    No results found for: "POCLITH", "LITHIUM"   No results found for: "PHENYTOIN", "PHENOBARB", "VALPROATE", "CBMZ"   .res Assessment: Plan:    Plan:  Prozac 40mg  daily - reduced from 60mg  daily  Clonazepam 1mg  TID Nuvigil 250mg  tablet every morning for sleep apnea Hydroxyzine 25mg  TID prn anxiety - taking once a day  Add Rexulti 0.5mg  daily - 1/2 tablet for three days, then increase to one tablet.  RTC 3 months  Patient advised to contact office with any questions, adverse effects, or acute worsening in signs and symptoms.  Discussed potential benefits, risk, and side effects of benzodiazepines to include potential risk of tolerance and dependence, as well as possible drowsiness.  Advised patient not to drive if experiencing drowsiness and to take lowest possible effective dose to minimize risk of dependence and tolerance.  There are no diagnoses linked to this encounter.   Please see After Visit Summary for patient specific instructions.  Future Appointments  Date Time Provider Department Center  03/06/2023  2:00 PM Shyke[facility] Resurreccion, Thereasa Solo, NP [facility] None  09/12/2023  1:30 PM [facility] DX DEXA 1 [facility]    No orders of the defined types were placed in this encounter.   -------------------------------

## 2023-03-07 DIAGNOSIS — R2689 Other abnormalities of gait and mobility: Secondary | ICD-10-CM | POA: Diagnosis not present

## 2023-03-07 DIAGNOSIS — M542 Cervicalgia: Secondary | ICD-10-CM | POA: Diagnosis not present

## 2023-03-11 DIAGNOSIS — R2689 Other abnormalities of gait and mobility: Secondary | ICD-10-CM | POA: Diagnosis not present

## 2023-03-11 DIAGNOSIS — M542 Cervicalgia: Secondary | ICD-10-CM | POA: Diagnosis not present

## 2023-03-12 DIAGNOSIS — R2689 Other abnormalities of gait and mobility: Secondary | ICD-10-CM | POA: Diagnosis not present

## 2023-03-12 DIAGNOSIS — M542 Cervicalgia: Secondary | ICD-10-CM | POA: Diagnosis not present

## 2023-03-13 DIAGNOSIS — M9905 Segmental and somatic dysfunction of pelvic region: Secondary | ICD-10-CM | POA: Diagnosis not present

## 2023-03-13 DIAGNOSIS — S29012A Strain of muscle and tendon of back wall of thorax, initial encounter: Secondary | ICD-10-CM | POA: Diagnosis not present

## 2023-03-13 DIAGNOSIS — M9901 Segmental and somatic dysfunction of cervical region: Secondary | ICD-10-CM | POA: Diagnosis not present

## 2023-03-13 DIAGNOSIS — M4802 Spinal stenosis, cervical region: Secondary | ICD-10-CM | POA: Diagnosis not present

## 2023-03-13 DIAGNOSIS — M5136 Other intervertebral disc degeneration, lumbar region: Secondary | ICD-10-CM | POA: Diagnosis not present

## 2023-03-13 DIAGNOSIS — M9902 Segmental and somatic dysfunction of thoracic region: Secondary | ICD-10-CM | POA: Diagnosis not present

## 2023-03-14 ENCOUNTER — Other Ambulatory Visit: Payer: Self-pay

## 2023-03-14 MED ORDER — HYDROXYZINE HCL 25 MG PO TABS: 25.0000 mg | ORAL_TABLET | Freq: Three times a day (TID) | ORAL | 0 refills | Status: DC | PRN

## 2023-03-18 DIAGNOSIS — M542 Cervicalgia: Secondary | ICD-10-CM | POA: Diagnosis not present

## 2023-03-18 DIAGNOSIS — R2689 Other abnormalities of gait and mobility: Secondary | ICD-10-CM | POA: Diagnosis not present

## 2023-03-18 DIAGNOSIS — Z79899 Other long term (current) drug therapy: Secondary | ICD-10-CM | POA: Diagnosis not present

## 2023-03-19 DIAGNOSIS — L700 Acne vulgaris: Secondary | ICD-10-CM | POA: Diagnosis not present

## 2023-03-19 DIAGNOSIS — L720 Epidermal cyst: Secondary | ICD-10-CM | POA: Diagnosis not present

## 2023-03-19 DIAGNOSIS — L820 Inflamed seborrheic keratosis: Secondary | ICD-10-CM | POA: Diagnosis not present

## 2023-03-22 DIAGNOSIS — R2689 Other abnormalities of gait and mobility: Secondary | ICD-10-CM | POA: Diagnosis not present

## 2023-03-22 DIAGNOSIS — M542 Cervicalgia: Secondary | ICD-10-CM | POA: Diagnosis not present

## 2023-03-26 DIAGNOSIS — R2689 Other abnormalities of gait and mobility: Secondary | ICD-10-CM | POA: Diagnosis not present

## 2023-03-26 DIAGNOSIS — M542 Cervicalgia: Secondary | ICD-10-CM | POA: Diagnosis not present

## 2023-03-27 ENCOUNTER — Ambulatory Visit: Payer: Medicare Other | Admitting: Adult Health

## 2023-04-01 ENCOUNTER — Telehealth: Payer: Self-pay | Admitting: Adult Health

## 2023-04-01 ENCOUNTER — Other Ambulatory Visit: Payer: Self-pay

## 2023-04-01 DIAGNOSIS — G4730 Sleep apnea, unspecified: Secondary | ICD-10-CM

## 2023-04-01 MED ORDER — ARMODAFINIL 250 MG PO TABS: ORAL_TABLET | ORAL | 0 refills | Status: DC

## 2023-04-01 NOTE — Telephone Encounter (Signed)
Pt lvm that she needs a refill on her armodafinil 250 mg. Pharmacy is [facility] on [address]. Next appt 7/3

## 2023-04-01 NOTE — Telephone Encounter (Signed)
Pended.

## 2023-04-02 ENCOUNTER — Telehealth: Payer: Self-pay

## 2023-04-02 ENCOUNTER — Telehealth: Payer: Self-pay | Admitting: Adult Health

## 2023-04-02 DIAGNOSIS — M542 Cervicalgia: Secondary | ICD-10-CM | POA: Diagnosis not present

## 2023-04-02 DIAGNOSIS — R2689 Other abnormalities of gait and mobility: Secondary | ICD-10-CM | POA: Diagnosis not present

## 2023-04-02 NOTE — Telephone Encounter (Signed)
PA approved Armodafinil #30/30 [employer]  This approval is for 03/19/2023 until further notice. This drug has been approved under the Member's Medicare Part D benefit. Approved  quantity: 30 units per 30 day(s). You may fill up to a 90 day supply except for those on  Specialty Tier 5, which can be filled up to a 30 day supply.

## 2023-04-02 NOTE — Telephone Encounter (Signed)
[employer] approved ARMODAFINIL 250mg  tablets from 03/19/23 until further notice.

## 2023-04-03 ENCOUNTER — Ambulatory Visit (INDEPENDENT_AMBULATORY_CARE_PROVIDER_SITE_OTHER): Payer: Medicare Other | Admitting: Adult Health

## 2023-04-03 ENCOUNTER — Encounter: Payer: Self-pay | Admitting: Adult Health

## 2023-04-03 DIAGNOSIS — M9902 Segmental and somatic dysfunction of thoracic region: Secondary | ICD-10-CM | POA: Diagnosis not present

## 2023-04-03 DIAGNOSIS — M4802 Spinal stenosis, cervical region: Secondary | ICD-10-CM | POA: Diagnosis not present

## 2023-04-03 DIAGNOSIS — M9901 Segmental and somatic dysfunction of cervical region: Secondary | ICD-10-CM | POA: Diagnosis not present

## 2023-04-03 DIAGNOSIS — F331 Major depressive disorder, recurrent, moderate: Secondary | ICD-10-CM | POA: Diagnosis not present

## 2023-04-03 DIAGNOSIS — G4700 Insomnia, unspecified: Secondary | ICD-10-CM

## 2023-04-03 DIAGNOSIS — M5136 Other intervertebral disc degeneration, lumbar region: Secondary | ICD-10-CM | POA: Diagnosis not present

## 2023-04-03 DIAGNOSIS — S29012A Strain of muscle and tendon of back wall of thorax, initial encounter: Secondary | ICD-10-CM | POA: Diagnosis not present

## 2023-04-03 DIAGNOSIS — M9905 Segmental and somatic dysfunction of pelvic region: Secondary | ICD-10-CM | POA: Diagnosis not present

## 2023-04-03 DIAGNOSIS — G4730 Sleep apnea, unspecified: Secondary | ICD-10-CM

## 2023-04-03 DIAGNOSIS — F411 Generalized anxiety disorder: Secondary | ICD-10-CM

## 2023-04-03 NOTE — Progress Notes (Signed)
Olivia [name] [id number] [birth year] 72 y.o.  Subjective:   Patient ID:  Olivia [name] is a 72 y.o. (DOB [birth year]) female.  Chief Complaint: No chief complaint on file.   HPI Olivia [name] presents to the office today for follow-up of depression, anxiety, sleep apnea, and insomnia.  Describes mood today as "about the same". Pleasant. Denies tearfulness. Mood symptoms - reports  decreased depression. Reports increased anxiety. Reports some irritability. Denies worry, rumination, and over thinking. Feels overwhelmed. Reports ongoing situational stressors. Mood is pretty consistent - "gets frustrated" Stating "I don't feel like I'm doing bad". Does not feel like addition of Rexulti has been helpful - willing to consider other options. Working with pain management clinic. Varying interest and motivation. Taking medications as prescribed. Energy levels lower. Active, does not have a regular exercise routine. Enjoys some usual interests and activities. Single. Lives with son. Talking to with friends.  Appetite adequate. Weight stable. Sleeps better some nights than others. Averages 7.5 hours over the past week. Using CPAP - has new machine. Denies daytime napping. Focus and concentration difficulties. Completing tasks. Managing some aspects of household. Retired. Denies SI or HI.  Denies AH or VH.  Denies self harm. Denies substance use.   Previous medications: Wellbutrin - bad reaction   Review of Systems:  Review of Systems  Musculoskeletal:  Negative for gait problem.  Neurological:  Negative for tremors.  Psychiatric/Behavioral:         Please refer to HPI    Medications: I have reviewed the patient's current medications.  Current Outpatient Medications  Medication Sig Dispense Refill   acetaminophen (TYLENOL) 500 MG tablet Take 2 tablets (1,000 mg total) by mouth every 6 (six) hours as needed for mild pain or moderate pain. 30 tablet 0   amLODipine (NORVASC) 5 MG tablet TAKE 1  TABLET(5 MG) BY MOUTH DAILY 30 tablet 5   Armodafinil 250 MG tablet TAKE 1 TABLET(250 MG) BY MOUTH DAILY 30 tablet 0   aspirin EC 325 MG EC tablet Take 1 tablet (325 mg total) by mouth 2 (two) times daily. (Patient not taking: Reported on 01/04/2021) 30 tablet 0   atenolol (TENORMIN) 25 MG tablet Take 25 mg by mouth at bedtime.      azelastine (ASTELIN) 0.1 % nasal spray Place 2 sprays into both nostrils daily as needed for rhinitis. Use in each nostril as directed 30 mL 3   azithromycin (ZITHROMAX) 250 MG tablet Take 2 tablets today then 1 tablet daily until gone 6 tablet 0   benzonatate (TESSALON) 100 MG capsule TAKE 1 CAPSULE(100 MG) BY MOUTH TWICE DAILY AS NEEDED FOR COUGH 60 capsule 2   Brexpiprazole (REXULTI) 0.5 MG TABS Take 1 tablet (0.5 mg total) by mouth daily. 21 tablet 0   busPIRone (BUSPAR) 10 MG tablet Take 1/2 tablet twice daily x 7 days, then increase to one tablet twice daily with food. 60 tablet 2   CALCIUM-MAGNESIUM-VITAMIN D PO Take 1 tablet by mouth daily.     cetirizine (ZYRTEC) 10 MG tablet TAKE 1 TABLET(10 MG) BY MOUTH DAILY 90 tablet 0   esomeprazole (NEXIUM) 40 MG capsule Take 40 mg by mouth 2 (two) times daily before a meal.      FLUoxetine (PROZAC) 20 MG capsule Take 1 capsule (20 mg total) by mouth daily. 30 capsule 5   FLUoxetine (PROZAC) 40 MG capsule TAKE 1 CAPSULE(40 MG) BY MOUTH DAILY 30 capsule 5   fluticasone (FLONASE) 50 MCG/ACT nasal spray SHAKE LIQUID  AND USE 1 SPRAY IN EACH NOSTRIL DAILY 16 g 8   hydrOXYzine (ATARAX) 25 MG tablet Take 1 tablet (25 mg total) by mouth 3 (three) times daily as needed. 90 tablet 0   KLONOPIN 1 MG tablet TAKE 1 TABLET(1 MG) BY MOUTH THREE TIMES DAILY AS NEEDED FOR ANXIETY 90 tablet 2   lisinopril (PRINIVIL,ZESTRIL) 20 MG tablet Take 20 mg by mouth daily.  (Patient not taking: Reported on 01/04/2021)  1   methocarbamol (ROBAXIN) 500 MG tablet Take 1-2 tablets (500-1,000 mg total) by mouth every 6 (six) hours as needed for muscle  spasms. 60 tablet 0   Multiple Vitamin (MULITIVITAMIN WITH MINERALS) TABS Take 1 tablet by mouth daily.     predniSONE (DELTASONE) 20 MG tablet Take 40 mg daily x 5 days 10 tablet 0   sodium chloride (OCEAN) 0.65 % SOLN nasal spray Place 2 sprays into both nostrils as needed for congestion. 30 mL 2   SYNTHROID 125 MCG tablet Take 125 mcg by mouth at bedtime.      vitamin C (ASCORBIC ACID) 500 MG tablet Take 500 mg by mouth 2 (two) times a week.      No current facility-administered medications for this visit.    Medication Side Effects: None  Allergies:  Allergies  Allergen Reactions   Demerol [Meperidine] Nausea And Vomiting   Other     Other reaction(s): hives   Penicillins Other (See Comments)    Unknown childhood allergy  Has patient had a PCN reaction causing immediate rash, facial/tongue/throat swelling, SOB or lightheadedness with hypotension: Unknown  Has patient had a PCN reaction causing severe rash involving mucus membranes or skin necrosis: Unknown  Has patient had a PCN reaction that required hospitalization: Unknown  Has patient had a PCN reaction occurring within the last 10 years: No  If all of the above answers are "NO", then may proceed with Cephalosporin use.  Other Reaction(s): Childhood Allergy, Other (See Comments)  Unknown childhood allergy, Has patient had a PCN reaction causing immediate rash, facial/tongue/throat swelling, SOB or lightheadedness with hypotension: Unknown, Has patient had a PCN reaction causing severe rash involving mucus membranes or skin necrosis: Unknown, Has patient had a PCN reaction that required hospitalization: Unknown, Has patient had a PCN reaction occurring within the last 10 years: No, If all of the above answers are "NO", then may proceed with Cephalosporin use.    Past Medical History:  Diagnosis Date   Anxiety    [facility]   Arthritis    Cataract    THESE BEEN REPLACED   Depression    [facility]   Diverticulosis    Dry eye syndrome    GERD (gastroesophageal reflux disease)    History of colon polyps 12/03/2012   COLONOSCOPY   History of exercise stress test 2010   Dr. Kym Groom    Hyperlipidemia    Hypertension    Hypothyroidism    Multinodular goiter    DR. KERR   OSA (obstructive sleep apnea)    (PSG 12/12/15 ESS 2, AHI 42/HR REM 30/HR, O2 MIN 80%)  CPAP- q night , done with [facility]  study done at [facility]   Squamous cell carcinoma in situ 2015   Thyroid disease    Nodules   Tubular adenoma    DR. SCHOOLER   Varicose veins     Past Medical History, Surgical history, Social history, and Family history were reviewed and updated as appropriate.   Please see review  of systems for further details on the patient's review from today.   Objective:   Physical Exam:  There were no vitals taken for this visit.  Physical Exam Constitutional:      General: She is not in acute distress. Musculoskeletal:        General: No deformity.  Neurological:     Mental Status: She is alert and oriented to person, place, and time.     Coordination: Coordination normal.  Psychiatric:        Attention and Perception: Attention and perception normal. She does not perceive auditory or visual hallucinations.        Mood and Affect: Affect is not labile, blunt, angry or inappropriate.        Speech: Speech normal.        Behavior: Behavior normal.        Thought Content: Thought content normal. Thought content is not paranoid or delusional. Thought content does not include homicidal or suicidal ideation. Thought content does not include homicidal or suicidal plan.        Cognition and Memory: Cognition and memory normal.        Judgment: Judgment normal.     Comments: Insight intact     Lab Review:     Component Value Date/Time   NA 130 (L) 12/01/2019 0321   K 4.2 12/01/2019 0321   CL 98 12/01/2019 0321   CO2 23 12/01/2019 0321   GLUCOSE 94 12/01/2019 0321    BUN 14 12/01/2019 0321   CREATININE 0.84 12/01/2019 0321   CALCIUM 8.3 (L) 12/01/2019 0321   PROT 7.4 11/24/2019 1450   ALBUMIN 4.0 11/24/2019 1450   AST 17 11/24/2019 1450   ALT 18 11/24/2019 1450   ALKPHOS 82 11/24/2019 1450   BILITOT 0.4 11/24/2019 1450   GFRNONAA >60 12/01/2019 0321   GFRAA >60 12/01/2019 0321       Component Value Date/Time   WBC 7.4 12/01/2019 0321   RBC 3.29 (L) 12/01/2019 0321   HGB 10.0 (L) 12/01/2019 0321   HCT 31.0 (L) 12/01/2019 0321   PLT 244 12/01/2019 0321   MCV 94.2 12/01/2019 0321   MCH 30.4 12/01/2019 0321   MCHC 32.3 12/01/2019 0321   RDW 15.8 (H) 12/01/2019 0321   LYMPHSABS 1.9 11/24/2019 1450   MONOABS 0.6 11/24/2019 1450   EOSABS 0.2 11/24/2019 1450   BASOSABS 0.0 11/24/2019 1450    No results found for: "POCLITH", "LITHIUM"   No results found for: "PHENYTOIN", "PHENOBARB", "VALPROATE", "CBMZ"   .res Assessment: Plan:    Plan:  Increase Prozac 40mg  daily back to 60mg  daily   Clonazepam 1mg  TID Nuvigil 250mg  tablet every morning for sleep apnea Hydroxyzine 25mg  TID prn anxiety - taking once a day  D/C Rexulti 0.5mg  daily - not helpful  RTC 3 months  Patient advised to contact office with any questions, adverse effects, or acute worsening in signs and symptoms.  Discussed potential benefits, risk, and side effects of benzodiazepines to include potential risk of tolerance and dependence, as well as possible drowsiness.  Advised patient not to drive if experiencing drowsiness and to take lowest possible effective dose to minimize risk of dependence and tolerance.  Diagnoses and all orders for this visit:  Major depressive disorder, recurrent episode, moderate (HCC)  Generalized anxiety disorder  Insomnia, unspecified type  Sleep apnea, unspecified type     Please see After Visit Summary for patient specific instructions.  Future Appointments  Date Time Provider Department Center  09/12/2023  1:30 PM [facility] DX DEXA  1 [facility]    No orders of the defined types were placed in this encounter.   -------------------------------

## 2023-04-08 DIAGNOSIS — E039 Hypothyroidism, unspecified: Secondary | ICD-10-CM | POA: Diagnosis not present

## 2023-04-08 DIAGNOSIS — E871 Hypo-osmolality and hyponatremia: Secondary | ICD-10-CM | POA: Diagnosis not present

## 2023-04-09 DIAGNOSIS — R2689 Other abnormalities of gait and mobility: Secondary | ICD-10-CM | POA: Diagnosis not present

## 2023-04-09 DIAGNOSIS — M542 Cervicalgia: Secondary | ICD-10-CM | POA: Diagnosis not present

## 2023-04-10 ENCOUNTER — Telehealth: Payer: Self-pay | Admitting: Adult Health

## 2023-04-10 DIAGNOSIS — F411 Generalized anxiety disorder: Secondary | ICD-10-CM

## 2023-04-10 MED ORDER — FLUOXETINE HCL 20 MG PO CAPS: 20.0000 mg | ORAL_CAPSULE | Freq: Every day | ORAL | 0 refills | Status: DC

## 2023-04-10 NOTE — Telephone Encounter (Signed)
Patient notified. Patient has 40 mg capsules, will need a 7-day supply of 20 to complete the wean. Will send in new Rx.

## 2023-04-10 NOTE — Telephone Encounter (Signed)
Need to review labs. She can decrease by 20mg  weekly.

## 2023-04-10 NOTE — Telephone Encounter (Signed)
LVM to [facility] 

## 2023-04-10 NOTE — Telephone Encounter (Signed)
Patient currently on 60 mg fluoxetine. Endocrinologist wants her to stop it due to hyponatremia. She saw provider yesterday but I don't see any lab results to know what the value was.  How to wean and ? What she can take in place of. Was seen 7/3 and F/U scheduled for 10/3.

## 2023-04-10 NOTE — Telephone Encounter (Signed)
Ok. Lets get her tapered off and look at other options.

## 2023-04-10 NOTE — Telephone Encounter (Signed)
From 7/9:    Notes/Report:  Glucose 109 70-99 mg/dL    BUN 16 1-61 mg/dL    Creatinine 0.96 0.45-4.09 mg/dL    eGFR 96 >81 XB/JYN/8.29    BUN/Creatinine Ratio 26 12-28    Sodium 127 134-144 mmol/L    Potassium 4.5 3.5-5.2 mmol/L    Chloride 90 96-106 mmol/L    Carbon Dioxide, Total 18 20-29 mmol/L    Calcium 9.5 8.7-10.3 mg/dL    Osmolality, Serum Reviewed date:04/09/2023 06:51:17 PM Interpretation: Performing FAO:[facility], [address], [place], [place] [id number], Phone - [phone], Director - MDNagendra Notes/Report:  Osmolality 262 280-301 mOsmol/kg    TSH Reviewed date:04/09/2023 08:51:13 AM Interpretation: Performing UVO:[facility], [address] [place], [place] [id number], Phone - [phone], Director - MDNagendra Notes/Report:  TSH 2.300 0.450-4.500 uIU/mL

## 2023-04-10 NOTE — Telephone Encounter (Signed)
Pt called and said that her endcronologist wants her to stop the prozac. She knows she can't stop it cold [place]. She needs help with what to do. She needs to be on something different. Plewase call her at [phone]

## 2023-04-11 DIAGNOSIS — R2689 Other abnormalities of gait and mobility: Secondary | ICD-10-CM | POA: Diagnosis not present

## 2023-04-11 DIAGNOSIS — M542 Cervicalgia: Secondary | ICD-10-CM | POA: Diagnosis not present

## 2023-04-18 ENCOUNTER — Telehealth: Payer: Self-pay | Admitting: Adult Health

## 2023-04-18 NOTE — Telephone Encounter (Signed)
See message from [facility] regarding her brand Klonopin.

## 2023-04-18 NOTE — Telephone Encounter (Signed)
Lisa from [facility] lvm concerning a P A on the brand name Klonopin. She is inquiring if any other meds were tried and failed, and the Dx. She also stated a fax will be coming stating this as well.  Contact # [phone] Ref # [id number]

## 2023-04-18 NOTE — Telephone Encounter (Signed)
Is this new insurance she has? This PA was approved in February.

## 2023-04-19 ENCOUNTER — Telehealth: Payer: Self-pay | Admitting: Adult Health

## 2023-04-19 NOTE — Telephone Encounter (Signed)
Pharmacy sent PA Request for klonopin 1.0mg  , See [facility]

## 2023-04-22 NOTE — Telephone Encounter (Signed)
See previous phone messages  

## 2023-04-22 NOTE — Telephone Encounter (Signed)
Submitted through [facility] and response was to contact back of card for any changes. Will need to get a phone # to contact to clarify.

## 2023-04-24 ENCOUNTER — Telehealth: Payer: Self-pay | Admitting: Adult Health

## 2023-04-24 NOTE — Telephone Encounter (Signed)
[employer] called as the patients pharmacy benefits and was checking in the status of the PA for her Klonopin.  I told her we had received the request and would complete it on CMM.  She did give me a phone # that you could call to do it over the phone [phone].

## 2023-04-24 NOTE — Telephone Encounter (Signed)
I faxed the information they requested on Monday, have not seen any other response back.

## 2023-04-24 NOTE — Telephone Encounter (Signed)
If you need to make any changes to pending requests please contact the member's health plan  This is the message in [facility].

## 2023-04-25 DIAGNOSIS — M542 Cervicalgia: Secondary | ICD-10-CM | POA: Diagnosis not present

## 2023-04-25 DIAGNOSIS — R2689 Other abnormalities of gait and mobility: Secondary | ICD-10-CM | POA: Diagnosis not present

## 2023-04-25 NOTE — Telephone Encounter (Signed)
Faxed additionally information for Klonopin 1 mg for medically necessary to [facility].

## 2023-04-26 ENCOUNTER — Telehealth: Payer: Self-pay | Admitting: Adult Health

## 2023-04-26 NOTE — Telephone Encounter (Signed)
Klonopin approved.

## 2023-04-26 NOTE — Telephone Encounter (Signed)
Medicare [employer] [facility] approved KLONOPIN brand for pt from 04/14/23 to further notice

## 2023-04-26 NOTE — Telephone Encounter (Signed)
Klonopin 1 mg approved through [facility]

## 2023-04-29 DIAGNOSIS — Z09 Encounter for follow-up examination after completed treatment for conditions other than malignant neoplasm: Secondary | ICD-10-CM | POA: Diagnosis not present

## 2023-04-29 DIAGNOSIS — D125 Benign neoplasm of sigmoid colon: Secondary | ICD-10-CM | POA: Diagnosis not present

## 2023-04-29 DIAGNOSIS — D122 Benign neoplasm of ascending colon: Secondary | ICD-10-CM | POA: Diagnosis not present

## 2023-04-29 DIAGNOSIS — K648 Other hemorrhoids: Secondary | ICD-10-CM | POA: Diagnosis not present

## 2023-04-29 DIAGNOSIS — K5730 Diverticulosis of large intestine without perforation or abscess without bleeding: Secondary | ICD-10-CM | POA: Diagnosis not present

## 2023-04-29 DIAGNOSIS — Z86010 Personal history of colonic polyps: Secondary | ICD-10-CM | POA: Diagnosis not present

## 2023-04-30 ENCOUNTER — Encounter: Payer: Self-pay | Admitting: Adult Health

## 2023-04-30 ENCOUNTER — Ambulatory Visit (INDEPENDENT_AMBULATORY_CARE_PROVIDER_SITE_OTHER): Payer: Medicare Other | Admitting: Adult Health

## 2023-04-30 DIAGNOSIS — G4730 Sleep apnea, unspecified: Secondary | ICD-10-CM

## 2023-04-30 DIAGNOSIS — F411 Generalized anxiety disorder: Secondary | ICD-10-CM | POA: Diagnosis not present

## 2023-04-30 DIAGNOSIS — G4700 Insomnia, unspecified: Secondary | ICD-10-CM | POA: Diagnosis not present

## 2023-04-30 DIAGNOSIS — F331 Major depressive disorder, recurrent, moderate: Secondary | ICD-10-CM | POA: Diagnosis not present

## 2023-04-30 MED ORDER — CARIPRAZINE HCL 1.5 MG PO CAPS: 1.5000 mg | ORAL_CAPSULE | Freq: Every day | ORAL | 2 refills | Status: DC

## 2023-04-30 MED ORDER — ARMODAFINIL 250 MG PO TABS: ORAL_TABLET | ORAL | 2 refills | Status: DC

## 2023-04-30 MED ORDER — KLONOPIN 1 MG PO TABS: ORAL_TABLET | ORAL | 2 refills | Status: DC

## 2023-04-30 NOTE — Progress Notes (Signed)
Olivia [name] [id number] Apr 28, 1951 72 y.o.  Subjective:   Patient ID:  Olivia [name] is a 72 y.o. (DOB 11/24/1950) female.  Chief Complaint: No chief complaint on file.   HPI TRISH HETU presents to the office today for follow-up of depression, anxiety, sleep apnea, and insomnia.  Describes mood today as "lower". Pleasant. Denies tearfulness. Mood symptoms - reports increased depression and anxiety.  Reports some irritability. Denies worry, rumination, and over thinking. Reports ongoing situational stressors. Mood is pretty consistent. Stating "I feel like I'm doing alright". Reports she is having to taper off of the Prozac due to low sodium. Is willing to try Vraylar to help with mood symptoms. Working with pain management clinic. Varying interest and motivation. Taking medications as prescribed. Energy levels lower. Active, does not have a regular exercise routine. Enjoys some usual interests and activities. Single. Lives with son. Talking to with friends.  Appetite adequate. Weight stable. Sleeps better some nights than others. Averages 7.5 hours over the past week. Using CPAP - has new machine. Denies daytime napping. Focus and concentration difficulties. Completing tasks. Managing some aspects of household. Retired. Denies SI or HI.  Denies AH or VH.  Denies self harm. Denies substance use.   Previous medications: Wellbutrin - bad reaction, Rexulti    Review of Systems:  Review of Systems  Musculoskeletal:  Negative for gait problem.  Neurological:  Negative for tremors.  Psychiatric/Behavioral:         Please refer to HPI    Medications: I have reviewed the patient's current medications.  Current Outpatient Medications  Medication Sig Dispense Refill   acetaminophen (TYLENOL) 500 MG tablet Take 2 tablets (1,000 mg total) by mouth every 6 (six) hours as needed for mild pain or moderate pain. 30 tablet 0   amLODipine (NORVASC) 5 MG tablet TAKE 1 TABLET(5 MG) BY MOUTH DAILY 30  tablet 5   Armodafinil 250 MG tablet TAKE 1 TABLET(250 MG) BY MOUTH DAILY 30 tablet 0   aspirin EC 325 MG EC tablet Take 1 tablet (325 mg total) by mouth 2 (two) times daily. (Patient not taking: Reported on 01/04/2021) 30 tablet 0   atenolol (TENORMIN) 25 MG tablet Take 25 mg by mouth at bedtime.      azelastine (ASTELIN) 0.1 % nasal spray Place 2 sprays into both nostrils daily as needed for rhinitis. Use in each nostril as directed 30 mL 3   azithromycin (ZITHROMAX) 250 MG tablet Take 2 tablets today then 1 tablet daily until gone 6 tablet 0   benzonatate (TESSALON) 100 MG capsule TAKE 1 CAPSULE(100 MG) BY MOUTH TWICE DAILY AS NEEDED FOR COUGH 60 capsule 2   Brexpiprazole (REXULTI) 0.5 MG TABS Take 1 tablet (0.5 mg total) by mouth daily. 21 tablet 0   busPIRone (BUSPAR) 10 MG tablet Take 1/2 tablet twice daily x 7 days, then increase to one tablet twice daily with food. 60 tablet 2   CALCIUM-MAGNESIUM-VITAMIN D PO Take 1 tablet by mouth daily.     cetirizine (ZYRTEC) 10 MG tablet TAKE 1 TABLET(10 MG) BY MOUTH DAILY 90 tablet 0   esomeprazole (NEXIUM) 40 MG capsule Take 40 mg by mouth 2 (two) times daily before a meal.      FLUoxetine (PROZAC) 20 MG capsule Take 1 capsule (20 mg total) by mouth daily. 7 capsule 0   FLUoxetine (PROZAC) 40 MG capsule TAKE 1 CAPSULE(40 MG) BY MOUTH DAILY 30 capsule 5   fluticasone (FLONASE) 50 MCG/ACT nasal spray SHAKE  LIQUID AND USE 1 SPRAY IN EACH NOSTRIL DAILY 16 g 8   hydrOXYzine (ATARAX) 25 MG tablet Take 1 tablet (25 mg total) by mouth 3 (three) times daily as needed. 90 tablet 0   KLONOPIN 1 MG tablet TAKE 1 TABLET(1 MG) BY MOUTH THREE TIMES DAILY AS NEEDED FOR ANXIETY 90 tablet 2   lisinopril (PRINIVIL,ZESTRIL) 20 MG tablet Take 20 mg by mouth daily.  (Patient not taking: Reported on 01/04/2021)  1   methocarbamol (ROBAXIN) 500 MG tablet Take 1-2 tablets (500-1,000 mg total) by mouth every 6 (six) hours as needed for muscle spasms. 60 tablet 0   Multiple  Vitamin (MULITIVITAMIN WITH MINERALS) TABS Take 1 tablet by mouth daily.     predniSONE (DELTASONE) 20 MG tablet Take 40 mg daily x 5 days 10 tablet 0   sodium chloride (OCEAN) 0.65 % SOLN nasal spray Place 2 sprays into both nostrils as needed for congestion. 30 mL 2   SYNTHROID 125 MCG tablet Take 125 mcg by mouth at bedtime.      vitamin C (ASCORBIC ACID) 500 MG tablet Take 500 mg by mouth 2 (two) times a week.      No current facility-administered medications for this visit.    Medication Side Effects: None  Allergies:  Allergies  Allergen Reactions   Demerol [Meperidine] Nausea And Vomiting   Other     Other reaction(s): hives   Penicillins Other (See Comments)    Unknown childhood allergy  Has patient had a PCN reaction causing immediate rash, facial/tongue/throat swelling, SOB or lightheadedness with hypotension: Unknown  Has patient had a PCN reaction causing severe rash involving mucus membranes or skin necrosis: Unknown  Has patient had a PCN reaction that required hospitalization: Unknown  Has patient had a PCN reaction occurring within the last 10 years: No  If all of the above answers are "NO", then may proceed with Cephalosporin use.  Other Reaction(s): Childhood Allergy, Other (See Comments)  Unknown childhood allergy, Has patient had a PCN reaction causing immediate rash, facial/tongue/throat swelling, SOB or lightheadedness with hypotension: Unknown, Has patient had a PCN reaction causing severe rash involving mucus membranes or skin necrosis: Unknown, Has patient had a PCN reaction that required hospitalization: Unknown, Has patient had a PCN reaction occurring within the last 10 years: No, If all of the above answers are "NO", then may proceed with Cephalosporin use.    Past Medical History:  Diagnosis Date   Anxiety    [facility]   Arthritis    Cataract    THESE BEEN REPLACED   Depression    [facility]   Diverticulosis    Dry  eye syndrome    GERD (gastroesophageal reflux disease)    History of colon polyps 12/03/2012   COLONOSCOPY   History of exercise stress test 2010   Dr. Kym Groom    Hyperlipidemia    Hypertension    Hypothyroidism    Multinodular goiter    DR. KERR   OSA (obstructive sleep apnea)    (PSG 12/12/15 ESS 2, AHI 42/HR REM 30/HR, O2 MIN 80%)  CPAP- q night , done with [facility]  study done at  [facility]   Squamous cell carcinoma in situ 2015   Thyroid disease    Nodules   Tubular adenoma    DR. SCHOOLER   Varicose veins     Past Medical History, Surgical history, Social history, and Family history were reviewed and updated as appropriate.   Please see  review of systems for further details on the patient's review from today.   Objective:   Physical Exam:  There were no vitals taken for this visit.  Physical Exam Constitutional:      General: She is not in acute distress. Musculoskeletal:        General: No deformity.  Neurological:     Mental Status: She is alert and oriented to person, place, and time.     Coordination: Coordination normal.  Psychiatric:        Attention and Perception: Attention and perception normal. She does not perceive auditory or visual hallucinations.        Mood and Affect: Mood normal. Mood is not anxious or depressed. Affect is not labile, blunt, angry or inappropriate.        Speech: Speech normal.        Behavior: Behavior normal.        Thought Content: Thought content normal. Thought content is not paranoid or delusional. Thought content does not include homicidal or suicidal ideation. Thought content does not include homicidal or suicidal plan.        Cognition and Memory: Cognition and memory normal.        Judgment: Judgment normal.     Comments: Insight intact     Lab Review:     Component Value Date/Time   NA 130 (L) 12/01/2019 0321   K 4.2 12/01/2019 0321   CL 98 12/01/2019 0321   CO2 23 12/01/2019 0321   GLUCOSE 94 12/01/2019  0321   BUN 14 12/01/2019 0321   CREATININE 0.84 12/01/2019 0321   CALCIUM 8.3 (L) 12/01/2019 0321   PROT 7.4 11/24/2019 1450   ALBUMIN 4.0 11/24/2019 1450   AST 17 11/24/2019 1450   ALT 18 11/24/2019 1450   ALKPHOS 82 11/24/2019 1450   BILITOT 0.4 11/24/2019 1450   GFRNONAA >60 12/01/2019 0321   GFRAA >60 12/01/2019 0321       Component Value Date/Time   WBC 7.4 12/01/2019 0321   RBC 3.29 (L) 12/01/2019 0321   HGB 10.0 (L) 12/01/2019 0321   HCT 31.0 (L) 12/01/2019 0321   PLT 244 12/01/2019 0321   MCV 94.2 12/01/2019 0321   MCH 30.4 12/01/2019 0321   MCHC 32.3 12/01/2019 0321   RDW 15.8 (H) 12/01/2019 0321   LYMPHSABS 1.9 11/24/2019 1450   MONOABS 0.6 11/24/2019 1450   EOSABS 0.2 11/24/2019 1450   BASOSABS 0.0 11/24/2019 1450    No results found for: "POCLITH", "LITHIUM"   No results found for: "PHENYTOIN", "PHENOBARB", "VALPROATE", "CBMZ"   .res Assessment: Plan:    Plan:  D/C Prozac 40mg  daily - taper discussed Add Vraylar 1.5mg  daily - samples given/script sent  Clonazepam 1mg  TID Nuvigil 250mg  tablet every morning for sleep apnea Hydroxyzine 25mg  TID prn anxiety - taking once a day  RTC 3 months  Patient advised to contact office with any questions, adverse effects, or acute worsening in signs and symptoms.  Discussed potential benefits, risk, and side effects of benzodiazepines to include potential risk of tolerance and dependence, as well as possible drowsiness.  Advised patient not to drive if experiencing drowsiness and to take lowest possible effective dose to minimize risk of dependence and tolerance.  There are no diagnoses linked to this encounter.   Please see After Visit Summary for patient specific instructions.  Future Appointments  Date Time Provider Department Center  04/30/2023  1:20 PM Linnea Todisco, Thereasa Solo, NP [facility] None  07/04/2023  2:00 PM  Tarnisha Kachmar, Thereasa Solo, NP [facility] None  09/12/2023  1:30 PM [facility] DX DEXA 1 [facility]  [facility]    No orders of the defined types were placed in this encounter.   -------------------------------

## 2023-05-01 DIAGNOSIS — D125 Benign neoplasm of sigmoid colon: Secondary | ICD-10-CM | POA: Diagnosis not present

## 2023-05-01 DIAGNOSIS — R2689 Other abnormalities of gait and mobility: Secondary | ICD-10-CM | POA: Diagnosis not present

## 2023-05-01 DIAGNOSIS — M542 Cervicalgia: Secondary | ICD-10-CM | POA: Diagnosis not present

## 2023-05-01 DIAGNOSIS — D122 Benign neoplasm of ascending colon: Secondary | ICD-10-CM | POA: Diagnosis not present

## 2023-05-03 DIAGNOSIS — R2689 Other abnormalities of gait and mobility: Secondary | ICD-10-CM | POA: Diagnosis not present

## 2023-05-03 DIAGNOSIS — M542 Cervicalgia: Secondary | ICD-10-CM | POA: Diagnosis not present

## 2023-05-08 DIAGNOSIS — M5136 Other intervertebral disc degeneration, lumbar region: Secondary | ICD-10-CM | POA: Diagnosis not present

## 2023-05-08 DIAGNOSIS — M9905 Segmental and somatic dysfunction of pelvic region: Secondary | ICD-10-CM | POA: Diagnosis not present

## 2023-05-08 DIAGNOSIS — M9901 Segmental and somatic dysfunction of cervical region: Secondary | ICD-10-CM | POA: Diagnosis not present

## 2023-05-08 DIAGNOSIS — M9902 Segmental and somatic dysfunction of thoracic region: Secondary | ICD-10-CM | POA: Diagnosis not present

## 2023-05-08 DIAGNOSIS — S29012A Strain of muscle and tendon of back wall of thorax, initial encounter: Secondary | ICD-10-CM | POA: Diagnosis not present

## 2023-05-08 DIAGNOSIS — M4802 Spinal stenosis, cervical region: Secondary | ICD-10-CM | POA: Diagnosis not present

## 2023-05-13 DIAGNOSIS — R7303 Prediabetes: Secondary | ICD-10-CM | POA: Diagnosis not present

## 2023-05-13 DIAGNOSIS — E039 Hypothyroidism, unspecified: Secondary | ICD-10-CM | POA: Diagnosis not present

## 2023-05-13 DIAGNOSIS — Z79899 Other long term (current) drug therapy: Secondary | ICD-10-CM | POA: Diagnosis not present

## 2023-05-14 DIAGNOSIS — Z79899 Other long term (current) drug therapy: Secondary | ICD-10-CM | POA: Diagnosis not present

## 2023-05-20 DIAGNOSIS — E871 Hypo-osmolality and hyponatremia: Secondary | ICD-10-CM | POA: Diagnosis not present

## 2023-05-23 ENCOUNTER — Encounter: Payer: Self-pay | Admitting: Adult Health

## 2023-05-23 ENCOUNTER — Ambulatory Visit (INDEPENDENT_AMBULATORY_CARE_PROVIDER_SITE_OTHER): Payer: Medicare Other | Admitting: Adult Health

## 2023-05-23 DIAGNOSIS — G4700 Insomnia, unspecified: Secondary | ICD-10-CM

## 2023-05-23 DIAGNOSIS — F331 Major depressive disorder, recurrent, moderate: Secondary | ICD-10-CM

## 2023-05-23 DIAGNOSIS — G4730 Sleep apnea, unspecified: Secondary | ICD-10-CM | POA: Diagnosis not present

## 2023-05-23 DIAGNOSIS — F411 Generalized anxiety disorder: Secondary | ICD-10-CM

## 2023-05-23 MED ORDER — ARIPIPRAZOLE 2 MG PO TABS: 2.0000 mg | ORAL_TABLET | Freq: Every day | ORAL | 2 refills | Status: DC

## 2023-05-23 MED ORDER — HYDROXYZINE HCL 25 MG PO TABS
25.0000 mg | ORAL_TABLET | Freq: Three times a day (TID) | ORAL | 2 refills | Status: DC | PRN
Start: 2023-05-23 — End: 2023-06-12

## 2023-05-23 NOTE — Progress Notes (Signed)
Olivia [name] [id number] Feb 04, 1951 72 y.o.  Subjective:   Patient ID:  Olivia [name] is a 72 y.o. (DOB 1950/10/09) female.  Chief Complaint: No chief complaint on file.   HPI Olivia [name] presents to the office today for follow-up of depression, anxiety, sleep apnea, and insomnia.  Describes mood today as "about the same". Pleasant. Denies tearfulness. Mood symptoms - reports depression  and irritability. Feels anxious at times. Denies panic attacks. Denies worry, rumination, and over thinking. Reports ongoing situational stressors. Mood is lower. Stating "I feel depressed". Reports tapering off the Prozac. Started on the Vraylar - "not helpful". Willing to try Rexulti. Reports she is having to taper off of the Prozac due to low sodium.  Working with pain management clinic. Varying interest and motivation. Taking medications as prescribed. Energy levels improved. Active, does not have a regular exercise routine. Enjoys some usual interests and activities. Single. Lives with son. Talking to with friends.  Appetite adequate. Weight stable. Sleeps better some nights than others. Averages 8.5 hours of broken sleep. Using CPAP - has new machine. Denies daytime napping. Focus and concentration improved. Completing tasks. Managing some aspects of household. Retired. Denies SI or HI.  Denies AH or VH.  Denies self harm. Denies substance use.   Previous medications: Wellbutrin - bad reaction, Rexulti, Vraylar    Review of Systems:  Review of Systems  Musculoskeletal:  Negative for gait problem.  Psychiatric/Behavioral:         Please refer to HPI    Medications: I have reviewed the patient's current medications.  Current Outpatient Medications  Medication Sig Dispense Refill   acetaminophen (TYLENOL) 500 MG tablet Take 2 tablets (1,000 mg total) by mouth every 6 (six) hours as needed for mild pain or moderate pain. 30 tablet 0   amLODipine (NORVASC) 5 MG tablet TAKE 1 TABLET(5 MG) BY MOUTH  DAILY 30 tablet 5   Armodafinil 250 MG tablet TAKE 1 TABLET(250 MG) BY MOUTH DAILY 30 tablet 2   aspirin EC 325 MG EC tablet Take 1 tablet (325 mg total) by mouth 2 (two) times daily. (Patient not taking: Reported on 01/04/2021) 30 tablet 0   atenolol (TENORMIN) 25 MG tablet Take 25 mg by mouth at bedtime.      azelastine (ASTELIN) 0.1 % nasal spray Place 2 sprays into both nostrils daily as needed for rhinitis. Use in each nostril as directed 30 mL 3   azithromycin (ZITHROMAX) 250 MG tablet Take 2 tablets today then 1 tablet daily until gone 6 tablet 0   benzonatate (TESSALON) 100 MG capsule TAKE 1 CAPSULE(100 MG) BY MOUTH TWICE DAILY AS NEEDED FOR COUGH 60 capsule 2   CALCIUM-MAGNESIUM-VITAMIN D PO Take 1 tablet by mouth daily.     cariprazine (VRAYLAR) 1.5 MG capsule Take 1 capsule (1.5 mg total) by mouth daily. 30 capsule 2   cetirizine (ZYRTEC) 10 MG tablet TAKE 1 TABLET(10 MG) BY MOUTH DAILY 90 tablet 0   esomeprazole (NEXIUM) 40 MG capsule Take 40 mg by mouth 2 (two) times daily before a meal.      fluticasone (FLONASE) 50 MCG/ACT nasal spray SHAKE LIQUID AND USE 1 SPRAY IN EACH NOSTRIL DAILY 16 g 8   hydrOXYzine (ATARAX) 25 MG tablet Take 1 tablet (25 mg total) by mouth 3 (three) times daily as needed. 90 tablet 0   KLONOPIN 1 MG tablet TAKE 1 TABLET(1 MG) BY MOUTH THREE TIMES DAILY AS NEEDED FOR ANXIETY 90 tablet 2   lisinopril (  PRINIVIL,ZESTRIL) 20 MG tablet Take 20 mg by mouth daily.  (Patient not taking: Reported on 01/04/2021)  1   methocarbamol (ROBAXIN) 500 MG tablet Take 1-2 tablets (500-1,000 mg total) by mouth every 6 (six) hours as needed for muscle spasms. 60 tablet 0   Multiple Vitamin (MULITIVITAMIN WITH MINERALS) TABS Take 1 tablet by mouth daily.     predniSONE (DELTASONE) 20 MG tablet Take 40 mg daily x 5 days 10 tablet 0   sodium chloride (OCEAN) 0.65 % SOLN nasal spray Place 2 sprays into both nostrils as needed for congestion. 30 mL 2   SYNTHROID 125 MCG tablet Take 125 mcg  by mouth at bedtime.      vitamin C (ASCORBIC ACID) 500 MG tablet Take 500 mg by mouth 2 (two) times a week.      No current facility-administered medications for this visit.    Medication Side Effects: None  Allergies:  Allergies  Allergen Reactions   Demerol [Meperidine] Nausea And Vomiting   Other     Other reaction(s): hives   Penicillins Other (See Comments)    Unknown childhood allergy  Has patient had a PCN reaction causing immediate rash, facial/tongue/throat swelling, SOB or lightheadedness with hypotension: Unknown  Has patient had a PCN reaction causing severe rash involving mucus membranes or skin necrosis: Unknown  Has patient had a PCN reaction that required hospitalization: Unknown  Has patient had a PCN reaction occurring within the last 10 years: No  If all of the above answers are "NO", then may proceed with Cephalosporin use.  Other Reaction(s): Childhood Allergy, Other (See Comments)  Unknown childhood allergy, Has patient had a PCN reaction causing immediate rash, facial/tongue/throat swelling, SOB or lightheadedness with hypotension: Unknown, Has patient had a PCN reaction causing severe rash involving mucus membranes or skin necrosis: Unknown, Has patient had a PCN reaction that required hospitalization: Unknown, Has patient had a PCN reaction occurring within the last 10 years: No, If all of the above answers are "NO", then may proceed with Cephalosporin use.    Past Medical History:  Diagnosis Date   Anxiety    [facility]   Arthritis    Cataract    THESE BEEN REPLACED   Depression    [facility]   Diverticulosis    Dry eye syndrome    GERD (gastroesophageal reflux disease)    History of colon polyps 12/03/2012   COLONOSCOPY   History of exercise stress test 2010   Dr. Kym Groom    Hyperlipidemia    Hypertension    Hypothyroidism    Multinodular goiter    DR. KERR   OSA (obstructive sleep apnea)    (PSG 12/12/15 ESS  2, AHI 42/HR REM 30/HR, O2 MIN 80%)  CPAP- q night , done with [facility].  study done at [facility]   Squamous cell carcinoma in situ 2015   Thyroid disease    Nodules   Tubular adenoma    DR. SCHOOLER   Varicose veins     Past Medical History, Surgical history, Social history, and Family history were reviewed and updated as appropriate.   Please see review of systems for further details on the patient's review from today.   Objective:   Physical Exam:  There were no vitals taken for this visit.  Physical Exam Constitutional:      General: She is not in acute distress. Musculoskeletal:        General: No deformity.  Neurological:  Mental Status: She is alert and oriented to person, place, and time.     Coordination: Coordination normal.  Psychiatric:        Attention and Perception: Attention and perception normal. She does not perceive auditory or visual hallucinations.        Mood and Affect: Affect is not labile, blunt, angry or inappropriate.        Speech: Speech normal.        Behavior: Behavior normal.        Thought Content: Thought content normal. Thought content is not paranoid or delusional. Thought content does not include homicidal or suicidal ideation. Thought content does not include homicidal or suicidal plan.        Cognition and Memory: Cognition and memory normal.        Judgment: Judgment normal.     Comments: Insight intact     Lab Review:     Component Value Date/Time   NA 130 (L) 12/01/2019 0321   K 4.2 12/01/2019 0321   CL 98 12/01/2019 0321   CO2 23 12/01/2019 0321   GLUCOSE 94 12/01/2019 0321   BUN 14 12/01/2019 0321   CREATININE 0.84 12/01/2019 0321   CALCIUM 8.3 (L) 12/01/2019 0321   PROT 7.4 11/24/2019 1450   ALBUMIN 4.0 11/24/2019 1450   AST 17 11/24/2019 1450   ALT 18 11/24/2019 1450   ALKPHOS 82 11/24/2019 1450   BILITOT 0.4 11/24/2019 1450   GFRNONAA >60 12/01/2019 0321   GFRAA >60 12/01/2019 0321       Component Value  Date/Time   WBC 7.4 12/01/2019 0321   RBC 3.29 (L) 12/01/2019 0321   HGB 10.0 (L) 12/01/2019 0321   HCT 31.0 (L) 12/01/2019 0321   PLT 244 12/01/2019 0321   MCV 94.2 12/01/2019 0321   MCH 30.4 12/01/2019 0321   MCHC 32.3 12/01/2019 0321   RDW 15.8 (H) 12/01/2019 0321   LYMPHSABS 1.9 11/24/2019 1450   MONOABS 0.6 11/24/2019 1450   EOSABS 0.2 11/24/2019 1450   BASOSABS 0.0 11/24/2019 1450    No results found for: "POCLITH", "LITHIUM"   No results found for: "PHENYTOIN", "PHENOBARB", "VALPROATE", "CBMZ"   .res Assessment: Plan:    Plan:  D/C Prozac 40mg  daily - taper discussed  D/C Vraylar 1.5mg  daily - samples given/script sent Add Rexulti 0.5mg  daily  Clonazepam 1mg  TID Nuvigil 250mg  tablet every morning for sleep apnea Hydroxyzine 25mg  TID prn anxiety - taking once a day  RTC 3 months  Patient advised to contact office with any questions, adverse effects, or acute worsening in signs and symptoms.  Discussed potential benefits, risk, and side effects of benzodiazepines to include potential risk of tolerance and dependence, as well as possible drowsiness.  Advised patient not to drive if experiencing drowsiness and to take lowest possible effective dose to minimize risk of dependence and tolerance.  There are no diagnoses linked to this encounter.   Please see After Visit Summary for patient specific instructions.  Future Appointments  Date Time Provider Department Center  05/23/2023  1:00 PM Kadance Mccuistion, Thereasa Solo, NP [facility] None  07/04/2023  2:00 PM Angee Gupton, Thereasa Solo, NP [facility] None  09/12/2023  1:30 PM [facility] DX DEXA 1 [facility]    No orders of the defined types were placed in this encounter.   -------------------------------

## 2023-05-31 ENCOUNTER — Telehealth: Payer: Self-pay

## 2023-05-31 NOTE — Telephone Encounter (Signed)
Patient advised. She wants to know what to do next as far as medication. She was on Vraylar prior to Abilify, didn't have any reaction to it, but didn't think it was working. Will call back on Tuesday for recommendations.

## 2023-06-04 NOTE — Telephone Encounter (Signed)
Patient reports she's had lots of sx and will call to schedule an earlier appt with Almira Coaster.

## 2023-06-05 DIAGNOSIS — M9905 Segmental and somatic dysfunction of pelvic region: Secondary | ICD-10-CM | POA: Diagnosis not present

## 2023-06-05 DIAGNOSIS — M4802 Spinal stenosis, cervical region: Secondary | ICD-10-CM | POA: Diagnosis not present

## 2023-06-05 DIAGNOSIS — M5136 Other intervertebral disc degeneration, lumbar region: Secondary | ICD-10-CM | POA: Diagnosis not present

## 2023-06-05 DIAGNOSIS — M9902 Segmental and somatic dysfunction of thoracic region: Secondary | ICD-10-CM | POA: Diagnosis not present

## 2023-06-05 DIAGNOSIS — M9901 Segmental and somatic dysfunction of cervical region: Secondary | ICD-10-CM | POA: Diagnosis not present

## 2023-06-05 DIAGNOSIS — S29012A Strain of muscle and tendon of back wall of thorax, initial encounter: Secondary | ICD-10-CM | POA: Diagnosis not present

## 2023-06-06 DIAGNOSIS — M542 Cervicalgia: Secondary | ICD-10-CM | POA: Diagnosis not present

## 2023-06-06 DIAGNOSIS — R2689 Other abnormalities of gait and mobility: Secondary | ICD-10-CM | POA: Diagnosis not present

## 2023-06-11 ENCOUNTER — Ambulatory Visit: Payer: Medicare Other | Admitting: Adult Health

## 2023-06-12 ENCOUNTER — Encounter: Payer: Self-pay | Admitting: Adult Health

## 2023-06-12 ENCOUNTER — Telehealth (INDEPENDENT_AMBULATORY_CARE_PROVIDER_SITE_OTHER): Payer: Medicare Other | Admitting: Adult Health

## 2023-06-12 DIAGNOSIS — G4730 Sleep apnea, unspecified: Secondary | ICD-10-CM | POA: Diagnosis not present

## 2023-06-12 DIAGNOSIS — G4700 Insomnia, unspecified: Secondary | ICD-10-CM

## 2023-06-12 DIAGNOSIS — F32A Depression, unspecified: Secondary | ICD-10-CM | POA: Diagnosis not present

## 2023-06-12 DIAGNOSIS — F419 Anxiety disorder, unspecified: Secondary | ICD-10-CM | POA: Diagnosis not present

## 2023-06-12 DIAGNOSIS — F411 Generalized anxiety disorder: Secondary | ICD-10-CM

## 2023-06-12 DIAGNOSIS — F331 Major depressive disorder, recurrent, moderate: Secondary | ICD-10-CM

## 2023-06-12 MED ORDER — HYDROXYZINE HCL 25 MG PO TABS
25.0000 mg | ORAL_TABLET | Freq: Three times a day (TID) | ORAL | 2 refills | Status: DC | PRN
Start: 2023-06-12 — End: 2023-06-21

## 2023-06-12 MED ORDER — KLONOPIN 1 MG PO TABS
ORAL_TABLET | ORAL | 2 refills | Status: DC
Start: 2023-06-12 — End: 2023-08-22

## 2023-06-12 MED ORDER — ARMODAFINIL 250 MG PO TABS: ORAL_TABLET | ORAL | 2 refills | Status: DC
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
## 2023-06-13 DIAGNOSIS — G8929 Other chronic pain: Secondary | ICD-10-CM | POA: Diagnosis not present

## 2023-06-18 DIAGNOSIS — R2689 Other abnormalities of gait and mobility: Secondary | ICD-10-CM | POA: Diagnosis not present

## 2023-06-18 DIAGNOSIS — M542 Cervicalgia: Secondary | ICD-10-CM | POA: Diagnosis not present

## 2023-06-21 ENCOUNTER — Encounter: Payer: Self-pay | Admitting: Adult Health

## 2023-06-21 ENCOUNTER — Ambulatory Visit (INDEPENDENT_AMBULATORY_CARE_PROVIDER_SITE_OTHER): Payer: Medicare Other | Admitting: Adult Health

## 2023-06-21 DIAGNOSIS — G4700 Insomnia, unspecified: Secondary | ICD-10-CM

## 2023-06-21 DIAGNOSIS — F411 Generalized anxiety disorder: Secondary | ICD-10-CM | POA: Diagnosis not present

## 2023-06-21 DIAGNOSIS — G4730 Sleep apnea, unspecified: Secondary | ICD-10-CM | POA: Diagnosis not present

## 2023-06-21 DIAGNOSIS — F331 Major depressive disorder, recurrent, moderate: Secondary | ICD-10-CM | POA: Diagnosis not present

## 2023-06-21 MED ORDER — HYDROXYZINE HCL 25 MG PO TABS
25.0000 mg | ORAL_TABLET | Freq: Three times a day (TID) | ORAL | 2 refills | Status: AC | PRN
Start: 2023-06-21 — End: ?

## 2023-06-21 MED ORDER — FLUOXETINE HCL 20 MG PO CAPS
20.0000 mg | ORAL_CAPSULE | Freq: Every day | ORAL | 2 refills | Status: DC
Start: 2023-06-21 — End: 2023-07-17

## 2023-06-21 NOTE — Progress Notes (Signed)
Olivia JETTY [id number] 1950-10-27 72 y.o.  Subjective:   Patient ID:  Olivia [name] is a 72 y.o. (DOB [birth year]) female.  Chief Complaint: No chief complaint on file.   HPI Olivia [name] presents to the office today for follow-up of depression, anxiety and insomnia.  Describes mood today as "about the same". Pleasant. Denies tearfulness. Mood symptoms - reports anxiety, depression and irritability. Denies panic attacks. Denies worry, rumination, and over thinking. Reports situational stressors surrounding her health. Mood is lower. Stating "I'm not feeling good". Reports side effects from Abilify have subsided. Willing to consider other options.   Reports ongoing side effects and does not want to start further medication at this visit, but is willing to consider other options. Varying interest and motivation. Taking medications as prescribed. Energy levels lower - feels tired. Active, does not have a regular exercise routine. Enjoys some usual interests and activities. Single. Lives with son. Talking to with friends.  Appetite decreased. Weight stable. Sleeps better some nights than others. Averages 6 to 7 hours. Using CPAP. Denies daytime napping. Focus and concentration stable. Completing tasks. Managing some aspects of household. Retired. Denies SI or HI.  Denies AH or VH.  Denies self harm. Denies substance use.   Previous medications: Wellbutrin - bad reaction, Rexulti, Vraylar, Abilify   Review of Systems:  Review of Systems  Musculoskeletal:  Negative for gait problem.  Neurological:  Negative for tremors.  Psychiatric/Behavioral:         Please refer to HPI    Medications: I have reviewed the patient's current medications.  Current Outpatient Medications  Medication Sig Dispense Refill   acetaminophen (TYLENOL) 500 MG tablet Take 2 tablets (1,000 mg total) by mouth every 6 (six) hours as needed for mild pain or moderate pain. 30 tablet 0   amLODipine (NORVASC) 5 MG tablet  TAKE 1 TABLET(5 MG) BY MOUTH DAILY 30 tablet 5   Armodafinil 250 MG tablet TAKE 1 TABLET(250 MG) BY MOUTH DAILY 30 tablet 2   aspirin EC 325 MG EC tablet Take 1 tablet (325 mg total) by mouth 2 (two) times daily. (Patient not taking: Reported on 01/04/2021) 30 tablet 0   atenolol (TENORMIN) 25 MG tablet Take 25 mg by mouth at bedtime.      azelastine (ASTELIN) 0.1 % nasal spray Place 2 sprays into both nostrils daily as needed for rhinitis. Use in each nostril as directed 30 mL 3   azithromycin (ZITHROMAX) 250 MG tablet Take 2 tablets today then 1 tablet daily until gone 6 tablet 0   benzonatate (TESSALON) 100 MG capsule TAKE 1 CAPSULE(100 MG) BY MOUTH TWICE DAILY AS NEEDED FOR COUGH 60 capsule 2   CALCIUM-MAGNESIUM-VITAMIN D PO Take 1 tablet by mouth daily.     cetirizine (ZYRTEC) 10 MG tablet TAKE 1 TABLET(10 MG) BY MOUTH DAILY 90 tablet 0   esomeprazole (NEXIUM) 40 MG capsule Take 40 mg by mouth 2 (two) times daily before a meal.      fluticasone (FLONASE) 50 MCG/ACT nasal spray SHAKE LIQUID AND USE 1 SPRAY IN EACH NOSTRIL DAILY 16 g 8   hydrOXYzine (ATARAX) 25 MG tablet Take 1 tablet (25 mg total) by mouth 3 (three) times daily as needed. 90 tablet 2   KLONOPIN 1 MG tablet TAKE 1 TABLET(1 MG) BY MOUTH THREE TIMES DAILY AS NEEDED FOR ANXIETY 90 tablet 2   lisinopril (PRINIVIL,ZESTRIL) 20 MG tablet Take 20 mg by mouth daily.  (Patient not taking: Reported on 01/04/2021)  1   methocarbamol (ROBAXIN) 500 MG tablet Take 1-2 tablets (500-1,000 mg total) by mouth every 6 (six) hours as needed for muscle spasms. 60 tablet 0   Multiple Vitamin (MULITIVITAMIN WITH MINERALS) TABS Take 1 tablet by mouth daily.     predniSONE (DELTASONE) 20 MG tablet Take 40 mg daily x 5 days 10 tablet 0   sodium chloride (OCEAN) 0.65 % SOLN nasal spray Place 2 sprays into both nostrils as needed for congestion. 30 mL 2   SYNTHROID 125 MCG tablet Take 125 mcg by mouth at bedtime.      vitamin C (ASCORBIC ACID) 500 MG tablet  Take 500 mg by mouth 2 (two) times a week.      No current facility-administered medications for this visit.    Medication Side Effects: None  Allergies:  Allergies  Allergen Reactions   Abilify [Aripiprazole] Hives   Demerol [Meperidine] Nausea And Vomiting   Other     Other reaction(s): hives   Penicillins Other (See Comments)    Unknown childhood allergy  Has patient had a PCN reaction causing immediate rash, facial/tongue/throat swelling, SOB or lightheadedness with hypotension: Unknown  Has patient had a PCN reaction causing severe rash involving mucus membranes or skin necrosis: Unknown  Has patient had a PCN reaction that required hospitalization: Unknown  Has patient had a PCN reaction occurring within the last 10 years: No  If all of the above answers are "NO", then may proceed with Cephalosporin use.  Other Reaction(s): Childhood Allergy, Other (See Comments)  Unknown childhood allergy, Has patient had a PCN reaction causing immediate rash, facial/tongue/throat swelling, SOB or lightheadedness with hypotension: Unknown, Has patient had a PCN reaction causing severe rash involving mucus membranes or skin necrosis: Unknown, Has patient had a PCN reaction that required hospitalization: Unknown, Has patient had a PCN reaction occurring within the last 10 years: No, If all of the above answers are "NO", then may proceed with Cephalosporin use.    Past Medical History:  Diagnosis Date   Anxiety    [facility]   Arthritis    Cataract    THESE BEEN REPLACED   Depression    [facility]   Diverticulosis    Dry eye syndrome    GERD (gastroesophageal reflux disease)    History of colon polyps 12/03/2012   COLONOSCOPY   History of exercise stress test 2010   Dr. Kym Groom    Hyperlipidemia    Hypertension    Hypothyroidism    Multinodular goiter    DR. KERR   OSA (obstructive sleep apnea)    (PSG 12/12/15 ESS 2, AHI 42/HR REM 30/HR, O2 MIN 80%)   CPAP- q night , done with [facility]  study done at [facility]   Squamous cell carcinoma in situ 2015   Thyroid disease    Nodules   Tubular adenoma    DR. SCHOOLER   Varicose veins     Past Medical History, Surgical history, Social history, and Family history were reviewed and updated as appropriate.   Please see review of systems for further details on the patient's review from today.   Objective:   Physical Exam:  There were no vitals taken for this visit.  Physical Exam Constitutional:      General: She is not in acute distress. Musculoskeletal:        General: No deformity.  Neurological:     Mental Status: She is alert and oriented to person, place, and time.  Coordination: Coordination normal.  Psychiatric:        Attention and Perception: Attention and perception normal. She does not perceive auditory or visual hallucinations.        Mood and Affect: Mood normal. Mood is not anxious or depressed. Affect is not labile, blunt, angry or inappropriate.        Speech: Speech normal.        Behavior: Behavior normal.        Thought Content: Thought content normal. Thought content is not paranoid or delusional. Thought content does not include homicidal or suicidal ideation. Thought content does not include homicidal or suicidal plan.        Cognition and Memory: Cognition and memory normal.        Judgment: Judgment normal.     Comments: Insight intact     Lab Review:     Component Value Date/Time   NA 130 (L) 12/01/2019 0321   K 4.2 12/01/2019 0321   CL 98 12/01/2019 0321   CO2 23 12/01/2019 0321   GLUCOSE 94 12/01/2019 0321   BUN 14 12/01/2019 0321   CREATININE 0.84 12/01/2019 0321   CALCIUM 8.3 (L) 12/01/2019 0321   PROT 7.4 11/24/2019 1450   ALBUMIN 4.0 11/24/2019 1450   AST 17 11/24/2019 1450   ALT 18 11/24/2019 1450   ALKPHOS 82 11/24/2019 1450   BILITOT 0.4 11/24/2019 1450   GFRNONAA >60 12/01/2019 0321   GFRAA >60 12/01/2019 0321        Component Value Date/Time   WBC 7.4 12/01/2019 0321   RBC 3.29 (L) 12/01/2019 0321   HGB 10.0 (L) 12/01/2019 0321   HCT 31.0 (L) 12/01/2019 0321   PLT 244 12/01/2019 0321   MCV 94.2 12/01/2019 0321   MCH 30.4 12/01/2019 0321   MCHC 32.3 12/01/2019 0321   RDW 15.8 (H) 12/01/2019 0321   LYMPHSABS 1.9 11/24/2019 1450   MONOABS 0.6 11/24/2019 1450   EOSABS 0.2 11/24/2019 1450   BASOSABS 0.0 11/24/2019 1450    No results found for: "POCLITH", "LITHIUM"   No results found for: "PHENYTOIN", "PHENOBARB", "VALPROATE", "CBMZ"   .res Assessment: Plan:    Plan:  Add Prozac 20mg  daily - will monitor sodium levels  Clonazepam 1mg  TID Nuvigil 250mg  tablet every morning for sleep apnea Hydroxyzine 25mg  TID prn anxiety - taking once a day  09/20/ 2024 - 126/67/81  RTC 3 months  Patient advised to contact office with any questions, adverse effects, or acute worsening in signs and symptoms.  Discussed potential benefits, risk, and side effects of benzodiazepines to include potential risk of tolerance and dependence, as well as possible drowsiness.  Advised patient not to drive if experiencing drowsiness and to take lowest possible effective dose to minimize risk of dependence and tolerance.  There are no diagnoses linked to this encounter.   Please see After Visit Summary for patient specific instructions.  Future Appointments  Date Time Provider Department Center  06/21/2023  1:00 PM Neomia Herbel, Thereasa Solo, NP [facility] None  09/12/2023  1:30 PM [facility] DX DEXA 1 [facility]    No orders of the defined types were placed in this encounter.   -------------------------------

## 2023-06-25 ENCOUNTER — Ambulatory Visit: Payer: Medicare Other | Admitting: Adult Health

## 2023-06-27 ENCOUNTER — Telehealth: Payer: Self-pay | Admitting: Adult Health

## 2023-06-27 DIAGNOSIS — R2689 Other abnormalities of gait and mobility: Secondary | ICD-10-CM | POA: Diagnosis not present

## 2023-06-27 DIAGNOSIS — M542 Cervicalgia: Secondary | ICD-10-CM | POA: Diagnosis not present

## 2023-06-27 NOTE — Telephone Encounter (Signed)
Please call patient and let her know Gina's recommendation.

## 2023-06-27 NOTE — Telephone Encounter (Signed)
Patient lvm at 11:33 stating that she was to call [facility] after Endocrinology visit. States that her doctor was not there and will not be back until November. She would like to go ahead an up her Prozac 20mg  to 40mg . She needs to know if she can go ahead and start or if she needs to wait. She also needs prescription sent to [facility] [address] [place], [place] Ph: [phone]

## 2023-06-27 NOTE — Telephone Encounter (Signed)
LVM TO RC

## 2023-06-27 NOTE — Telephone Encounter (Signed)
Please see message and advise if dose can be increased now. Will send as appropriate.

## 2023-06-28 NOTE — Telephone Encounter (Signed)
Spoke with pt she states her endocrinologist wont be back in the office until middle of Nov, so it will be over a month before she can have sodium level checked. She states she will call if she needs her medication before then.

## 2023-07-03 NOTE — Telephone Encounter (Signed)
Patient called back into office regarding prior messages. She states she is dragging and miserable and would like to go back to Prozac 40mg . Ph: [phone] Pharmacy [facility] [address] Jacky Kindle

## 2023-07-04 ENCOUNTER — Ambulatory Visit: Payer: Medicare Other | Admitting: Adult Health

## 2023-07-04 DIAGNOSIS — R2689 Other abnormalities of gait and mobility: Secondary | ICD-10-CM | POA: Diagnosis not present

## 2023-07-04 DIAGNOSIS — M542 Cervicalgia: Secondary | ICD-10-CM | POA: Diagnosis not present

## 2023-07-04 NOTE — Telephone Encounter (Signed)
Pt called at 11:16a.  She said she has been on the Prozac 20mg  for about 2 wks now.  She is not doing well.  She feels she needs to go back to 40mg .  She is taking a salt supplement.  No upcoming appt scheduled.

## 2023-07-06 NOTE — Telephone Encounter (Signed)
Spoke with patient yesterday. She has not been on the medication long enough to get full benefit. Olivia [name] wants her to FU with endocrinologist for a sodium level before considering an increase in dose. She is asking if Olivia [name] will order a sodium level.

## 2023-07-08 DIAGNOSIS — G4733 Obstructive sleep apnea (adult) (pediatric): Secondary | ICD-10-CM | POA: Diagnosis not present

## 2023-07-08 DIAGNOSIS — Z1331 Encounter for screening for depression: Secondary | ICD-10-CM | POA: Diagnosis not present

## 2023-07-08 DIAGNOSIS — F419 Anxiety disorder, unspecified: Secondary | ICD-10-CM | POA: Diagnosis not present

## 2023-07-08 DIAGNOSIS — E871 Hypo-osmolality and hyponatremia: Secondary | ICD-10-CM | POA: Diagnosis not present

## 2023-07-08 DIAGNOSIS — K5900 Constipation, unspecified: Secondary | ICD-10-CM | POA: Diagnosis not present

## 2023-07-08 DIAGNOSIS — M79672 Pain in left foot: Secondary | ICD-10-CM | POA: Diagnosis not present

## 2023-07-08 DIAGNOSIS — M62838 Other muscle spasm: Secondary | ICD-10-CM | POA: Diagnosis not present

## 2023-07-08 DIAGNOSIS — M79671 Pain in right foot: Secondary | ICD-10-CM | POA: Diagnosis not present

## 2023-07-08 DIAGNOSIS — I10 Essential (primary) hypertension: Secondary | ICD-10-CM | POA: Diagnosis not present

## 2023-07-08 DIAGNOSIS — I679 Cerebrovascular disease, unspecified: Secondary | ICD-10-CM | POA: Diagnosis not present

## 2023-07-09 NOTE — Telephone Encounter (Signed)
See message f rom patient

## 2023-07-11 DIAGNOSIS — M9905 Segmental and somatic dysfunction of pelvic region: Secondary | ICD-10-CM | POA: Diagnosis not present

## 2023-07-11 DIAGNOSIS — S29012A Strain of muscle and tendon of back wall of thorax, initial encounter: Secondary | ICD-10-CM | POA: Diagnosis not present

## 2023-07-11 DIAGNOSIS — M4802 Spinal stenosis, cervical region: Secondary | ICD-10-CM | POA: Diagnosis not present

## 2023-07-11 DIAGNOSIS — M5416 Radiculopathy, lumbar region: Secondary | ICD-10-CM | POA: Diagnosis not present

## 2023-07-11 DIAGNOSIS — M9901 Segmental and somatic dysfunction of cervical region: Secondary | ICD-10-CM | POA: Diagnosis not present

## 2023-07-11 DIAGNOSIS — M9902 Segmental and somatic dysfunction of thoracic region: Secondary | ICD-10-CM | POA: Diagnosis not present

## 2023-07-12 DIAGNOSIS — Z79899 Other long term (current) drug therapy: Secondary | ICD-10-CM | POA: Diagnosis not present

## 2023-07-15 NOTE — Telephone Encounter (Signed)
Patient reported she had labs drawn at her PCP a week ago. She will bring results with her to her appt this week.

## 2023-07-17 ENCOUNTER — Telehealth (INDEPENDENT_AMBULATORY_CARE_PROVIDER_SITE_OTHER): Payer: Medicare Other | Admitting: Adult Health

## 2023-07-17 ENCOUNTER — Encounter: Payer: Self-pay | Admitting: Adult Health

## 2023-07-17 DIAGNOSIS — F339 Major depressive disorder, recurrent, unspecified: Secondary | ICD-10-CM | POA: Diagnosis not present

## 2023-07-17 DIAGNOSIS — F411 Generalized anxiety disorder: Secondary | ICD-10-CM | POA: Diagnosis not present

## 2023-07-17 DIAGNOSIS — G4700 Insomnia, unspecified: Secondary | ICD-10-CM | POA: Diagnosis not present

## 2023-07-17 MED ORDER — FLUOXETINE HCL 40 MG PO CAPS
40.0000 mg | ORAL_CAPSULE | Freq: Every day | ORAL | 2 refills | Status: DC
Start: 2023-07-17 — End: 2023-10-31
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
## 2023-07-18 DIAGNOSIS — R2689 Other abnormalities of gait and mobility: Secondary | ICD-10-CM | POA: Diagnosis not present

## 2023-07-18 DIAGNOSIS — M542 Cervicalgia: Secondary | ICD-10-CM | POA: Diagnosis not present

## 2023-07-23 ENCOUNTER — Telehealth: Payer: Self-pay | Admitting: Adult Health

## 2023-07-23 DIAGNOSIS — R2689 Other abnormalities of gait and mobility: Secondary | ICD-10-CM | POA: Diagnosis not present

## 2023-07-23 DIAGNOSIS — M542 Cervicalgia: Secondary | ICD-10-CM | POA: Diagnosis not present

## 2023-07-23 NOTE — Telephone Encounter (Signed)
I wondered why we are doing a PA every month. That's very unusual.

## 2023-07-23 NOTE — Telephone Encounter (Signed)
Prior Approval received with [employer] effective through 09/30/2098 for Hydroxyzine 25 mg tablet #90/30 day

## 2023-07-23 NOTE — Telephone Encounter (Signed)
Olivia [name] called at 3:10 to report that her Hydroxyzine needs a PA.  She said the pharmacy sent [name] a form or notice that it is needed. We do have all her correct insurance.

## 2023-07-24 DIAGNOSIS — M25572 Pain in left ankle and joints of left foot: Secondary | ICD-10-CM | POA: Diagnosis not present

## 2023-07-24 DIAGNOSIS — M25571 Pain in right ankle and joints of right foot: Secondary | ICD-10-CM | POA: Diagnosis not present

## 2023-07-24 DIAGNOSIS — M9902 Segmental and somatic dysfunction of thoracic region: Secondary | ICD-10-CM | POA: Diagnosis not present

## 2023-07-24 DIAGNOSIS — M79671 Pain in right foot: Secondary | ICD-10-CM | POA: Diagnosis not present

## 2023-07-24 DIAGNOSIS — M4802 Spinal stenosis, cervical region: Secondary | ICD-10-CM | POA: Diagnosis not present

## 2023-07-24 DIAGNOSIS — M5416 Radiculopathy, lumbar region: Secondary | ICD-10-CM | POA: Diagnosis not present

## 2023-07-24 DIAGNOSIS — M79672 Pain in left foot: Secondary | ICD-10-CM | POA: Diagnosis not present

## 2023-07-24 DIAGNOSIS — M9901 Segmental and somatic dysfunction of cervical region: Secondary | ICD-10-CM | POA: Diagnosis not present

## 2023-07-24 DIAGNOSIS — M9905 Segmental and somatic dysfunction of pelvic region: Secondary | ICD-10-CM | POA: Diagnosis not present

## 2023-07-24 DIAGNOSIS — S29012A Strain of muscle and tendon of back wall of thorax, initial encounter: Secondary | ICD-10-CM | POA: Diagnosis not present

## 2023-08-01 ENCOUNTER — Other Ambulatory Visit: Payer: Self-pay | Admitting: Primary Care

## 2023-08-06 ENCOUNTER — Other Ambulatory Visit: Payer: Self-pay | Admitting: Family Medicine

## 2023-08-06 DIAGNOSIS — Z1231 Encounter for screening mammogram for malignant neoplasm of breast: Secondary | ICD-10-CM

## 2023-08-08 ENCOUNTER — Ambulatory Visit: Payer: Medicare Other | Admitting: Adult Health

## 2023-08-09 DIAGNOSIS — R2689 Other abnormalities of gait and mobility: Secondary | ICD-10-CM | POA: Diagnosis not present

## 2023-08-09 DIAGNOSIS — M542 Cervicalgia: Secondary | ICD-10-CM | POA: Diagnosis not present

## 2023-08-22 ENCOUNTER — Telehealth (INDEPENDENT_AMBULATORY_CARE_PROVIDER_SITE_OTHER): Payer: Medicare Other | Admitting: Adult Health

## 2023-08-22 ENCOUNTER — Encounter: Payer: Self-pay | Admitting: Adult Health

## 2023-08-22 DIAGNOSIS — F32A Depression, unspecified: Secondary | ICD-10-CM

## 2023-08-22 DIAGNOSIS — F419 Anxiety disorder, unspecified: Secondary | ICD-10-CM | POA: Diagnosis not present

## 2023-08-22 DIAGNOSIS — G4700 Insomnia, unspecified: Secondary | ICD-10-CM

## 2023-08-22 DIAGNOSIS — F339 Major depressive disorder, recurrent, unspecified: Secondary | ICD-10-CM

## 2023-08-22 DIAGNOSIS — F411 Generalized anxiety disorder: Secondary | ICD-10-CM

## 2023-08-22 DIAGNOSIS — I83899 Varicose veins of unspecified lower extremities with other complications: Secondary | ICD-10-CM

## 2023-08-22 MED ORDER — KLONOPIN 1 MG PO TABS: ORAL_TABLET | ORAL | 2 refills | Status: DC

## 2023-08-22 MED ORDER — OLANZAPINE 2.5 MG PO TABS: 2.5000 mg | ORAL_TABLET | Freq: Every day | ORAL | 2 refills | Status: DC
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
## 2023-08-26 ENCOUNTER — Ambulatory Visit: Payer: Medicare Other

## 2023-08-27 ENCOUNTER — Telehealth: Payer: Self-pay | Admitting: Adult Health

## 2023-08-27 NOTE — Telephone Encounter (Signed)
Pt lvm that she is doing well on the medication. It is keeping her calm. She said that she will call after thanksgiving and give another report

## 2023-08-28 DIAGNOSIS — M9902 Segmental and somatic dysfunction of thoracic region: Secondary | ICD-10-CM | POA: Diagnosis not present

## 2023-08-28 DIAGNOSIS — M9903 Segmental and somatic dysfunction of lumbar region: Secondary | ICD-10-CM | POA: Diagnosis not present

## 2023-08-28 DIAGNOSIS — M9901 Segmental and somatic dysfunction of cervical region: Secondary | ICD-10-CM | POA: Diagnosis not present

## 2023-08-28 DIAGNOSIS — M5136 Other intervertebral disc degeneration, lumbar region with discogenic back pain only: Secondary | ICD-10-CM | POA: Diagnosis not present

## 2023-08-28 DIAGNOSIS — M4802 Spinal stenosis, cervical region: Secondary | ICD-10-CM | POA: Diagnosis not present

## 2023-09-02 ENCOUNTER — Telehealth: Payer: Self-pay

## 2023-09-02 NOTE — Telephone Encounter (Signed)
Patient notified ok to increase olanzapine to 5 mg. She just filled and will take 2 tablets and let us know in a week how she is doing. New RX for increased dose can be sent if doing well.

## 2023-09-10 NOTE — Telephone Encounter (Signed)
Pt called and said that the 5 mg is working well. She will be out today so please send in a new script

## 2023-09-11 DIAGNOSIS — M4802 Spinal stenosis, cervical region: Secondary | ICD-10-CM | POA: Diagnosis not present

## 2023-09-11 DIAGNOSIS — M5136 Other intervertebral disc degeneration, lumbar region with discogenic back pain only: Secondary | ICD-10-CM | POA: Diagnosis not present

## 2023-09-11 DIAGNOSIS — M9902 Segmental and somatic dysfunction of thoracic region: Secondary | ICD-10-CM | POA: Diagnosis not present

## 2023-09-11 DIAGNOSIS — M9903 Segmental and somatic dysfunction of lumbar region: Secondary | ICD-10-CM | POA: Diagnosis not present

## 2023-09-11 DIAGNOSIS — M9901 Segmental and somatic dysfunction of cervical region: Secondary | ICD-10-CM | POA: Diagnosis not present

## 2023-09-12 ENCOUNTER — Ambulatory Visit
Admission: RE | Admit: 2023-09-12 | Discharge: 2023-09-12 | Disposition: A | Discharge status: Home / Self Care | Payer: Medicare Other | Source: Ambulatory Visit | Attending: Family Medicine | Admitting: Family Medicine

## 2023-09-12 DIAGNOSIS — E2839 Other primary ovarian failure: Secondary | ICD-10-CM

## 2023-09-12 DIAGNOSIS — N958 Other specified menopausal and perimenopausal disorders: Secondary | ICD-10-CM | POA: Diagnosis not present

## 2023-09-12 DIAGNOSIS — M8588 Other specified disorders of bone density and structure, other site: Secondary | ICD-10-CM | POA: Diagnosis not present

## 2023-09-12 MED ORDER — OLANZAPINE 5 MG PO TABS: 5.0000 mg | ORAL_TABLET | Freq: Every day | ORAL | 1 refills | Status: DC

## 2023-09-12 NOTE — Telephone Encounter (Signed)
Rx for 5 mg olanzapine sent, discontinued 2.5 mg dosing in Epic.

## 2023-09-12 NOTE — Telephone Encounter (Signed)
Pt called and said that the Olanzepine doesn't seem to have been sent in for the increase in dose.  [facility] [place] [employer]

## 2023-09-20 ENCOUNTER — Ambulatory Visit: Payer: Medicare Other

## 2023-09-23 ENCOUNTER — Encounter: Payer: Self-pay | Admitting: Adult Health

## 2023-09-23 ENCOUNTER — Telehealth (INDEPENDENT_AMBULATORY_CARE_PROVIDER_SITE_OTHER): Payer: Medicare Other | Admitting: Adult Health

## 2023-09-23 DIAGNOSIS — F411 Generalized anxiety disorder: Secondary | ICD-10-CM

## 2023-09-23 DIAGNOSIS — G4730 Sleep apnea, unspecified: Secondary | ICD-10-CM | POA: Diagnosis not present

## 2023-09-23 DIAGNOSIS — G4700 Insomnia, unspecified: Secondary | ICD-10-CM | POA: Diagnosis not present

## 2023-09-23 DIAGNOSIS — F339 Major depressive disorder, recurrent, unspecified: Secondary | ICD-10-CM | POA: Diagnosis not present
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
## 2023-09-27 DIAGNOSIS — M79672 Pain in left foot: Secondary | ICD-10-CM | POA: Diagnosis not present

## 2023-09-27 DIAGNOSIS — M25572 Pain in left ankle and joints of left foot: Secondary | ICD-10-CM | POA: Diagnosis not present

## 2023-10-01 DIAGNOSIS — M9902 Segmental and somatic dysfunction of thoracic region: Secondary | ICD-10-CM | POA: Diagnosis not present

## 2023-10-01 DIAGNOSIS — M9903 Segmental and somatic dysfunction of lumbar region: Secondary | ICD-10-CM | POA: Diagnosis not present

## 2023-10-01 DIAGNOSIS — M9901 Segmental and somatic dysfunction of cervical region: Secondary | ICD-10-CM | POA: Diagnosis not present

## 2023-10-01 DIAGNOSIS — M4802 Spinal stenosis, cervical region: Secondary | ICD-10-CM | POA: Diagnosis not present

## 2023-10-01 DIAGNOSIS — M5136 Other intervertebral disc degeneration, lumbar region with discogenic back pain only: Secondary | ICD-10-CM | POA: Diagnosis not present

## 2023-10-11 ENCOUNTER — Other Ambulatory Visit: Payer: Self-pay | Admitting: Adult Health

## 2023-10-15 ENCOUNTER — Ambulatory Visit
Admission: RE | Admit: 2023-10-15 | Discharge: 2023-10-15 | Disposition: A | Discharge status: Home / Self Care | Payer: Medicare Other | Source: Ambulatory Visit | Attending: Family Medicine | Admitting: Family Medicine

## 2023-10-15 DIAGNOSIS — Z1231 Encounter for screening mammogram for malignant neoplasm of breast: Secondary | ICD-10-CM

## 2023-10-31 ENCOUNTER — Encounter: Payer: Self-pay | Admitting: Adult Health

## 2023-10-31 ENCOUNTER — Telehealth (INDEPENDENT_AMBULATORY_CARE_PROVIDER_SITE_OTHER): Payer: Medicare Other | Admitting: Adult Health

## 2023-10-31 DIAGNOSIS — G4700 Insomnia, unspecified: Secondary | ICD-10-CM

## 2023-10-31 DIAGNOSIS — F339 Major depressive disorder, recurrent, unspecified: Secondary | ICD-10-CM | POA: Diagnosis not present

## 2023-10-31 DIAGNOSIS — G4730 Sleep apnea, unspecified: Secondary | ICD-10-CM | POA: Diagnosis not present

## 2023-10-31 DIAGNOSIS — F411 Generalized anxiety disorder: Secondary | ICD-10-CM

## 2023-10-31 MED ORDER — KLONOPIN 1 MG PO TABS: ORAL_TABLET | ORAL | 2 refills | Status: DC

## 2023-10-31 MED ORDER — FLUOXETINE HCL 40 MG PO CAPS: 40.0000 mg | ORAL_CAPSULE | Freq: Every day | ORAL | 2 refills | Status: DC

## 2023-10-31 MED ORDER — ARMODAFINIL 250 MG PO TABS: ORAL_TABLET | ORAL | 2 refills | Status: DC

## 2023-10-31 MED ORDER — OLANZAPINE 10 MG PO TABS: 10.0000 mg | ORAL_TABLET | Freq: Every day | ORAL | 2 refills | Status: DC
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
## 2023-12-03 ENCOUNTER — Telehealth: Payer: Medicare Other | Admitting: Adult Health

## 2023-12-03 ENCOUNTER — Encounter: Payer: Self-pay | Admitting: Adult Health

## 2023-12-03 DIAGNOSIS — G4700 Insomnia, unspecified: Secondary | ICD-10-CM

## 2023-12-03 DIAGNOSIS — F339 Major depressive disorder, recurrent, unspecified: Secondary | ICD-10-CM | POA: Diagnosis not present

## 2023-12-03 DIAGNOSIS — G4730 Sleep apnea, unspecified: Secondary | ICD-10-CM

## 2023-12-03 DIAGNOSIS — F411 Generalized anxiety disorder: Secondary | ICD-10-CM | POA: Diagnosis not present

## 2023-12-03 MED ORDER — DESVENLAFAXINE SUCCINATE ER 50 MG PO TB24: 50.0000 mg | ORAL_TABLET | Freq: Every day | ORAL | 2 refills | Status: DC
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
## 2023-12-06 ENCOUNTER — Telehealth: Payer: Self-pay | Admitting: Adult Health

## 2023-12-06 MED ORDER — OLANZAPINE 5 MG PO TABS: 5.0000 mg | ORAL_TABLET | Freq: Every day | ORAL | 0 refills | Status: DC

## 2023-12-06 NOTE — Telephone Encounter (Signed)
 Rodneisha called and said that she is taking Olanzapine and went from 5 mg to 10 mg. She states she has a pill cutter but it doesn't cut her pills small enough. She is requesting if she can have Olanzapine 5 mg? Her phone number is    Phone  [phone] (H) [phone] [facility])      [facility] [phone] Ginette Otto, [place] - [address] AT [facility] OF [address] & [address] [phone]

## 2023-12-06 NOTE — Telephone Encounter (Signed)
 Rx for 5 mg olanzapine sent.

## 2023-12-19 ENCOUNTER — Telehealth: Payer: Self-pay | Admitting: Adult Health

## 2023-12-19 NOTE — Telephone Encounter (Signed)
 Per Almira Coaster - pt should stop the Pristiq. She has FU 4/1 and it can be discussed then what to do next.

## 2023-12-19 NOTE — Telephone Encounter (Signed)
 Olivia [name] called at 1:50 to report that the new medication does not work at all.  She has a rash and it causes constipation.  She is very anxious with all of this (she didn't know tht name of the medication).  Please call to discuss.  Nest appt 4/1

## 2023-12-31 ENCOUNTER — Telehealth: Admitting: Adult Health

## 2023-12-31 ENCOUNTER — Encounter: Payer: Self-pay | Admitting: Adult Health

## 2023-12-31 DIAGNOSIS — G4700 Insomnia, unspecified: Secondary | ICD-10-CM | POA: Diagnosis not present

## 2023-12-31 DIAGNOSIS — G4730 Sleep apnea, unspecified: Secondary | ICD-10-CM

## 2023-12-31 DIAGNOSIS — F339 Major depressive disorder, recurrent, unspecified: Secondary | ICD-10-CM

## 2023-12-31 DIAGNOSIS — F419 Anxiety disorder, unspecified: Secondary | ICD-10-CM | POA: Diagnosis not present

## 2023-12-31 DIAGNOSIS — F411 Generalized anxiety disorder: Secondary | ICD-10-CM

## 2023-12-31 MED ORDER — FLUOXETINE HCL 20 MG PO CAPS: 20.0000 mg | ORAL_CAPSULE | Freq: Every day | ORAL | 2 refills | Status: DC
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
## 2024-01-02 ENCOUNTER — Telehealth: Payer: Self-pay | Admitting: Adult Health

## 2024-01-02 DIAGNOSIS — F339 Major depressive disorder, recurrent, unspecified: Secondary | ICD-10-CM

## 2024-01-02 MED ORDER — FLUOXETINE HCL 20 MG PO CAPS: 20.0000 mg | ORAL_CAPSULE | Freq: Every day | ORAL | 2 refills | Status: DC

## 2024-01-02 NOTE — Telephone Encounter (Signed)
 Resent Rx with a notation that she takes 2 different doses - a 40 and 20 to = 60

## 2024-01-02 NOTE — Telephone Encounter (Signed)
 Patient lvm requesting Rx sent again. She stated there was some confusion when her son went to pick it up, and that the pharmacy ask that we re-send it.

## 2024-01-02 NOTE — Telephone Encounter (Signed)
 Olivia [name] called back at 1:34 sand states that the medication is the fluoxetine. 20mg   He son went to pick up prescription and they questioned him about the prescription and then cancelled the prescription.  Please call the pharmacy to explain why she ahd the 40mg  and the 20mg .  That seems to be the confusion.  Anyway, she no longer has a prescription. [id number] - [place], [place] - [address] AT [facility] OF [address] & [address]

## 2024-01-02 NOTE — Telephone Encounter (Signed)
 LVM to [facility] - what is the medication?

## 2024-01-13 ENCOUNTER — Other Ambulatory Visit: Payer: Self-pay | Admitting: Adult Health

## 2024-01-22 ENCOUNTER — Telehealth: Payer: Self-pay

## 2024-01-22 ENCOUNTER — Telehealth: Admitting: Adult Health

## 2024-01-22 ENCOUNTER — Encounter: Payer: Self-pay | Admitting: Adult Health

## 2024-01-22 DIAGNOSIS — F32A Depression, unspecified: Secondary | ICD-10-CM | POA: Diagnosis not present

## 2024-01-22 DIAGNOSIS — G4700 Insomnia, unspecified: Secondary | ICD-10-CM

## 2024-01-22 DIAGNOSIS — F419 Anxiety disorder, unspecified: Secondary | ICD-10-CM | POA: Diagnosis not present

## 2024-01-22 DIAGNOSIS — F411 Generalized anxiety disorder: Secondary | ICD-10-CM

## 2024-01-22 DIAGNOSIS — G4730 Sleep apnea, unspecified: Secondary | ICD-10-CM

## 2024-01-22 DIAGNOSIS — F339 Major depressive disorder, recurrent, unspecified: Secondary | ICD-10-CM

## 2024-01-22 MED ORDER — RISPERIDONE 0.5 MG PO TABS: 0.5000 mg | ORAL_TABLET | Freq: Every day | ORAL | 0 refills | Status: DC

## 2024-01-22 NOTE — Telephone Encounter (Signed)
 [facility] results  Green bupropion (Wellbutrin ) clomipramine (Anafranil ) desipramine (Norpramin ) desvenlafaxine  (Pristiq  ) fluoxetine  (Prozac  ) levomilnacipran (Fetzima ) nortriptyline (Pamelor ) paroxetine (Paxil ) trazodone (Desyrel ) venlafaxine (Effexor ) vilazodone (Viibryd ) vortioxetine (Trintellix ) duloxetine  (Cymbalta  ) 7 fluvoxamine (Luvox ) 7 mirtazapine  (Remeron   Anxiolytics alprazolam (Xanax ) buspirone  (BuSpar  ) chlordiazepoxide (Librium ) clonazepam  (Klonopin  ) clorazepate (Tranxene ) diazepam (Valium ) eszopiclone (Lunesta ) lemborexant (Dayvigo ) lorazepam (Ativan ) oxazepam (Serax ) suvorexant (Belsomra ) temazepam (Restoril ) zolpidem  (Ambien  ) propranolol (Inderal  Antipsychotics aripiprazole  (Abilify  ) brexpiprazole  (Rexulti  ) cariprazine  (Vraylar  ) fluphenazine (Prolixin ) iloperidone (Fanapt ) lumateperone (Caplyta ) lurasidone (Latuda ) paliperidone (Invega ) perphenazine (Trilafon ) quetiapine (Seroquel ) risperidone  (Risperdal  ) ziprasidone (Geodon ) asenapine (Saphris ) 7 chlorpromazine (Thorazine ) 7 clozapine (Clozaril ) 7 haloperidol (Haldol ) 7 olanzapine  (Zyprexa  ) 7 thioridazine (Mellaril ) 7 thiothixene (Navane )  Mood stabilizers carbamazepine (Tegretol ) lamotrigine (Lamictal ) oxcarbazepine (Trileptal ) valproic acid/divalproex
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
## 2024-01-24 ENCOUNTER — Telehealth: Payer: Self-pay | Admitting: Adult Health

## 2024-01-24 NOTE — Telephone Encounter (Signed)
 Please see message and advise as needed.

## 2024-01-24 NOTE — Telephone Encounter (Signed)
 Pt called and said that she took the respiridone for 2 nights. She said that she is some better but still anxious. Stomach is better. She will continue with medicine but wanted Bonnell Butcher to know and if anything has to be increased or changed please call her at [phone]

## 2024-01-24 NOTE — Telephone Encounter (Signed)
 LVM per DPR asking her to update us  on Monday.

## 2024-01-27 ENCOUNTER — Telehealth: Payer: Self-pay | Admitting: Adult Health

## 2024-01-27 NOTE — Telephone Encounter (Signed)
 LF 4/3, due 5/1

## 2024-01-27 NOTE — Telephone Encounter (Signed)
 Pt called asking for a refill on her armodafinil  250 mg. Pharmacy is [facility]  on [address]

## 2024-01-28 ENCOUNTER — Telehealth: Payer: Self-pay | Admitting: Adult Health

## 2024-01-28 NOTE — Telephone Encounter (Signed)
 Pt lvm that she also needs her klonopin  refilled. She also wants to let gina know that she has been on the respiridone for a week. It has helped with her nausea but she is still not sleeping well. She is still waking in a panic with thoughts. Please call her and let her know if another medicine can be addded. Please call her at [phone]

## 2024-01-29 NOTE — Telephone Encounter (Signed)
 She is taking risperidone . Reports it helps with nausea but not sleep or the panic. She said you want her to relax and she isn't thus far.

## 2024-01-29 NOTE — Telephone Encounter (Signed)
 Patient reporting she has trouble getting to sleep and staying asleep. Reports waking up with panic during the night. She had been on olanzapine  10 mg and then it was changed to 5 mg. She reported the olanzapine  was helpful, but then it stopped working. Last note mentions trying Buspar .

## 2024-01-29 NOTE — Telephone Encounter (Signed)
 Klonopin  last filled 4/6, due 5/4

## 2024-01-29 NOTE — Telephone Encounter (Signed)
 LVM per DPR that clonazepam  was not due until 5/4. Asked her to call back about the rest of the message so that I could ask questions.

## 2024-01-30 ENCOUNTER — Other Ambulatory Visit: Payer: Self-pay

## 2024-01-30 DIAGNOSIS — G4730 Sleep apnea, unspecified: Secondary | ICD-10-CM

## 2024-01-30 MED ORDER — ARMODAFINIL 250 MG PO TABS: ORAL_TABLET | ORAL | 0 refills | Status: DC

## 2024-01-30 NOTE — Telephone Encounter (Signed)
 Patient notified to take two of the 0.5 mg Risperidone . Asked her to call next Wednesday and let us  know how she was doing and could send in new script at that time if appropriate.

## 2024-01-30 NOTE — Telephone Encounter (Signed)
 Pended armodafinil  to [facility] on [address]

## 2024-01-31 ENCOUNTER — Other Ambulatory Visit: Payer: Self-pay

## 2024-01-31 DIAGNOSIS — F411 Generalized anxiety disorder: Secondary | ICD-10-CM

## 2024-01-31 MED ORDER — KLONOPIN 1 MG PO TABS: ORAL_TABLET | ORAL | 0 refills | Status: DC

## 2024-01-31 NOTE — Telephone Encounter (Signed)
 Pended Klonopin  for fill 5/4

## 2024-02-05 ENCOUNTER — Telehealth: Payer: Self-pay | Admitting: Adult Health

## 2024-02-05 MED ORDER — RISPERIDONE 1 MG PO TABS: 1.0000 mg | ORAL_TABLET | Freq: Every day | ORAL | 0 refills | Status: DC

## 2024-02-05 NOTE — Telephone Encounter (Signed)
 Patient increased to 1 mg on 01/30/24. Rx sent for 1 mg, 30-day supply.

## 2024-02-05 NOTE — Telephone Encounter (Signed)
 Pt called and said that the resperidone  0.5 mg is working. She would like a refill sent to the [facility] on [address]

## 2024-02-20 ENCOUNTER — Telehealth (INDEPENDENT_AMBULATORY_CARE_PROVIDER_SITE_OTHER): Admitting: Adult Health

## 2024-02-20 ENCOUNTER — Encounter: Payer: Self-pay | Admitting: Adult Health

## 2024-02-20 DIAGNOSIS — F419 Anxiety disorder, unspecified: Secondary | ICD-10-CM

## 2024-02-20 DIAGNOSIS — F32A Depression, unspecified: Secondary | ICD-10-CM

## 2024-02-20 DIAGNOSIS — F339 Major depressive disorder, recurrent, unspecified: Secondary | ICD-10-CM

## 2024-02-20 DIAGNOSIS — G4700 Insomnia, unspecified: Secondary | ICD-10-CM | POA: Diagnosis not present

## 2024-02-20 DIAGNOSIS — G4730 Sleep apnea, unspecified: Secondary | ICD-10-CM | POA: Diagnosis not present

## 2024-02-20 DIAGNOSIS — F411 Generalized anxiety disorder: Secondary | ICD-10-CM

## 2024-02-20 MED ORDER — RISPERIDONE 1 MG PO TABS: 1.0000 mg | ORAL_TABLET | Freq: Every day | ORAL | 2 refills | Status: DC

## 2024-02-20 MED ORDER — BUSPIRONE HCL 10 MG PO TABS: 10.0000 mg | ORAL_TABLET | Freq: Three times a day (TID) | ORAL | 2 refills | Status: DC

## 2024-02-20 MED ORDER — ARMODAFINIL 250 MG PO TABS: ORAL_TABLET | ORAL | 2 refills | Status: DC

## 2024-02-20 MED ORDER — FLUOXETINE HCL 40 MG PO CAPS: 40.0000 mg | ORAL_CAPSULE | Freq: Every day | ORAL | 2 refills | Status: DC

## 2024-02-20 MED ORDER — KLONOPIN 1 MG PO TABS: ORAL_TABLET | ORAL | 2 refills | Status: DC
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
## 2024-02-27 ENCOUNTER — Other Ambulatory Visit: Payer: Self-pay | Admitting: Adult Health

## 2024-02-27 DIAGNOSIS — F339 Major depressive disorder, recurrent, unspecified: Secondary | ICD-10-CM

## 2024-03-12 ENCOUNTER — Telehealth: Admitting: Adult Health

## 2024-03-27 ENCOUNTER — Telehealth (INDEPENDENT_AMBULATORY_CARE_PROVIDER_SITE_OTHER): Admitting: Adult Health

## 2024-03-27 ENCOUNTER — Encounter: Payer: Self-pay | Admitting: Adult Health

## 2024-03-27 DIAGNOSIS — F339 Major depressive disorder, recurrent, unspecified: Secondary | ICD-10-CM

## 2024-03-27 DIAGNOSIS — G4730 Sleep apnea, unspecified: Secondary | ICD-10-CM

## 2024-03-27 DIAGNOSIS — G4700 Insomnia, unspecified: Secondary | ICD-10-CM

## 2024-03-27 DIAGNOSIS — F411 Generalized anxiety disorder: Secondary | ICD-10-CM

## 2024-03-27 DIAGNOSIS — F419 Anxiety disorder, unspecified: Secondary | ICD-10-CM | POA: Diagnosis not present

## 2024-03-27 DIAGNOSIS — F32A Depression, unspecified: Secondary | ICD-10-CM | POA: Diagnosis not present

## 2024-03-27 MED ORDER — BUSPIRONE HCL 15 MG PO TABS: 15.0000 mg | ORAL_TABLET | Freq: Two times a day (BID) | ORAL | 2 refills | Status: DC

## 2024-03-27 MED ORDER — RISPERIDONE 1 MG PO TABS: 1.0000 mg | ORAL_TABLET | Freq: Every day | ORAL | 2 refills | Status: DC

## 2024-03-27 MED ORDER — FLUOXETINE HCL 20 MG PO CAPS: 20.0000 mg | ORAL_CAPSULE | Freq: Every day | ORAL | 2 refills | Status: DC

## 2024-03-27 MED ORDER — KLONOPIN 1 MG PO TABS: ORAL_TABLET | ORAL | 2 refills | Status: DC

## 2024-03-27 MED ORDER — FLUOXETINE HCL 40 MG PO CAPS: 40.0000 mg | ORAL_CAPSULE | Freq: Every day | ORAL | 2 refills | Status: DC
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
## 2024-04-07 ENCOUNTER — Other Ambulatory Visit: Payer: Self-pay | Admitting: Adult Health

## 2024-04-07 DIAGNOSIS — F339 Major depressive disorder, recurrent, unspecified: Secondary | ICD-10-CM

## 2024-04-08 NOTE — Telephone Encounter (Signed)
 Need to verify if she is still taking. Is on the med list, but not in note. Last prescribed in March for 30 days with 2 RF.

## 2024-04-24 ENCOUNTER — Encounter: Payer: Self-pay | Admitting: Adult Health

## 2024-04-24 ENCOUNTER — Telehealth: Admitting: Adult Health

## 2024-04-24 DIAGNOSIS — F411 Generalized anxiety disorder: Secondary | ICD-10-CM | POA: Diagnosis not present

## 2024-04-24 DIAGNOSIS — G4700 Insomnia, unspecified: Secondary | ICD-10-CM | POA: Diagnosis not present

## 2024-04-24 DIAGNOSIS — G4730 Sleep apnea, unspecified: Secondary | ICD-10-CM

## 2024-04-24 DIAGNOSIS — F339 Major depressive disorder, recurrent, unspecified: Secondary | ICD-10-CM
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
## 2024-05-25 ENCOUNTER — Encounter: Payer: Self-pay | Admitting: Adult Health

## 2024-05-25 ENCOUNTER — Telehealth (INDEPENDENT_AMBULATORY_CARE_PROVIDER_SITE_OTHER): Admitting: Adult Health

## 2024-05-25 DIAGNOSIS — G4700 Insomnia, unspecified: Secondary | ICD-10-CM

## 2024-05-25 DIAGNOSIS — F419 Anxiety disorder, unspecified: Secondary | ICD-10-CM

## 2024-05-25 DIAGNOSIS — F339 Major depressive disorder, recurrent, unspecified: Secondary | ICD-10-CM

## 2024-05-25 DIAGNOSIS — F32A Depression, unspecified: Secondary | ICD-10-CM

## 2024-05-25 DIAGNOSIS — F411 Generalized anxiety disorder: Secondary | ICD-10-CM

## 2024-05-25 DIAGNOSIS — G4730 Sleep apnea, unspecified: Secondary | ICD-10-CM | POA: Diagnosis not present

## 2024-05-25 MED ORDER — KLONOPIN 1 MG PO TABS
ORAL_TABLET | ORAL | 2 refills | Status: DC
Start: 2024-05-25 — End: 2024-08-10

## 2024-05-25 MED ORDER — RISPERIDONE 0.5 MG PO TABS: 0.5000 mg | ORAL_TABLET | Freq: Every day | ORAL | 2 refills | Status: DC

## 2024-05-25 MED ORDER — RISPERIDONE 1 MG PO TABS: 1.0000 mg | ORAL_TABLET | Freq: Every day | ORAL | 2 refills | Status: DC

## 2024-05-25 MED ORDER — FLUOXETINE HCL 40 MG PO CAPS
40.0000 mg | ORAL_CAPSULE | Freq: Every day | ORAL | 2 refills | Status: DC
Start: 2024-05-25 — End: 2024-08-04

## 2024-05-25 MED ORDER — ARMODAFINIL 250 MG PO TABS: ORAL_TABLET | ORAL | 2 refills | Status: DC
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
## 2024-06-23 ENCOUNTER — Encounter: Payer: Self-pay | Admitting: Adult Health

## 2024-06-23 ENCOUNTER — Telehealth: Admitting: Adult Health

## 2024-06-23 DIAGNOSIS — G4730 Sleep apnea, unspecified: Secondary | ICD-10-CM | POA: Diagnosis not present

## 2024-06-23 DIAGNOSIS — F411 Generalized anxiety disorder: Secondary | ICD-10-CM

## 2024-06-23 DIAGNOSIS — F32A Depression, unspecified: Secondary | ICD-10-CM | POA: Diagnosis not present

## 2024-06-23 DIAGNOSIS — F419 Anxiety disorder, unspecified: Secondary | ICD-10-CM

## 2024-06-23 DIAGNOSIS — F339 Major depressive disorder, recurrent, unspecified: Secondary | ICD-10-CM

## 2024-06-23 DIAGNOSIS — G4700 Insomnia, unspecified: Secondary | ICD-10-CM

## 2024-06-23 MED ORDER — DULOXETINE HCL 30 MG PO CPEP: 30.0000 mg | ORAL_CAPSULE | Freq: Every day | ORAL | 2 refills | Status: DC
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
## 2024-07-13 ENCOUNTER — Encounter: Payer: Self-pay | Admitting: Adult Health

## 2024-07-13 ENCOUNTER — Ambulatory Visit (INDEPENDENT_AMBULATORY_CARE_PROVIDER_SITE_OTHER): Admitting: Adult Health

## 2024-07-13 DIAGNOSIS — G4700 Insomnia, unspecified: Secondary | ICD-10-CM | POA: Diagnosis not present

## 2024-07-13 DIAGNOSIS — G4730 Sleep apnea, unspecified: Secondary | ICD-10-CM | POA: Diagnosis not present

## 2024-07-13 DIAGNOSIS — F339 Major depressive disorder, recurrent, unspecified: Secondary | ICD-10-CM | POA: Diagnosis not present

## 2024-07-13 DIAGNOSIS — F411 Generalized anxiety disorder: Secondary | ICD-10-CM | POA: Diagnosis not present

## 2024-07-13 MED ORDER — DULOXETINE HCL 60 MG PO CPEP
60.0000 mg | ORAL_CAPSULE | Freq: Every day | ORAL | 2 refills | Status: AC
Start: 2024-07-13 — End: ?

## 2024-07-13 NOTE — Progress Notes (Signed)
 Olivia [name] [id number] 1951/03/17 73 y.o.  Subjective:   Patient ID:  Olivia [name] is a 73 y.o. (DOB 10-25-1950) female.  Chief Complaint: No chief complaint on file.   HPI Olivia [name] presents to the office today for follow-up of  sleep apnea, depression, anxiety and insomnia.  Describes mood today as not any better. Pleasant. Reports tearfulness. Mood symptoms - reports anxiety, depression and irritability Denies panic attacks. Reports having interest, but lacks motivation. Denies rumination. Reports some worry and over thinking. Denies obsessive thoughts - not generally. Reports mood is lower. Stating I don't feel like I'm doing any better. Taking medications as prescribed. Energy levels lower. Active, does not have a regular exercise routine. Is able to enjoy usual interests and activities. Single. Lives with son. Talking to with friends.  Appetite adequate. Weight stable. Reports sleep has improved. Averages 8 to 9 hours. Using CPAP. Denies daytime napping. Reports focus and concentration it's not great. Completing minimal tasks. Managing some aspects of household. Retired. Denies SI or HI.  Denies AH or VH.  Denies self harm. Denies substance use.   Previous medications: Wellbutrin - bad reaction, Pristiq , Rexulti , Vraylar , Abilify , Zyprexa , Prozac , Buspar .  Review of Systems:  Review of Systems  Musculoskeletal:  Negative for gait problem.  Neurological:  Negative for tremors.  Psychiatric/Behavioral:         Please refer to HPI    Medications: I have reviewed the patient's current medications.  Current Outpatient Medications  Medication Sig Dispense Refill   acetaminophen  (TYLENOL ) 500 MG tablet Take 2 tablets (1,000 mg total) by mouth every 6 (six) hours as needed for mild pain or moderate pain. 30 tablet 0   amLODipine  (NORVASC ) 5 MG tablet TAKE 1 TABLET(5 MG) BY MOUTH DAILY 30 tablet 5   Armodafinil  250 MG tablet TAKE 1 TABLET(250 MG) BY MOUTH DAILY 30 tablet  2   aspirin  EC 325 MG EC tablet Take 1 tablet (325 mg total) by mouth 2 (two) times daily. (Patient not taking: Reported on 01/04/2021) 30 tablet 0   atenolol  (TENORMIN ) 25 MG tablet Take 25 mg by mouth at bedtime.      azelastine  (ASTELIN ) 0.1 % nasal spray Place 2 sprays into both nostrils daily as needed for rhinitis. Use in each nostril as directed 30 mL 3   azithromycin  (ZITHROMAX ) 250 MG tablet Take 2 tablets today then 1 tablet daily until gone 6 tablet 0   benzonatate  (TESSALON ) 100 MG capsule TAKE 1 CAPSULE(100 MG) BY MOUTH TWICE DAILY AS NEEDED FOR COUGH 60 capsule 2   CALCIUM -MAGNESIUM-VITAMIN D  PO Take 1 tablet by mouth daily.     cetirizine  (ZYRTEC ) 10 MG tablet TAKE 1 TABLET(10 MG) BY MOUTH DAILY 90 tablet 0   DULoxetine  (CYMBALTA ) 30 MG capsule Take 1 capsule (30 mg total) by mouth daily. 30 capsule 2   esomeprazole (NEXIUM) 40 MG capsule Take 40 mg by mouth 2 (two) times daily before a meal.      FLUoxetine  (PROZAC ) 40 MG capsule Take 1 capsule (40 mg total) by mouth daily. 30 capsule 2   fluticasone  (FLONASE ) 50 MCG/ACT nasal spray SHAKE LIQUID AND USE 1 SPRAY IN EACH NOSTRIL DAILY 16 g 8   hydrOXYzine  (ATARAX ) 25 MG tablet Take 1 tablet (25 mg total) by mouth 3 (three) times daily as needed. 90 tablet 2   KLONOPIN  1 MG tablet TAKE 1 TABLET(1 MG) BY MOUTH FOUR TIMES DAILY AS NEEDED FOR ANXIETY 120 tablet 2   lisinopril  (  PRINIVIL ,ZESTRIL ) 20 MG tablet Take 20 mg by mouth daily.  (Patient not taking: Reported on 01/04/2021)  1   methocarbamol  (ROBAXIN ) 500 MG tablet Take 1-2 tablets (500-1,000 mg total) by mouth every 6 (six) hours as needed for muscle spasms. 60 tablet 0   Multiple Vitamin (MULITIVITAMIN WITH MINERALS) TABS Take 1 tablet by mouth daily.     predniSONE  (DELTASONE ) 20 MG tablet Take 40 mg daily x 5 days 10 tablet 0   risperiDONE  (RISPERDAL ) 0.5 MG tablet Take 1 tablet (0.5 mg total) by mouth at bedtime. 30 tablet 2   risperiDONE  (RISPERDAL ) 1 MG tablet Take 1 tablet (1  mg total) by mouth at bedtime. 30 tablet 2   sodium chloride  (OCEAN) 0.65 % SOLN nasal spray Place 2 sprays into both nostrils as needed for congestion. 30 mL 2   SYNTHROID  125 MCG tablet Take 125 mcg by mouth at bedtime.      vitamin C  (ASCORBIC ACID ) 500 MG tablet Take 500 mg by mouth 2 (two) times a week.      No current facility-administered medications for this visit.    Medication Side Effects: None  Allergies:  Allergies  Allergen Reactions   Abilify  [Aripiprazole ] Hives   Demerol  [Meperidine ] Nausea And Vomiting   Other     Other reaction(s): hives   Penicillins Other (See Comments)    Unknown childhood allergy  Has patient had a PCN reaction causing immediate rash, facial/tongue/throat swelling, SOB or lightheadedness with hypotension: Unknown  Has patient had a PCN reaction causing severe rash involving mucus membranes or skin necrosis: Unknown  Has patient had a PCN reaction that required hospitalization: Unknown  Has patient had a PCN reaction occurring within the last 10 years: No  If all of the above answers are NO, then may proceed with Cephalosporin use.  Other Reaction(s): Childhood Allergy, Other (See Comments)  Unknown childhood allergy, Has patient had a PCN reaction causing immediate rash, facial/tongue/throat swelling, SOB or lightheadedness with hypotension: Unknown, Has patient had a PCN reaction causing severe rash involving mucus membranes or skin necrosis: Unknown, Has patient had a PCN reaction that required hospitalization: Unknown, Has patient had a PCN reaction occurring within the last 10 years: No, If all of the above answers are NO, then may proceed with Cephalosporin use.    Past Medical History:  Diagnosis Date   Anxiety    [facility]   Arthritis    Cataract    THESE BEEN REPLACED   Depression    [facility]   Diverticulosis    Dry eye syndrome    GERD (gastroesophageal reflux disease)    History of colon  polyps 12/03/2012   COLONOSCOPY   History of exercise stress test 2010   Dr. IVAR Bihari    Hyperlipidemia    Hypertension    Hypothyroidism    Multinodular goiter    DR. KERR   OSA (obstructive sleep apnea)    (PSG 12/12/15 ESS 2, AHI 42/HR REM 30/HR, O2 MIN 80%)  CPAP- q night , done with [facility]  study done at [facility]   Squamous cell carcinoma in situ 2015   Thyroid  disease    Nodules   Tubular adenoma    DR. SCHOOLER   Varicose veins     Past Medical History, Surgical history, Social history, and Family history were reviewed and updated as appropriate.   Please see review of systems for further details on the patient's review from today.   Objective:  Physical Exam:  There were no vitals taken for this visit.  Physical Exam Constitutional:      General: She is not in acute distress. Musculoskeletal:        General: No deformity.  Neurological:     Mental Status: She is alert and oriented to person, place, and time.     Coordination: Coordination normal.  Psychiatric:        Attention and Perception: Attention and perception normal. She does not perceive auditory or visual hallucinations.        Mood and Affect: Mood normal. Mood is not anxious or depressed. Affect is not labile, blunt, angry or inappropriate.        Speech: Speech normal.        Behavior: Behavior normal.        Thought Content: Thought content normal. Thought content is not paranoid or delusional. Thought content does not include homicidal or suicidal ideation. Thought content does not include homicidal or suicidal plan.        Cognition and Memory: Cognition and memory normal.        Judgment: Judgment normal.     Comments: Insight intact     Lab Review:     Component Value Date/Time   NA 130 (L) 12/01/2019 0321   K 4.2 12/01/2019 0321   CL 98 12/01/2019 0321   CO2 23 12/01/2019 0321   GLUCOSE 94 12/01/2019 0321   BUN 14 12/01/2019 0321   CREATININE 0.84 12/01/2019 0321   CALCIUM   8.3 (L) 12/01/2019 0321   PROT 7.4 11/24/2019 1450   ALBUMIN 4.0 11/24/2019 1450   AST 17 11/24/2019 1450   ALT 18 11/24/2019 1450   ALKPHOS 82 11/24/2019 1450   BILITOT 0.4 11/24/2019 1450   GFRNONAA >60 12/01/2019 0321   GFRAA >60 12/01/2019 0321       Component Value Date/Time   WBC 7.4 12/01/2019 0321   RBC 3.29 (L) 12/01/2019 0321   HGB 10.0 (L) 12/01/2019 0321   HCT 31.0 (L) 12/01/2019 0321   PLT 244 12/01/2019 0321   MCV 94.2 12/01/2019 0321   MCH 30.4 12/01/2019 0321   MCHC 32.3 12/01/2019 0321   RDW 15.8 (H) 12/01/2019 0321   LYMPHSABS 1.9 11/24/2019 1450   MONOABS 0.6 11/24/2019 1450   EOSABS 0.2 11/24/2019 1450   BASOSABS 0.0 11/24/2019 1450    No results found for: POCLITH, LITHIUM   No results found for: PHENYTOIN, PHENOBARB, VALPROATE, CBMZ   .res Assessment: Plan:    Plan:  Discussed TRD depression - plans to consider Spravato and therapy as other mental health treatment options.  Increase Risperdal  1mg  to 1.5mg  from 1mg  at hs Increase Cymbalta  30mg  to 60mg  every morning for depression - green/genesight  Continue Prozac  40mg  daily  Continue Clonazepam  1mg  four times a day  Nuvigil  250mg  tablet every morning for sleep apnea  RTC 4 weeks   25 minutes spent dedicated to the care of this patient on the date of this encounter to include pre-visit review of records, ordering of medication, post visit documentation, and face-to-face time with the patient discussing sleep apnea, depression, anxiety and insomnia. Discussed discontinuing the Buspar  for anxiety. Will consider other options at next appointment.   Patient advised to contact office with any questions, adverse effects, or acute worsening in signs and symptoms.  Discussed potential benefits, risk, and side effects of benzodiazepines to include potential risk of tolerance and dependence, as well as possible drowsiness. Advised patient not to drive if  experiencing drowsiness and to take  lowest possible effective dose to minimize risk of dependence and tolerance.  There are no diagnoses linked to this encounter.   Please see After Visit Summary for patient specific instructions.  Future Appointments  Date Time Provider Department Center  07/13/2024  9:30 AM Nohely Whitehorn Nattalie, NP [facility] None    No orders of the defined types were placed in this encounter.   -------------------------------

## 2024-08-03 ENCOUNTER — Ambulatory Visit: Admitting: Mental Health

## 2024-08-03 DIAGNOSIS — F411 Generalized anxiety disorder: Secondary | ICD-10-CM

## 2024-08-03 DIAGNOSIS — F339 Major depressive disorder, recurrent, unspecified: Secondary | ICD-10-CM

## 2024-08-03 NOTE — Progress Notes (Signed)
 [facility] Initial Adult Exam  Name: Olivia [name] Date: 08/03/2024 MRN: [id number] DOB: 08-21-1951 PCP: Claudene Pellet, MD  Time spent: 49 minutes  Reason for Visit [place] Problem: Malaina reports she has been coping with depression for many years, began in early 46's, at that time situational. She is in care with Angeline Sayers, NP. About a year ago, she went off her Prozac  per her PCP for other medical reasons, which increased her depression severely. She started back on her medications with some effectiveness. Feels frozen , low motivation, not active. She has 2 adults sons, she lives with the younger son. She use to read at a coffee shop but its been about 6 months and now feels self conscious about see people she knows there. She wants to be able to get up in the morning an not feel sad, disappointment in herself. She feels she could cook more but lacks motivation.  She stated that she has set boundaries with her older son, reports about 10 years ago she had to implement this in their relationship as her stress levels were significant.  She stated now, her son knows not to engage with her about his issues as they can be upsetting.  She stated that he utilizes his brother as a support.  She reports also coping with anxiety most days, feels her depression has been primary over the last year particularly. Recommended she continue med management with Regina Mozingo, NP and return to therapy in 2 weeks.    Mental Status Exam:    Appearance:    Casual     Behavior:   Appropriate  Motor:   WNL  Speech/Language:    Clear and Coherent  Affect:   Full range   Mood:   Euthymic  Thought process:   Logical, linear, goal directed  Thought content:     WNL  Sensory/Perceptual disturbances:     none  Orientation:   x4  Attention:   Good  Concentration:   Good  Memory:   Intact  Fund of knowledge:    Consistent with age and development  Insight:     Good  Judgment:    Good  Impulse  Control:   Good     Reported Symptoms:     Risk Assessment: Danger to Self:  No Self-injurious Behavior: No Danger to Others: No Duty to Warn:no Physical Aggression / Violence:No  Access to Firearms a concern: No  Gang Involvement:No  Patient / guardian was educated about steps to take if suicide or homicide risk level increases between visits: yes While future psychiatric events cannot be accurately predicted, the patient does not currently require acute inpatient psychiatric care and does not currently meet   involuntary commitment criteria.   Substance Abuse History: Current substance abuse: No     Past Psychiatric History:   Previous psychological history is significant for anxiety and depression Outpatient Providers: Angeline Sayers, NP History of Psych Hospitalization: No  Psychological Testing: none    Family History:  Family History  Problem Relation Age of Onset   Dementia Mother 29   Hypertension Father    Emphysema Father    Healthy Son    Emphysema Maternal Grandfather    Healthy Son       Medical History/Surgical History: Past Medical History:  Diagnosis Date   Anxiety    [facility]   Arthritis    Cataract    THESE BEEN REPLACED   Depression    [facility]  Diverticulosis    Dry eye syndrome    GERD (gastroesophageal reflux disease)    History of colon polyps 12/03/2012   COLONOSCOPY   History of exercise stress test 2010   Dr. IVAR Bihari    Hyperlipidemia    Hypertension    Hypothyroidism    Multinodular goiter    DR. KERR   OSA (obstructive sleep apnea)    (PSG 12/12/15 ESS 2, AHI 42/HR REM 30/HR, O2 MIN 80%)  CPAP- q night , done with [facility]  [facility] done at [facility]   Squamous cell carcinoma in situ 2015   Thyroid  disease    Nodules   Tubular adenoma    DR. SCHOOLER   Varicose veins     Past Surgical History:  Procedure Laterality Date   BREAST EXCISIONAL BIOPSY Left    benign    CHOLECYSTECTOMY  2003   Gall Bladder   COLONOSCOPY  06/25/2017   DR. [facility]   EYE SURGERY Right    Cataract   EYE SURGERY Left    Cataract   fibroid adenoma  1989 and 80008   SEPTOPLASTY     for deviated septum   SYNOVECTOMY WITH POLY EXCHANGE Right 11/30/2019   Procedure: SYNOVECTOMY WITH POLY EXCHANGE;  Surgeon: Rubie Kemps, MD;  Location: [facility];  Service: Orthopedics;  Laterality: Right;   TOTAL KNEE ARTHROPLASTY Right 11/22/2017   Procedure: RIGHT TOTAL KNEE ARTHROPLASTY;  Surgeon: Shari Sieving, MD;  [facility];  Service: Orthopedics;  Laterality: Right;    Medications: Current Outpatient Medications  Medication Sig Dispense Refill   acetaminophen  (TYLENOL ) 500 MG tablet Take 2 tablets (1,000 mg total) by mouth every 6 (six) hours as needed for mild pain or moderate pain. 30 tablet 0   amLODipine  (NORVASC ) 5 MG tablet TAKE 1 TABLET(5 MG) BY MOUTH DAILY 30 tablet 5   Armodafinil  250 MG tablet TAKE 1 TABLET(250 MG) BY MOUTH DAILY 30 tablet 2   aspirin  EC 325 MG EC tablet Take 1 tablet (325 mg total) by mouth 2 (two) times daily. (Patient not taking: Reported on 01/04/2021) 30 tablet 0   atenolol  (TENORMIN ) 25 MG tablet Take 25 mg by mouth at bedtime.      azelastine  (ASTELIN ) 0.1 % nasal spray Place 2 sprays into both nostrils daily as needed for rhinitis. Use in each nostril as directed 30 mL 3   azithromycin  (ZITHROMAX ) 250 MG tablet Take 2 tablets today then 1 tablet daily until gone 6 tablet 0   benzonatate  (TESSALON ) 100 MG capsule TAKE 1 CAPSULE(100 MG) BY MOUTH TWICE DAILY AS NEEDED FOR COUGH 60 capsule 2   CALCIUM -MAGNESIUM-VITAMIN D  PO Take 1 tablet by mouth daily.     cetirizine  (ZYRTEC ) 10 MG tablet TAKE 1 TABLET(10 MG) BY MOUTH DAILY 90 tablet 0   DULoxetine  (CYMBALTA ) 60 MG capsule Take 1 capsule (60 mg total) by mouth daily. 30 capsule 2   esomeprazole (NEXIUM) 40 MG capsule Take 40 mg by mouth 2 (two) times daily before a meal.      FLUoxetine  (PROZAC ) 40 MG  capsule Take 1 capsule (40 mg total) by mouth daily. 30 capsule 2   fluticasone  (FLONASE ) 50 MCG/ACT nasal spray SHAKE LIQUID AND USE 1 SPRAY IN EACH NOSTRIL DAILY 16 g 8   hydrOXYzine  (ATARAX ) 25 MG tablet Take 1 tablet (25 mg total) by mouth 3 (three) times daily as needed. 90 tablet 2   KLONOPIN  1 MG tablet TAKE 1 TABLET(1 MG) BY MOUTH FOUR TIMES  DAILY AS NEEDED FOR ANXIETY 120 tablet 2   lisinopril  (PRINIVIL ,ZESTRIL ) 20 MG tablet Take 20 mg by mouth daily.  (Patient not taking: Reported on 01/04/2021)  1   methocarbamol  (ROBAXIN ) 500 MG tablet Take 1-2 tablets (500-1,000 mg total) by mouth every 6 (six) hours as needed for muscle spasms. 60 tablet 0   Multiple Vitamin (MULITIVITAMIN WITH MINERALS) TABS Take 1 tablet by mouth daily.     predniSONE  (DELTASONE ) 20 MG tablet Take 40 mg daily x 5 days 10 tablet 0   risperiDONE  (RISPERDAL ) 0.5 MG tablet Take 1 tablet (0.5 mg total) by mouth at bedtime. 30 tablet 2   risperiDONE  (RISPERDAL ) 1 MG tablet Take 1 tablet (1 mg total) by mouth at bedtime. 30 tablet 2   sodium chloride  (OCEAN) 0.65 % SOLN nasal spray Place 2 sprays into both nostrils as needed for congestion. 30 mL 2   SYNTHROID  125 MCG tablet Take 125 mcg by mouth at bedtime.      vitamin C  (ASCORBIC ACID ) 500 MG tablet Take 500 mg by mouth 2 (two) times a week.      No current facility-administered medications for this visit.    Allergies  Allergen Reactions   Abilify  [Aripiprazole ] Hives   Demerol  [Meperidine ] Nausea And Vomiting   Other     Other reaction(s): hives   Penicillins Other (See Comments)    Unknown childhood allergy  Has patient had a PCN reaction causing immediate rash, facial/tongue/throat swelling, SOB or lightheadedness with hypotension: Unknown  Has patient had a PCN reaction causing severe rash involving mucus membranes or skin necrosis: Unknown  Has patient had a PCN reaction that required hospitalization: Unknown  Has patient had a PCN reaction occurring  within the last 10 years: No  If all of the above answers are NO, then may proceed with Cephalosporin use.  Other Reaction(s): Childhood Allergy, Other (See Comments)  Unknown childhood allergy, Has patient had a PCN reaction causing immediate rash, facial/tongue/throat swelling, SOB or lightheadedness with hypotension: Unknown, Has patient had a PCN reaction causing severe rash involving mucus membranes or skin necrosis: Unknown, Has patient had a PCN reaction that required hospitalization: Unknown, Has patient had a PCN reaction occurring within the last 10 years: No, If all of the above answers are NO, then may proceed with Cephalosporin use.    Diagnoses:    ICD-10-CM   1. Recurrent major depression resistant to treatment  F33.9     2. Generalized anxiety disorder  F41.1       Plan of Care: To be determined   Lonni Fischer, [facility]

## 2024-08-04 ENCOUNTER — Other Ambulatory Visit: Payer: Self-pay

## 2024-08-04 DIAGNOSIS — G4700 Insomnia, unspecified: Secondary | ICD-10-CM

## 2024-08-04 MED ORDER — FLUOXETINE HCL 40 MG PO CAPS
40.0000 mg | ORAL_CAPSULE | Freq: Every day | ORAL | 0 refills | Status: DC
Start: 2024-08-04 — End: 2024-08-10

## 2024-08-10 ENCOUNTER — Telehealth: Admitting: Adult Health

## 2024-08-10 ENCOUNTER — Encounter: Payer: Self-pay | Admitting: Adult Health

## 2024-08-10 DIAGNOSIS — F32A Depression, unspecified: Secondary | ICD-10-CM

## 2024-08-10 DIAGNOSIS — G4700 Insomnia, unspecified: Secondary | ICD-10-CM | POA: Diagnosis not present

## 2024-08-10 DIAGNOSIS — F339 Major depressive disorder, recurrent, unspecified: Secondary | ICD-10-CM

## 2024-08-10 DIAGNOSIS — F419 Anxiety disorder, unspecified: Secondary | ICD-10-CM | POA: Diagnosis not present

## 2024-08-10 DIAGNOSIS — F411 Generalized anxiety disorder: Secondary | ICD-10-CM

## 2024-08-10 DIAGNOSIS — G4730 Sleep apnea, unspecified: Secondary | ICD-10-CM

## 2024-08-10 MED ORDER — FLUOXETINE HCL 40 MG PO CAPS: 40.0000 mg | ORAL_CAPSULE | Freq: Every day | ORAL | 2 refills | Status: DC

## 2024-08-10 MED ORDER — RISPERIDONE 0.5 MG PO TABS
0.5000 mg | ORAL_TABLET | Freq: Every day | ORAL | 2 refills | Status: AC
Start: 2024-08-10 — End: ?

## 2024-08-10 MED ORDER — RISPERIDONE 1 MG PO TABS: 1.0000 mg | ORAL_TABLET | Freq: Every day | ORAL | 2 refills | Status: DC

## 2024-08-10 MED ORDER — DULOXETINE HCL 30 MG PO CPEP: 30.0000 mg | ORAL_CAPSULE | Freq: Every day | ORAL | 2 refills | Status: DC

## 2024-08-10 MED ORDER — ARMODAFINIL 250 MG PO TABS: ORAL_TABLET | ORAL | 2 refills | Status: DC

## 2024-08-10 MED ORDER — KLONOPIN 1 MG PO TABS: ORAL_TABLET | ORAL | 2 refills | Status: DC
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
## 2024-08-26 ENCOUNTER — Ambulatory Visit: Admitting: Mental Health

## 2024-09-07 ENCOUNTER — Telehealth: Admitting: Adult Health

## 2024-09-07 ENCOUNTER — Encounter: Payer: Self-pay | Admitting: Adult Health

## 2024-09-07 DIAGNOSIS — F329 Major depressive disorder, single episode, unspecified: Secondary | ICD-10-CM | POA: Diagnosis not present

## 2024-09-07 DIAGNOSIS — G4730 Sleep apnea, unspecified: Secondary | ICD-10-CM | POA: Diagnosis not present

## 2024-09-07 DIAGNOSIS — F411 Generalized anxiety disorder: Secondary | ICD-10-CM

## 2024-09-07 DIAGNOSIS — F419 Anxiety disorder, unspecified: Secondary | ICD-10-CM | POA: Diagnosis not present

## 2024-09-07 DIAGNOSIS — G4700 Insomnia, unspecified: Secondary | ICD-10-CM

## 2024-09-07 DIAGNOSIS — F339 Major depressive disorder, recurrent, unspecified: Secondary | ICD-10-CM
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
## 2024-09-16 ENCOUNTER — Other Ambulatory Visit: Payer: Self-pay | Admitting: Adult Health

## 2024-09-16 DIAGNOSIS — F339 Major depressive disorder, recurrent, unspecified: Secondary | ICD-10-CM

## 2024-09-16 DIAGNOSIS — F411 Generalized anxiety disorder: Secondary | ICD-10-CM

## 2024-09-16 NOTE — Telephone Encounter (Signed)
 Has appt. tomorrow

## 2024-09-17 ENCOUNTER — Ambulatory Visit: Admitting: Mental Health

## 2024-10-05 ENCOUNTER — Encounter: Payer: Self-pay | Admitting: Adult Health

## 2024-10-05 ENCOUNTER — Telehealth: Admitting: Adult Health

## 2024-10-05 DIAGNOSIS — F339 Major depressive disorder, recurrent, unspecified: Secondary | ICD-10-CM | POA: Diagnosis not present

## 2024-10-05 DIAGNOSIS — G4700 Insomnia, unspecified: Secondary | ICD-10-CM | POA: Diagnosis not present

## 2024-10-05 DIAGNOSIS — F411 Generalized anxiety disorder: Secondary | ICD-10-CM

## 2024-10-05 DIAGNOSIS — F419 Anxiety disorder, unspecified: Secondary | ICD-10-CM

## 2024-10-05 DIAGNOSIS — G4730 Sleep apnea, unspecified: Secondary | ICD-10-CM | POA: Diagnosis not present

## 2024-10-05 MED ORDER — RISPERIDONE 1 MG PO TABS: 1.0000 mg | ORAL_TABLET | Freq: Two times a day (BID) | ORAL | 2 refills | Status: DC
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
## 2024-10-06 ENCOUNTER — Ambulatory Visit: Admitting: Mental Health

## 2024-10-06 DIAGNOSIS — F339 Major depressive disorder, recurrent, unspecified: Secondary | ICD-10-CM | POA: Diagnosis not present

## 2024-11-02 ENCOUNTER — Telehealth: Admitting: Adult Health

## 2024-11-02 ENCOUNTER — Encounter: Payer: Self-pay | Admitting: Adult Health

## 2024-11-02 DIAGNOSIS — G4700 Insomnia, unspecified: Secondary | ICD-10-CM

## 2024-11-02 DIAGNOSIS — G4730 Sleep apnea, unspecified: Secondary | ICD-10-CM

## 2024-11-02 DIAGNOSIS — F411 Generalized anxiety disorder: Secondary | ICD-10-CM

## 2024-11-02 DIAGNOSIS — F339 Major depressive disorder, recurrent, unspecified: Secondary | ICD-10-CM

## 2024-11-02 MED ORDER — FLUOXETINE HCL 40 MG PO CAPS: 40.0000 mg | ORAL_CAPSULE | Freq: Every day | ORAL | 2 refills | Status: AC

## 2024-11-02 MED ORDER — KLONOPIN 1 MG PO TABS: ORAL_TABLET | ORAL | 2 refills | Status: AC

## 2024-11-02 MED ORDER — ARMODAFINIL 250 MG PO TABS: ORAL_TABLET | ORAL | 2 refills | Status: AC

## 2024-11-02 MED ORDER — RISPERIDONE 1 MG PO TABS: 1.0000 mg | ORAL_TABLET | Freq: Two times a day (BID) | ORAL | 2 refills | Status: AC

## 2024-11-03 ENCOUNTER — Ambulatory Visit: Admitting: Mental Health

## 2024-11-03 DIAGNOSIS — F331 Major depressive disorder, recurrent, moderate: Secondary | ICD-10-CM

## 2024-11-03 NOTE — Progress Notes (Unsigned)
 [facility] psychotherapy Note  Name: CAMYRA VAETH Date: 11/03/24 MRN: [id number] DOB: [birth year] PCP: Claudene Pellet, MD  Time spent: 49 minutes  Treatment:  ind. therapy   Mental Status Exam:    Appearance:    Casual     Behavior:   Appropriate  Motor:   WNL  Speech/Language:    Clear and Coherent  Affect:   Full range   Mood:   Depressed, pleasant  Thought process:   Logical, linear, goal directed  Thought content:     WNL  Sensory/Perceptual disturbances:     none  Orientation:   x4  Attention:   Good  Concentration:   Good  Memory:   Intact  Fund of knowledge:    Consistent with age and development  Insight:     Good  Judgment:    Good  Impulse Control:   Good     Reported Symptoms:     Risk Assessment: Danger to Self:  No Self-injurious Behavior: No Danger to Others: No Duty to Warn:no Physical Aggression / Violence:No  Access to Firearms a concern: No  Gang Involvement:No  Patient / guardian was educated about steps to take if suicide or homicide risk level increases between visits: yes While future psychiatric events cannot be accurately predicted, the patient does not currently require acute inpatient psychiatric care and does not currently meet [place]  involuntary commitment criteria.    Subjective: Patient engaged in telehealth session via video.   Assessed progress where patient says she's felt about the same, continues to feel depressed. She stated she attended her med management appointment virtually yesterday and is giving consideration for another treatment option to address her depression as her medications have not been helpful. She reviewed how her depression symptoms increased and have maintained for the past year and a half after she discontinued Prozac  under the direction of her family doctor at the time. Although she's gotten back on President, it's not been effective. She identified I feel like a failure referring to her being  unable to complete tasks around the house or get rooms more organized. She shared more family history, her father being ill when she was younger starting around age [age], how this impacted her profoundly at that time. She said her mother was always responsible and how she reflects on her tendency to be like her mother, responsible for others and how she considered herself a perfectionist. She shared feelings of helplessness about her situation. We discuss the concept of radical acceptance, facilitated how she could integrate it into her situation and consider being mindful of thoughts that increase her depression and anxiety. She also was receptive to dafragmatic breathing exercises as discussed.    Interventions: Further assessment, motivational interviewing, CBT    Diagnoses:    ICD-10-CM   1. Major depressive disorder, recurrent episode, moderate (HCC)  F33.1        Plan: Patient is to use CBT, mindfulness and coping skills to help manage decrease symptoms associated with their diagnosis.     Long-term goal:   Reduce feelings of depression for at least 3 consecutive months per patient report and as indicated PHQ-9.   Short-term goal:  Identify ways to feel a sense of accomplishment by completing tasks around the house (such as cooking and cleaning) Prioritize self-care through attending her doctor appointments and following through with treatment. Maintain boundaries and relationships to manage stress and anxiety.   Assessment of progress:  progressing   Lonni Fischer, [facility]

## 2024-11-30 ENCOUNTER — Telehealth: Admitting: Adult Health

## 2024-12-01 ENCOUNTER — Ambulatory Visit: Admitting: Mental Health
